# Patient Record
Sex: Male | Born: 1935
Health system: Southern US, Community
[De-identification: ages and names within clinical notes are randomized; demographics above are authoritative.]

## PROBLEM LIST (undated history)

## (undated) DIAGNOSIS — C801 Malignant (primary) neoplasm, unspecified: Secondary | ICD-10-CM

## (undated) DIAGNOSIS — R0600 Dyspnea, unspecified: Secondary | ICD-10-CM

## (undated) DIAGNOSIS — N189 Chronic kidney disease, unspecified: Secondary | ICD-10-CM

## (undated) DIAGNOSIS — M1990 Unspecified osteoarthritis, unspecified site: Secondary | ICD-10-CM

## (undated) DIAGNOSIS — I2510 Atherosclerotic heart disease of native coronary artery without angina pectoris: Secondary | ICD-10-CM

## (undated) DIAGNOSIS — F32A Depression, unspecified: Secondary | ICD-10-CM

## (undated) DIAGNOSIS — I10 Essential (primary) hypertension: Secondary | ICD-10-CM

## (undated) DIAGNOSIS — D649 Anemia, unspecified: Secondary | ICD-10-CM

## (undated) DIAGNOSIS — T148XXA Other injury of unspecified body region, initial encounter: Secondary | ICD-10-CM

## (undated) DIAGNOSIS — F419 Anxiety disorder, unspecified: Secondary | ICD-10-CM

## (undated) HISTORY — PX: CHOLECYSTECTOMY: SHX55

## (undated) HISTORY — PX: CORONARY ARTERY BYPASS GRAFT: SHX141

## (undated) HISTORY — PX: OTHER SURGICAL HISTORY: SHX169

## (undated) HISTORY — PX: PROSTATECTOMY: SHX69

---

## 2006-01-26 ENCOUNTER — Inpatient Hospital Stay (HOSPITAL_COMMUNITY)
Admission: AD | Admit: 2006-01-26 | Discharge: 2006-02-02 | Payer: Self-pay | Admitting: Thoracic Surgery (Cardiothoracic Vascular Surgery)

## 2016-02-28 HISTORY — PX: EYE SURGERY: SHX253

## 2016-09-27 ENCOUNTER — Other Ambulatory Visit (HOSPITAL_COMMUNITY): Payer: Self-pay | Admitting: Urology

## 2016-09-27 ENCOUNTER — Ambulatory Visit (HOSPITAL_COMMUNITY)
Admission: RE | Admit: 2016-09-27 | Discharge: 2016-09-27 | Disposition: A | Discharge status: Home / Self Care | Payer: Medicare Other | Source: Ambulatory Visit | Attending: Urology | Admitting: Urology

## 2016-09-27 DIAGNOSIS — D49511 Neoplasm of unspecified behavior of right kidney: Secondary | ICD-10-CM

## 2016-09-27 DIAGNOSIS — I700 Atherosclerosis of aorta: Secondary | ICD-10-CM | POA: Insufficient documentation

## 2016-09-29 ENCOUNTER — Other Ambulatory Visit: Payer: Self-pay | Admitting: Urology

## 2016-10-13 NOTE — Patient Instructions (Signed)
Wesley Snyder.  10/13/2016   Your procedure is scheduled on: 10/23/16  Report to [facility] Main  Entrance Take [place]  elevators to 3rd floor to  [place] at     1000 AM.    Call this number if you have problems the morning of surgery [phone]    Remember: ONLY 1 PERSON MAY GO WITH YOU TO SHORT STAY TO GET  READY MORNING OF Wesley Snyder.  Do not eat food or drink liquids :After Midnight.     Take these medicines the morning of surgery with A SIP OF WATER: none DO NOT TAKE ANY DIABETIC MEDICATIONS DAY OF YOUR SURGERY                               You may not have any metal on your body including hair pins and              piercings  Do not wear jewelry,  lotions, powders or perfumes, deodorant                         Men may shave face and neck.   Do not bring valuables to the hospital. [place].  Contacts, dentures or bridgework may not be worn into surgery.      Patients discharged the day of surgery will not be allowed to drive home.  Name and phone number of your driver:  Special Instructions: N/A              Please read over the following fact sheets you were given: _____________________________________________________________________            [facility] - Preparing for Surgery Before surgery, you can play an important role.  Because skin is not sterile, your skin needs to be as free of germs as possible.  You can reduce the number of germs on your skin by washing with CHG (chlorahexidine gluconate) soap before surgery.  CHG is an antiseptic cleaner which kills germs and bonds with the skin to continue killing germs even after washing. Please DO NOT use if you have an allergy to CHG or antibacterial soaps.  If your skin becomes reddened/irritated stop using the CHG and inform your nurse when you arrive at Short Stay. Do not shave (including legs and underarms) for at least 48 hours  prior to the first CHG shower.  You may shave your face/neck. Please follow these instructions carefully:  1.  Shower with CHG Soap the night before surgery and the  morning of Surgery.  2.  If you choose to wash your hair, wash your hair first as usual with your  normal  shampoo.  3.  After you shampoo, rinse your hair and body thoroughly to remove the  shampoo.                           4.  Use CHG as you would any other liquid soap.  You can apply chg directly  to the skin and wash                       Gently with a  scrungie or clean washcloth.  5.  Apply the CHG Soap to your body ONLY FROM THE NECK DOWN.   Do not use on face/ open                           Wound or open sores. Avoid contact with eyes, ears mouth and genitals (private parts).                       Wash face,  Genitals (private parts) with your normal soap.             6.  Wash thoroughly, paying special attention to the area where your surgery  will be performed.  7.  Thoroughly rinse your body with warm water from the neck down.  8.  DO NOT shower/wash with your normal soap after using and rinsing off  the CHG Soap.                9.  Pat yourself dry with a clean towel.            10.  Wear clean pajamas.            11.  Place clean sheets on your bed the night of your first shower and do not  sleep with pets. Day of Surgery : Do not apply any lotions/deodorants the morning of surgery.  Please wear clean clothes to the hospital/surgery center.  FAILURE TO FOLLOW THESE INSTRUCTIONS MAY RESULT IN THE CANCELLATION OF YOUR SURGERY PATIENT SIGNATURE_________________________________  NURSE SIGNATURE__________________________________  ________________________________________________________________________

## 2016-10-16 ENCOUNTER — Encounter (HOSPITAL_COMMUNITY)
Admission: RE | Admit: 2016-10-16 | Discharge: 2016-10-16 | Disposition: A | Discharge status: Home / Self Care | Payer: Medicare Other | Source: Ambulatory Visit | Attending: Urology | Admitting: Urology

## 2016-10-16 ENCOUNTER — Encounter (INDEPENDENT_AMBULATORY_CARE_PROVIDER_SITE_OTHER): Payer: Self-pay

## 2016-10-16 ENCOUNTER — Encounter (HOSPITAL_COMMUNITY): Payer: Self-pay

## 2016-10-16 DIAGNOSIS — Z01812 Encounter for preprocedural laboratory examination: Secondary | ICD-10-CM | POA: Insufficient documentation

## 2016-10-16 HISTORY — DX: Malignant (primary) neoplasm, unspecified: C80.1

## 2016-10-16 HISTORY — DX: Anxiety disorder, unspecified: F41.9

## 2016-10-16 HISTORY — DX: Essential (primary) hypertension: I10

## 2016-10-16 LAB — CBC
HCT: 41.8 % (ref 39.0–52.0)
Hemoglobin: 14.5 g/dL (ref 13.0–17.0)
MCH: 30.7 pg (ref 26.0–34.0)
MCHC: 34.7 g/dL (ref 30.0–36.0)
MCV: 88.6 fL (ref 78.0–100.0)
PLATELETS: 135 10*3/uL — AB (ref 150–400)
RBC: 4.72 MIL/uL (ref 4.22–5.81)
RDW: 13 % (ref 11.5–15.5)
WBC: 7 10*3/uL (ref 4.0–10.5)

## 2016-10-16 LAB — BASIC METABOLIC PANEL
Anion gap: 5 (ref 5–15)
BUN: 21 mg/dL — ABNORMAL HIGH (ref 6–20)
CALCIUM: 9.5 mg/dL (ref 8.9–10.3)
CO2: 25 mmol/L (ref 22–32)
CREATININE: 1.09 mg/dL (ref 0.61–1.24)
Chloride: 111 mmol/L (ref 101–111)
GFR calc non Af Amer: >60 mL/min (ref >60)
GLUCOSE: 93 mg/dL (ref 65–99)
Potassium: 4.4 mmol/L (ref 3.5–5.1)
Sodium: 141 mmol/L (ref 135–145)

## 2016-10-16 NOTE — Progress Notes (Signed)
Cbc done 10/16/16  routed to Dr. Alinda Money via epic

## 2016-10-16 NOTE — Progress Notes (Signed)
cxr 09/30/16 epic

## 2016-10-16 NOTE — Progress Notes (Signed)
Spoke with Wesley Snyder At Dr. Barnabas Lister painters office   [phone].  pt. Is to have a Left heart cath on 10/17/16 @ 1200 noon per Wesley Snyder she will fax results to [phone] . Pt. Surgery pending results of cath.

## 2016-10-18 NOTE — Progress Notes (Signed)
EKG-10/12/16- on chart from office of Dr Sharyon Cable and  Clearance note on chart from Dr Sharyon Cable and stated results of cardiac cath done on 10/17/2016 on clearance.

## 2016-10-20 NOTE — Progress Notes (Signed)
Called and spoke to wife to arrive in short stay at 9:30am use [employer] parking

## 2016-10-20 NOTE — H&P (Signed)
Office Visit Report     09/27/2016   --------------------------------------------------------------------------------   Wesley Snyder  MRN: [id number]  PRIMARY CARE:    DOB: 1932/08/27, 83 year old Male  REFERRING:  Aloha Gell. Hurt, MD  SSN: -[id number]  PROVIDER:  Raynelle Bring, M.D.    LOCATION:  [facility] 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 Roxanne Gates. His urologic history is significant for renal cell carcinoma status post left open radical nephrectomy at [facility] in 1994. He has not had any recurrences. He also has a history of prostate cancer status post a radical retropubic prostatectomy at [facility] in 1994 as well. Apparently, he has been on androgen deprivation therapy for biochemical recurrence for over 20 years. He received his last Lupron injection on 09/01/16.   More recently, he developed gross hematuria in June after he fell and landed on his right flank. His hematuria occurred 3 days after his fall. This was painless hematuria. He has denied any right sided flank pain, fevers, weight loss, or other complaints. His hematuria has now resolved.   He underwent an evaluation by Dr. hurt after a CT scan was performed and presumably demonstrated a questionable right renal mass. He therefore was scheduled for an MRI of the abdomen without contrast and this was performed on 09/12/16. These films were available for me to independently reviewed today and do indeed indicate a solitary right kidney with multiple small right renal cyst. There is an area measuring approximately 18 mm that appears solid and likely within the upper pole of the right kidney. It is difficult to further characterize this lesion considering the absence of IV contrast. The patient does not give a history of  chronic kidney disease but I also do not have any laboratory studies to objectively assess his renal function.   His past medical history is significant for history of coronary artery disease status post coronary artery bypass grafting in 2006. He apparently has not been followed by a cardiologist for many years. He denies any chest pain, shortness of breath, or palpitations recently.     ALLERGIES: None   MEDICATIONS: None   GU PSH: None   NON-GU PSH: Coronary Artery Bypass Grafting    GU PMH: None   NON-GU PMH: Heart disease, unspecified Sleep Apnea    FAMILY HISTORY: 1 Daughter - Runs in Family 2 sons - Runs in Family Cirrhosis - Father   SOCIAL HISTORY: Marital Status: Married Preferred Language: English; Ethnicity: Not Hispanic Or Latino; Race: White Current Smoking Status: Patient has never smoked.   Tobacco Use Assessment Completed: Used Tobacco in last 30 days? Has never drank.  Drinks 1 caffeinated drink per day.    REVIEW OF SYSTEMS:    GU Review Male:   Patient reports frequent urination, hard to postpone urination, get up at night to urinate, and leakage of urine. Patient denies burning/ pain with urination, stream starts and stops, trouble starting your streams, and have to strain to urinate .  Gastrointestinal (Lower):   Patient reports diarrhea. Patient denies constipation.  Gastrointestinal (Upper):   Patient denies nausea and vomiting.  Constitutional:   Patient reports weight loss. Patient denies fever, night sweats, and fatigue.  Skin:   Patient denies skin rash/ lesion and itching.  Eyes:   Patient reports blurred vision. Patient denies double vision.  Ears/  Nose/ Throat:   Patient denies sore throat and sinus problems.  Hematologic/Lymphatic:   Patient reports easy bruising. Patient denies swollen glands.  Cardiovascular:   Patient denies leg swelling and chest pains.  Respiratory:   Patient denies cough and shortness of breath.  Endocrine:   Patient  denies excessive thirst.  Musculoskeletal:   Patient reports joint pain. Patient denies back pain.  Neurological:   Patient denies headaches and dizziness.  Psychologic:   Patient denies anxiety and depression.   VITAL SIGNS:      09/27/2016 09:54 AM  Weight 180 lb / 81.65 kg  Height 71 in / 180.34 cm  BP 147/76 mmHg  Pulse 54 /min  BMI 25.1 kg/m   MULTI-SYSTEM PHYSICAL EXAMINATION:    Constitutional: Well-nourished. No physical deformities. Normally developed. Good grooming.  Neck: Neck symmetrical, not swollen. Normal tracheal position.  Respiratory: No labored breathing, no use of accessory muscles. Clear bilaterally.  Cardiovascular: Normal temperature, normal extremity pulses, no swelling, no varicosities. Regular rate and rhythm.  Lymphatic: No enlargement of neck, axillae, groin.  Skin: No paleness, no jaundice, no cyanosis. No lesion, no ulcer, no rash.  Neurologic / Psychiatric: Oriented to time, oriented to place, oriented to person. No depression, no anxiety, no agitation.  Gastrointestinal: No mass, no tenderness, no rigidity, non obese abdomen.  Eyes: Normal conjunctivae. Normal eyelids.  Ears, Nose, Mouth, and Throat: Left ear no scars, no lesions, no masses. Right ear no scars, no lesions, no masses. Nose no scars, no lesions, no masses. Normal hearing. Normal lips.  Musculoskeletal: Normal gait and station of head and neck.     PAST DATA REVIEWED:  Source Of History:  Patient  Records Review:   Previous Patient Records  Urine Test Review:   Urinalysis  X-Ray Review: C.T. Abdomen/Pelvis: Reviewed Report.  MRI Abdomen: Reviewed Films.     PROCEDURES:          Urinalysis w/Scope Dipstick Dipstick Cont'd Micro  Color: Straw Bilirubin: Neg WBC/hpf: NS (Not Seen)  Appearance: Cloudy Ketones: Neg RBC/hpf: 40 - 60/hpf  Specific Gravity: 1.010 Blood: 3+ Bacteria: NS (Not Seen)  pH: 6.0 Protein: Neg Cystals: NS (Not Seen)  Glucose: Neg Urobilinogen: 0.2 Casts: NS  (Not Seen)    Nitrites: Neg Trichomonas: Not Present    Leukocyte Esterase: Neg Mucous: Present      Epithelial Cells: 0 - 5/hpf      Yeast: NS (Not Seen)      Sperm: Not Present    ASSESSMENT:      ICD-10 Details  1 GU:   Right renal neoplasm - D49.511   2   Gross hematuria - R31.0   3   Prostate Cancer - C61   4   History of kidney cancer - Z85.528    PLAN:           Orders Labs CMP, PSA  X-Rays: Chest X-Ray Outside.          Schedule Return Visit/Planned Activity: Other See Visit Notes             Note: Will call to schedule surgery          Document Letter(s):  Created for Patient: Clinical Summary         Notes:   1. Gross hematuria/right renal neoplasm: I have recommended that he proceed with cystoscopy under anesthesia with right retrograde pyelography, right ureteroscopy, possible biopsy, possible laser ablation of right renal pelvic tumor, and ureteral stent placement. We discussed this  procedure in detail including the potential risks, complications, and expected recovery process. He gives informed consent to proceed.   His renal function will be checked today. He also will have a chest x-ray performed to evaluate for possible metastatic disease.   Considering his solitary right kidney, he understands we will do everything we can to proceed with renal sparing therapy.   2. Prostate cancer: It is unclear to me the reason that he is been on androgen deprivation therapy for close to 20 years. However, I do not have any of his records regarding his prostate cancer. Regardless, he did receive androgen deprivation therapy as recently as early July. He will continue his care under Dr. Lerry Liner.   3. Renal cell carcinoma: He has not had any evidence of recurrence.   Cc: Majel Homer, PA-C  Dr. Alain Marion    * Signed by Raynelle Bring, M.D. on 09/27/16 at 6:58 PM (EDT)*

## 2016-10-23 ENCOUNTER — Ambulatory Visit (HOSPITAL_COMMUNITY): Payer: Medicare Other | Admitting: Anesthesiology

## 2016-10-23 ENCOUNTER — Encounter (HOSPITAL_COMMUNITY)
Admission: RE | Disposition: A | Discharge status: Home / Self Care | Payer: Self-pay | Source: Ambulatory Visit | Attending: Urology

## 2016-10-23 ENCOUNTER — Encounter (HOSPITAL_COMMUNITY): Payer: Self-pay | Admitting: *Deleted

## 2016-10-23 ENCOUNTER — Ambulatory Visit (HOSPITAL_COMMUNITY)
Admission: RE | Admit: 2016-10-23 | Discharge: 2016-10-23 | Disposition: A | Discharge status: Home / Self Care | Payer: Medicare Other | Source: Ambulatory Visit | Attending: Urology | Admitting: Urology

## 2016-10-23 ENCOUNTER — Ambulatory Visit (HOSPITAL_COMMUNITY): Payer: Medicare Other

## 2016-10-23 DIAGNOSIS — G4730 Sleep apnea, unspecified: Secondary | ICD-10-CM | POA: Diagnosis not present

## 2016-10-23 DIAGNOSIS — Z87891 Personal history of nicotine dependence: Secondary | ICD-10-CM | POA: Insufficient documentation

## 2016-10-23 DIAGNOSIS — C651 Malignant neoplasm of right renal pelvis: Secondary | ICD-10-CM | POA: Diagnosis not present

## 2016-10-23 DIAGNOSIS — Z79899 Other long term (current) drug therapy: Secondary | ICD-10-CM | POA: Diagnosis not present

## 2016-10-23 DIAGNOSIS — I10 Essential (primary) hypertension: Secondary | ICD-10-CM | POA: Diagnosis not present

## 2016-10-23 DIAGNOSIS — I2510 Atherosclerotic heart disease of native coronary artery without angina pectoris: Secondary | ICD-10-CM | POA: Insufficient documentation

## 2016-10-23 DIAGNOSIS — Z951 Presence of aortocoronary bypass graft: Secondary | ICD-10-CM | POA: Insufficient documentation

## 2016-10-23 DIAGNOSIS — R310 Gross hematuria: Secondary | ICD-10-CM | POA: Diagnosis not present

## 2016-10-23 DIAGNOSIS — Z85528 Personal history of other malignant neoplasm of kidney: Secondary | ICD-10-CM | POA: Insufficient documentation

## 2016-10-23 DIAGNOSIS — Z8546 Personal history of malignant neoplasm of prostate: Secondary | ICD-10-CM | POA: Diagnosis not present

## 2016-10-23 HISTORY — PX: CYSTOSCOPY/RETROGRADE/URETEROSCOPY: SHX5316

## 2016-10-23 SURGERY — CYSTOSCOPY/RETROGRADE/URETEROSCOPY
Anesthesia: General | Laterality: Right

## 2016-10-23 MED ORDER — LACTATED RINGERS IV SOLN
INTRAVENOUS | Status: DC
  Administered 2016-10-23: 10:00:00 via INTRAVENOUS

## 2016-10-23 MED ORDER — LIDOCAINE 2% (20 MG/ML) 5 ML SYRINGE
INTRAMUSCULAR | Status: AC
  Filled 2016-10-23: qty 5

## 2016-10-23 MED ORDER — ONDANSETRON HCL 4 MG/2ML IJ SOLN
INTRAMUSCULAR | Status: AC
  Filled 2016-10-23: qty 2

## 2016-10-23 MED ORDER — PROPOFOL 10 MG/ML IV BOLUS
INTRAVENOUS | Status: DC | PRN
  Administered 2016-10-23: 170 mg via INTRAVENOUS

## 2016-10-23 MED ORDER — LIDOCAINE 2% (20 MG/ML) 5 ML SYRINGE
INTRAMUSCULAR | Status: DC | PRN
  Administered 2016-10-23: 40 mg via INTRAVENOUS

## 2016-10-23 MED ORDER — HYDROCODONE-ACETAMINOPHEN 5-325 MG PO TABS: 1.0000 | ORAL_TABLET | Freq: Four times a day (QID) | ORAL | 0 refills | Status: DC | PRN

## 2016-10-23 MED ORDER — PROPOFOL 10 MG/ML IV BOLUS
INTRAVENOUS | Status: AC
  Filled 2016-10-23: qty 20

## 2016-10-23 MED ORDER — LACTATED RINGERS IV SOLN: INTRAVENOUS | Status: DC

## 2016-10-23 MED ORDER — FENTANYL CITRATE (PF) 100 MCG/2ML IJ SOLN
INTRAMUSCULAR | Status: AC
  Filled 2016-10-23: qty 2

## 2016-10-23 MED ORDER — DEXAMETHASONE SODIUM PHOSPHATE 10 MG/ML IJ SOLN
INTRAMUSCULAR | Status: AC
  Filled 2016-10-23: qty 1

## 2016-10-23 MED ORDER — FENTANYL CITRATE (PF) 100 MCG/2ML IJ SOLN: 25.0000 ug | INTRAMUSCULAR | Status: DC | PRN

## 2016-10-23 MED ORDER — SULFAMETHOXAZOLE-TRIMETHOPRIM 800-160 MG PO TABS: 1.0000 | ORAL_TABLET | Freq: Two times a day (BID) | ORAL | 0 refills | Status: DC

## 2016-10-23 MED ORDER — SODIUM CHLORIDE 0.9 % IR SOLN
Status: DC | PRN
  Administered 2016-10-23: 6000 mL

## 2016-10-23 MED ORDER — EPHEDRINE 5 MG/ML INJ
INTRAVENOUS | Status: AC
  Filled 2016-10-23: qty 10

## 2016-10-23 MED ORDER — DEXAMETHASONE SODIUM PHOSPHATE 10 MG/ML IJ SOLN
INTRAMUSCULAR | Status: DC | PRN
Start: 2016-10-23 — End: 2016-10-23
  Administered 2016-10-23: 10 mg via INTRAVENOUS

## 2016-10-23 MED ORDER — EPHEDRINE SULFATE-NACL 50-0.9 MG/10ML-% IV SOSY
PREFILLED_SYRINGE | INTRAVENOUS | Status: DC | PRN
  Administered 2016-10-23: 20 mg via INTRAVENOUS

## 2016-10-23 MED ORDER — FENTANYL CITRATE (PF) 100 MCG/2ML IJ SOLN
INTRAMUSCULAR | Status: DC | PRN
  Administered 2016-10-23 (×2): 25 ug via INTRAVENOUS
  Administered 2016-10-23: 50 ug via INTRAVENOUS

## 2016-10-23 MED ORDER — PHENAZOPYRIDINE HCL 100 MG PO TABS: 100.0000 mg | ORAL_TABLET | Freq: Three times a day (TID) | ORAL | 0 refills | Status: DC | PRN

## 2016-10-23 MED ORDER — PROMETHAZINE HCL 25 MG/ML IJ SOLN: 6.2500 mg | INTRAMUSCULAR | Status: DC | PRN

## 2016-10-23 MED ORDER — CEFAZOLIN SODIUM-DEXTROSE 2-4 GM/100ML-% IV SOLN
2.0000 g | INTRAVENOUS | Status: AC
  Administered 2016-10-23: 2 g via INTRAVENOUS
  Filled 2016-10-23: qty 100

## 2016-10-23 MED ORDER — ONDANSETRON HCL 4 MG/2ML IJ SOLN
INTRAMUSCULAR | Status: DC | PRN
Start: 2016-10-23 — End: 2016-10-23
  Administered 2016-10-23: 4 mg via INTRAVENOUS

## 2016-10-23 SURGICAL SUPPLY — 20 items
BAG URO CATCHER STRL LF (MISCELLANEOUS) ×3 IMPLANT
BASKET STNLS GEMINI 4WIRE 3FR (BASKET) ×3 IMPLANT
BSKT STON RTRVL GEM 120X11 3FR (BASKET) ×1
CATH INTERMIT  6FR 70CM (CATHETERS) ×3 IMPLANT
CLOTH BEACON ORANGE TIMEOUT ST (SAFETY) ×3 IMPLANT
COVER FOOT SWITCH UNIV DISP (DRAPES) IMPLANT
COVER SURGICAL LIGHT HANDLE (MISCELLANEOUS) ×3 IMPLANT
FIBER LASER TRAC TIP (UROLOGICAL SUPPLIES) ×2 IMPLANT
GLOVE BIOGEL M STRL SZ7.5 (GLOVE) ×3 IMPLANT
GOWN STRL REUS W/TWL LRG LVL3 (GOWN DISPOSABLE) ×6 IMPLANT
GUIDEWIRE ANG ZIPWIRE 038X150 (WIRE) ×2 IMPLANT
GUIDEWIRE STR DUAL SENSOR (WIRE) ×3 IMPLANT
IV NS 1000ML (IV SOLUTION) ×3
IV NS 1000ML BAXH (IV SOLUTION) ×1 IMPLANT
MANIFOLD NEPTUNE II (INSTRUMENTS) ×3 IMPLANT
PACK CYSTO (CUSTOM PROCEDURE TRAY) ×3 IMPLANT
SHEATH ACCESS URETERAL 38CM (SHEATH) ×2 IMPLANT
STENT CONTOUR 6FRX26X.038 (STENTS) ×3 IMPLANT
TUBING CONNECTING 10 (TUBING) ×2 IMPLANT
TUBING CONNECTING 10' (TUBING) ×1

## 2016-10-23 NOTE — Interval H&P Note (Signed)
History and Physical Interval Note:  10/23/2016 10:26 AM  Wesley Snyder.  has presented today for surgery, with the diagnosis of RIGHT RENAL NEOPLASM, HEMATURIA  The various methods of treatment have been discussed with the patient and family. After consideration of risks, benefits and other options for treatment, the patient has consented to  Procedure(s): CYSTOSCOPY/RETROGRADE/URETEROSCOPY/STENT/ BIOPSY/ HOLMIUM LASER/ ABLATION OF TUMOR (Right) as a surgical intervention .  The patient's history has been reviewed, patient examined, no change in status, stable for surgery.  I have reviewed the patient's chart and labs.  Questions were answered to the patient's satisfaction.     Mackena Plummer,LES

## 2016-10-23 NOTE — Transfer of Care (Signed)
Immediate Anesthesia Transfer of Care Note  Patient: Wesley Snyder.  Procedure(s) Performed: Procedure(s): CYSTOSCOPY/RETROGRADE/URETEROSCOPY/STENT/ BIOPSY/ HOLMIUM LASER/ ABLATION OF TUMOR (Right)  Patient Location: PACU  Anesthesia Type:General  Level of Consciousness:  sedated, patient cooperative and responds to stimulation  Airway & Oxygen Therapy:Patient Spontanous Breathing and Patient connected to face mask oxgen  Post-op Assessment:  Report given to PACU RN and Post -op Vital signs reviewed and stable  Post vital signs:  Reviewed and stable  Last Vitals:  Vitals:   10/23/16 0913  BP: (!) 162/73  Pulse: (!) 58  Resp: 16  Temp: 36.6 C  SpO2: 10%    Complications: No apparent anesthesia complications

## 2016-10-23 NOTE — Anesthesia Procedure Notes (Signed)
Procedure Name: LMA Insertion Date/Time: 10/23/2016 10:54 AM Performed by: Talbot Grumbling Pre-anesthesia Checklist: Emergency Drugs available, Suction available, Patient being monitored and Patient identified Patient Re-evaluated:Patient Re-evaluated prior to induction Oxygen Delivery Method: Circle system utilized Preoxygenation: Pre-oxygenation with 100% oxygen Induction Type: IV induction Ventilation: Mask ventilation without difficulty LMA: LMA inserted LMA Size: 4.0 Number of attempts: 1 Placement Confirmation: positive ETCO2 and breath sounds checked- equal and bilateral Tube secured with: Tape Dental Injury: Teeth and Oropharynx as per pre-operative assessment

## 2016-10-23 NOTE — Anesthesia Preprocedure Evaluation (Addendum)
Anesthesia Evaluation  Patient identified by MRN, date of birth, ID band Patient awake    Reviewed: Allergy & Precautions, NPO status , Patient's Chart, lab work & pertinent test results  Airway Mallampati: II  TM Distance: >3 FB Neck ROM: Full    Dental no notable dental hx.    Pulmonary sleep apnea , former smoker,    Pulmonary exam normal breath sounds clear to auscultation       Cardiovascular hypertension, + CAD and + CABG  Normal cardiovascular exam Rhythm:Regular Rate:Normal     Neuro/Psych negative neurological ROS  negative psych ROS   GI/Hepatic negative GI ROS, Neg liver ROS,   Endo/Other  negative endocrine ROS  Renal/GU negative Renal ROS  negative genitourinary   Musculoskeletal negative musculoskeletal ROS (+)   Abdominal   Peds negative pediatric ROS (+)  Hematology negative hematology ROS (+)   Anesthesia Other Findings   Reproductive/Obstetrics negative OB ROS                             Anesthesia Physical Anesthesia Plan  ASA: III  Anesthesia Plan: General   Post-op Pain Management:    Induction: Intravenous  PONV Risk Score and Plan: 2 and Ondansetron and Dexamethasone  Airway Management Planned: LMA  Additional Equipment:   Intra-op Plan:   Post-operative Plan: Extubation in OR  Informed Consent: I have reviewed the patients History and Physical, chart, labs and discussed the procedure including the risks, benefits and alternatives for the proposed anesthesia with the patient or authorized representative who has indicated his/her understanding and acceptance.   Dental advisory given  Plan Discussed with: CRNA and Surgeon  Anesthesia Plan Comments:        Anesthesia Quick Evaluation

## 2016-10-23 NOTE — Discharge Instructions (Signed)

## 2016-10-23 NOTE — Anesthesia Postprocedure Evaluation (Signed)
Anesthesia Post Note  Patient: Wesley Snyder.  Procedure(s) Performed: Procedure(s) (LRB): CYSTOSCOPY/RETROGRADE/URETEROSCOPY/STENT/ BIOPSY/ HOLMIUM LASER/ ABLATION OF TUMOR (Right)     Patient location during evaluation: PACU Anesthesia Type: General Level of consciousness: awake and alert Pain management: pain level controlled Vital Signs Assessment: post-procedure vital signs reviewed and stable Respiratory status: spontaneous breathing, nonlabored ventilation, respiratory function stable and patient connected to nasal cannula oxygen Cardiovascular status: blood pressure returned to baseline and stable Postop Assessment: no signs of nausea or vomiting Anesthetic complications: no    Last Vitals:  Vitals:   10/23/16 1300 10/23/16 1316  BP: (!) 145/72 (!) 145/93  Pulse: 69 67  Resp: 18 16  Temp:  36.4 C  SpO2: 96% 98%    Last Pain:  Vitals:   10/23/16 0913  TempSrc: Oral                 Anntionette Madkins S

## 2016-10-23 NOTE — Op Note (Signed)
Preoperative diagnosis: Right renal pelvic tumor and hematuria  Postoperative diagnosis: Right renal pelvic tumor and hematuria  Procedures: 1.  Cystoscopy 2.  Right retrograde pyelography with interpretation 3.  Right ureteroscopy 4.  Biopsy of right renal pelvic tumor 5.  Holmium laser ablation of right renal pelvic tumor 6.  Right ureteral stent placement (6 x 26 - no string)  Surgeon: Pryor Curia. M.D.  Anesthesia: General  Complications: None  EBL: Minimal  Specimens: 1.  Right renal pelvic cytology 2.  Biopsy of right renal pelvic tumor 3.  Right renal pelvic tumor (2nd specimen)  Disposition of specimens: To pathology  Intraoperative findings: Right retrograde pyelography was performed with a 6 French ureteral catheter and Omnipaque contrast.  This revealed a normal caliber ureter without ureteral filling defects.  The right renal collecting system was significant for an amputated upper pole calyx concerning for possible tumor involvement.  Indication: Wesley Snyder is an 83 year old gentleman with a right solitary kidney status post left nephrectomy in the distant past for renal cell carcinoma.  He recently developed hematuria and had an MRI performed demonstrated a questionable mass in the upper pole of the right renal collecting system.  He presents today for further evaluation and the above procedures.  The potential risks, complications, and expected recovery process associated with these procedures has been discussed in detail.  He gives informed consent to proceed.  Description of procedure: The patient was taken to the operating room and a general anesthetic was administered.  He was given preoperative antibiotics, placed in the dorsal lithotomy position, and prepped and draped in the usual sterile fashion.  Next, a preoperative timeout was performed.  Cystourethroscopy was performed.  The prostatic urethra was surgically absent with his history of radical  prostatectomy.  Inspection of the bladder revealed no obvious tumors, stones, or other mucosal pathology.  Attention then turned to the right ureteral orifice and this was intubated with a 6 French ureteral catheter.  Omnipaque contrast was injected that revealed normal caliber ureter without filling defects or other abnormalities.  However, the right upper pole calyx was amputated concerning for possible tumor.  A 0.038 Sensor guidewire was then advanced up the right ureter into the right renal collecting system under fluoroscopic guidance.  A 12/14 ureteral access sheath was then advanced over the wire up into the proximal ureter.  Digital flexible ureteroscopy was then performed and the entire proximal ureter and renal collecting system were carefully examined.  There was noted to be an obvious papillary tumor in the upper pole of the right kidney.  A Gemini basket was placed through the ureteroscope and 2 biopsy specimens were obtained and sent for permanent analysis.  A saline washing was also obtained for cytology.  Using a 200  holmium laser fiber, laser ablation of the tumor was then performed.  I had discussed this possibility prior to the procedure with the patient.  Although we did not have definite pathology confirmation whether this was a high-grade or low-grade tumor, considering his solitary kidney and advanced age, he adamantly did not wish to proceed with nephroureterectomy which would result in end-stage renal disease and necessitate dialysis.  He therefore wished to proceed with local endoscopic therapy if possible.  The majority of the tumor was therefore laser ablated on a setting of 1 J and 6 Hz.  once the tumor had been ablated to the point where visualization became difficult, the procedure was ended.  The ureteral access sheath was removed  and the wire was left in place.  The wire was then back loaded over the cystoscope and a 6 x 26 double-J ureteral stent was advanced under fluoroscopic  guidance and positioned properly under fluoroscopy and cystoscopy.  The patient's bladder was ended and the procedure was completed.  He tolerated the procedure well without complications.  He was able to be awakened and transferred to the recovery unit in satisfactory condition.

## 2016-11-13 ENCOUNTER — Other Ambulatory Visit: Payer: Self-pay | Admitting: Urology

## 2016-11-14 ENCOUNTER — Encounter (HOSPITAL_COMMUNITY): Payer: Self-pay | Admitting: *Deleted

## 2016-11-15 NOTE — H&P (Signed)
Office Visit Report     11/07/2016   --------------------------------------------------------------------------------   Wesley Snyder  MRN: [id number]  PRIMARY CARE:    DOB: November 08, 1932, 83 year old Male  REFERRING:  Luvenia Redden  SSN: -[id number]  PROVIDER:  Raynelle Bring, M.D.    LOCATION:  [facility] [phone]   --------------------------------------------------------------------------------   CC/HPI: High-grade urothelial carcinoma of the right renal pelvis   [name] follows up today after his recent ureteroscopy. He was confirmed to have a papillary tumor in the upper pole of the right kidney urothelium. His biopsy results indicated a high-grade urothelial carcinoma. He did undergo ureteroscopic laser ablation of this tumor during that procedure with the goal of renal preservation considering a solitary right kidney and history of left nephrectomy. He states that he has recovered well. He denies any gross hematuria. He has been tolerating his right ureteral stent well.     ALLERGIES: None   MEDICATIONS: None   GU PSH: Cysto Uretero W/excise Tumor, Right - 10/23/2016 Cystoscopy Insert Stent, Right - 10/23/2016    NON-GU PSH: Coronary Artery Bypass Grafting    GU PMH: Gross hematuria - 09/27/2016 History of kidney cancer - 09/27/2016 Prostate Cancer - 09/27/2016 Right renal neoplasm - 09/27/2016      PMH Notes:   1) Renal cell carcinoma: He is s/p an open left radical nephrectomy at [facility] in 1994.   2) Prostate cancer: He is s/p an open RRP at [facility] in 1994. He apparently had a biochemical recurrence soon after primary therapy and has been on systemic therapy with ADT since then under his urologist in [place].   3) Urothelial carcinoma of the right kidney: He was found to have a right renal collecting system tumor on imaging in 2018 in his solitary right kidney. This was confirmed on ureteroscopic evaluation to be high grade  urothelial carcinoma.      NON-GU PMH: Coronary Artery Disease Sleep Apnea    FAMILY HISTORY: 1 Daughter - Runs in Family 2 sons - Runs in Family Cirrhosis - Father   SOCIAL HISTORY: Marital Status: Married Preferred Language: English; Ethnicity: Not Hispanic Or Latino; Race: White Current Smoking Status: Patient has never smoked.   Tobacco Use Assessment Completed: Used Tobacco in last 30 days? Has never drank.  Drinks 1 caffeinated drink per day.    REVIEW OF SYSTEMS:    GU Review Male:   Patient reports frequent urination, get up at night to urinate, and leakage of urine. Patient denies hard to postpone urination, burning/ pain with urination, stream starts and stops, trouble starting your streams, and have to strain to urinate .  Gastrointestinal (Lower):   Patient reports diarrhea. Patient denies constipation.  Gastrointestinal (Upper):   Patient denies nausea and vomiting.  Constitutional:   Patient denies fever, night sweats, weight loss, and fatigue.  Skin:   Patient denies skin rash/ lesion and itching.  Eyes:   Patient denies double vision and blurred vision.  Ears/ Nose/ Throat:   Patient denies sore throat and sinus problems.  Hematologic/Lymphatic:   Patient reports easy bruising. Patient denies swollen glands.  Cardiovascular:   Patient denies leg swelling and chest pains.  Respiratory:   Patient denies cough and shortness of breath.  Endocrine:   Patient denies excessive thirst.  Musculoskeletal:   Patient denies back pain and joint pain.  Neurological:   Patient denies headaches and dizziness.  Psychologic:   Patient denies depression and anxiety.  VITAL SIGNS:      11/07/2016 10:41 AM  Weight 175 lb / 79.38 kg  Height 71 in / 180.34 cm  BP 156/76 mmHg  Pulse 55 /min  BMI 24.4 kg/m   MULTI-SYSTEM PHYSICAL EXAMINATION:    Constitutional: Well-nourished. No physical deformities. Normally developed. Good grooming.  Respiratory: No labored breathing, no  use of accessory muscles. Clear bilaterally.  Cardiovascular: Normal temperature, normal extremity pulses, no swelling, no varicosities. Regular rate and rhythm.  Neurologic / Psychiatric: Oriented to time, oriented to place, oriented to person. No depression, no anxiety, no agitation.  Gastrointestinal: No mass, no tenderness, no rigidity, non obese abdomen. No CVA tenderness.     PAST DATA REVIEWED:  Source Of History:  Patient  Records Review:   Pathology Reports  Urine Test Review:   Urinalysis   09/27/16  PSA  Total PSA <0.015 ng/dL   Notes:                     Urine will be cultured.   PROCEDURES:          Urinalysis w/Scope Dipstick Dipstick Cont'd Micro  Color: Amber Bilirubin: Neg WBC/hpf: 0 - 5/hpf  Appearance: Cloudy Ketones: Neg RBC/hpf: >60/hpf  Specific Gravity: 1.020 Blood: 3+ Bacteria: Few (10-25/hpf)  pH: 6.0 Protein: 1+ Cystals: NS (Not Seen)  Glucose: Neg Urobilinogen: 0.2 Casts: NS (Not Seen)    Nitrites: Neg Trichomonas: Not Present    Leukocyte Esterase: 1+ Mucous: Not Present      Epithelial Cells: 0 - 5/hpf      Yeast: NS (Not Seen)      Sperm: Not Present    ASSESSMENT:      ICD-10 Details  1 GU:   Gross hematuria - R31.0   2   Renal pelvis cancer, right - C65.1    PLAN:           Orders Labs Urine Culture          Schedule Return Visit/Planned Activity: Other See Visit Notes             Note: Will call to schedule surgery          Document Letter(s):  Created for Patient: Clinical Summary         Notes:   1. High-grade urothelial carcinoma of the right renal collecting system: We reviewed his pathology report today that confirmed high-grade urothelial carcinoma of his solitary right kidney. We reviewed options which would include right nephroureterectomy as optimal cancer treatment albeit something that would also require him to be on dialysis considering his solitary right kidney. He is not very interested in considering a  nephroureterectomy based on this concern at age 75. We therefore reviewed alternatives including continued endoscopic management. I recommended that we consider repeating his ureteroscopy with further laser ablation if indicated. He understands that it is impossible for me to know if we have completely eradicated his tumor following his last procedure is visualization did become somewhat obscured toward the end of the procedure. Considering that his hematuria has resolved, it would be appropriate to now proceed with additional endoscopic evaluation and treatment. We therefore discussed proceeding with cystoscopy, right ureteroscopy, and laser ablation of remaining tumor. We reviewed this procedure in detail including the potential risks and complications and the expected recovery process. He gives informed consent to proceed.   He understands a continued endoscopic management does increase the risk that he could develop metastatic disease as it is difficult to  fully stage this tumor to determine whether there may be an invasive component. We did discuss the option of proceeding with BCG therapy with an indwelling ureteral stent to allow the BCG to reflux into the renal collecting system. We will discuss this further in the future.   2. Prostate cancer: He did ask me about the need for continued androgen deprivation. His PSA has remained undetectable and he has been on androgen deprivation therapy for over 20 years. Although I do not have his initial pathology information, I did discuss with him that it would seem to be reasonable to discontinue androgen deprivation with continued close monitoring of his PSA. We'll give this further thought and is likely to discuss this with Dr. Lerry Liner in more detail.   Cc: Dr. Alain Marion  Majel Homer, PA-C    * Signed by Raynelle Bring, M.D. on 11/07/16 at 9:10 PM (EDT)*

## 2016-11-16 ENCOUNTER — Ambulatory Visit (HOSPITAL_COMMUNITY): Payer: Medicare Other | Admitting: Anesthesiology

## 2016-11-16 ENCOUNTER — Ambulatory Visit (HOSPITAL_COMMUNITY)
Admission: RE | Admit: 2016-11-16 | Discharge: 2016-11-16 | Disposition: A | Discharge status: Home / Self Care | Payer: Medicare Other | Source: Ambulatory Visit | Attending: Urology | Admitting: Urology

## 2016-11-16 ENCOUNTER — Ambulatory Visit (HOSPITAL_COMMUNITY): Payer: Medicare Other

## 2016-11-16 ENCOUNTER — Encounter (HOSPITAL_COMMUNITY): Payer: Self-pay | Admitting: Emergency Medicine

## 2016-11-16 ENCOUNTER — Encounter (HOSPITAL_COMMUNITY)
Admission: RE | Disposition: A | Discharge status: Home / Self Care | Payer: Self-pay | Source: Ambulatory Visit | Attending: Urology

## 2016-11-16 DIAGNOSIS — Z87891 Personal history of nicotine dependence: Secondary | ICD-10-CM | POA: Diagnosis not present

## 2016-11-16 DIAGNOSIS — C651 Malignant neoplasm of right renal pelvis: Secondary | ICD-10-CM | POA: Insufficient documentation

## 2016-11-16 DIAGNOSIS — I10 Essential (primary) hypertension: Secondary | ICD-10-CM | POA: Insufficient documentation

## 2016-11-16 DIAGNOSIS — G4730 Sleep apnea, unspecified: Secondary | ICD-10-CM | POA: Diagnosis not present

## 2016-11-16 DIAGNOSIS — I2510 Atherosclerotic heart disease of native coronary artery without angina pectoris: Secondary | ICD-10-CM | POA: Diagnosis not present

## 2016-11-16 DIAGNOSIS — Z79899 Other long term (current) drug therapy: Secondary | ICD-10-CM | POA: Insufficient documentation

## 2016-11-16 DIAGNOSIS — Z8546 Personal history of malignant neoplasm of prostate: Secondary | ICD-10-CM | POA: Diagnosis not present

## 2016-11-16 DIAGNOSIS — Z951 Presence of aortocoronary bypass graft: Secondary | ICD-10-CM | POA: Insufficient documentation

## 2016-11-16 HISTORY — PX: CYSTOSCOPY WITH FULGERATION: SHX6638

## 2016-11-16 HISTORY — PX: CYSTOSCOPY WITH URETEROSCOPY AND STENT PLACEMENT: SHX6377

## 2016-11-16 LAB — CBC
HEMATOCRIT: 37.8 % — AB (ref 39.0–52.0)
Hemoglobin: 13 g/dL (ref 13.0–17.0)
MCH: 30.2 pg (ref 26.0–34.0)
MCHC: 34.4 g/dL (ref 30.0–36.0)
MCV: 87.7 fL (ref 78.0–100.0)
Platelets: 111 10*3/uL — ABNORMAL LOW (ref 150–400)
RBC: 4.31 MIL/uL (ref 4.22–5.81)
RDW: 12.8 % (ref 11.5–15.5)
WBC: 6.9 10*3/uL (ref 4.0–10.5)

## 2016-11-16 LAB — BASIC METABOLIC PANEL
Anion gap: 9 (ref 5–15)
BUN: 22 mg/dL — AB (ref 6–20)
CO2: 23 mmol/L (ref 22–32)
CREATININE: 1.3 mg/dL — AB (ref 0.61–1.24)
Calcium: 9.3 mg/dL (ref 8.9–10.3)
Chloride: 109 mmol/L (ref 101–111)
GFR, EST AFRICAN AMERICAN: 58 mL/min — AB (ref >60)
GFR, EST NON AFRICAN AMERICAN: 50 mL/min — AB (ref >60)
Glucose, Bld: 90 mg/dL (ref 65–99)
POTASSIUM: 4.3 mmol/L (ref 3.5–5.1)
SODIUM: 141 mmol/L (ref 135–145)

## 2016-11-16 SURGERY — CYSTOSCOPY, WITH BLADDER FULGURATION
Anesthesia: General

## 2016-11-16 MED ORDER — FENTANYL CITRATE (PF) 100 MCG/2ML IJ SOLN
INTRAMUSCULAR | Status: AC
  Filled 2016-11-16: qty 2

## 2016-11-16 MED ORDER — PROPOFOL 10 MG/ML IV BOLUS
INTRAVENOUS | Status: DC | PRN
  Administered 2016-11-16: 160 mg via INTRAVENOUS

## 2016-11-16 MED ORDER — PROPOFOL 10 MG/ML IV BOLUS
INTRAVENOUS | Status: AC
  Filled 2016-11-16: qty 20

## 2016-11-16 MED ORDER — EPHEDRINE SULFATE 50 MG/ML IJ SOLN
INTRAMUSCULAR | Status: DC | PRN
  Administered 2016-11-16: 5 mg via INTRAVENOUS

## 2016-11-16 MED ORDER — DEXAMETHASONE SODIUM PHOSPHATE 10 MG/ML IJ SOLN
INTRAMUSCULAR | Status: DC | PRN
  Administered 2016-11-16: 10 mg via INTRAVENOUS

## 2016-11-16 MED ORDER — ONDANSETRON HCL 4 MG/2ML IJ SOLN
INTRAMUSCULAR | Status: DC | PRN
  Administered 2016-11-16: 4 mg via INTRAVENOUS

## 2016-11-16 MED ORDER — FENTANYL CITRATE (PF) 100 MCG/2ML IJ SOLN: 25.0000 ug | INTRAMUSCULAR | Status: DC | PRN

## 2016-11-16 MED ORDER — LIDOCAINE 2% (20 MG/ML) 5 ML SYRINGE
INTRAMUSCULAR | Status: DC | PRN
  Administered 2016-11-16: 60 mg via INTRAVENOUS

## 2016-11-16 MED ORDER — PHENAZOPYRIDINE HCL 100 MG PO TABS: 100.0000 mg | ORAL_TABLET | Freq: Three times a day (TID) | ORAL | 0 refills | Status: DC | PRN

## 2016-11-16 MED ORDER — ONDANSETRON HCL 4 MG/2ML IJ SOLN: 4.0000 mg | Freq: Once | INTRAMUSCULAR | Status: DC | PRN

## 2016-11-16 MED ORDER — ONDANSETRON HCL 4 MG/2ML IJ SOLN
INTRAMUSCULAR | Status: AC
  Filled 2016-11-16: qty 2

## 2016-11-16 MED ORDER — LACTATED RINGERS IV SOLN
INTRAVENOUS | Status: DC
  Administered 2016-11-16: 11:00:00 via INTRAVENOUS

## 2016-11-16 MED ORDER — CIPROFLOXACIN IN D5W 400 MG/200ML IV SOLN
400.0000 mg | Freq: Once | INTRAVENOUS | Status: AC
  Administered 2016-11-16: 400 mg via INTRAVENOUS
  Filled 2016-11-16: qty 200

## 2016-11-16 MED ORDER — SODIUM CHLORIDE 0.9 % IR SOLN
Status: DC | PRN
  Administered 2016-11-16: 6000 mL

## 2016-11-16 MED ORDER — LIDOCAINE 2% (20 MG/ML) 5 ML SYRINGE
INTRAMUSCULAR | Status: AC
  Filled 2016-11-16: qty 5

## 2016-11-16 MED ORDER — FENTANYL CITRATE (PF) 100 MCG/2ML IJ SOLN
INTRAMUSCULAR | Status: DC | PRN
  Administered 2016-11-16: 25 ug via INTRAVENOUS

## 2016-11-16 MED ORDER — DEXAMETHASONE SODIUM PHOSPHATE 10 MG/ML IJ SOLN
INTRAMUSCULAR | Status: AC
  Filled 2016-11-16: qty 1

## 2016-11-16 MED ORDER — EPHEDRINE 5 MG/ML INJ
INTRAVENOUS | Status: AC
  Filled 2016-11-16: qty 10

## 2016-11-16 SURGICAL SUPPLY — 26 items
BAG URINE DRAINAGE (UROLOGICAL SUPPLIES) IMPLANT
BAG URO CATCHER STRL LF (MISCELLANEOUS) ×3 IMPLANT
BASKET ZERO TIP NITINOL 2.4FR (BASKET) IMPLANT
BSKT STON RTRVL ZERO TP 2.4FR (BASKET)
CATH INTERMIT  6FR 70CM (CATHETERS) ×2 IMPLANT
CLOTH BEACON ORANGE TIMEOUT ST (SAFETY) ×3 IMPLANT
COVER FOOTSWITCH UNIV (MISCELLANEOUS) ×2 IMPLANT
COVER SURGICAL LIGHT HANDLE (MISCELLANEOUS) ×1 IMPLANT
ELECT REM PT RETURN 15FT ADLT (MISCELLANEOUS) ×3 IMPLANT
FIBER LASER FLEXIVA 365 (UROLOGICAL SUPPLIES) IMPLANT
FIBER LASER TRAC TIP (UROLOGICAL SUPPLIES) ×2 IMPLANT
GLOVE BIOGEL M STRL SZ7.5 (GLOVE) ×3 IMPLANT
GOWN STRL REUS W/TWL LRG LVL3 (GOWN DISPOSABLE) ×6 IMPLANT
GUIDEWIRE ANG ZIPWIRE 038X150 (WIRE) IMPLANT
GUIDEWIRE STR DUAL SENSOR (WIRE) ×3 IMPLANT
IV NS 1000ML (IV SOLUTION)
IV NS 1000ML BAXH (IV SOLUTION) ×1 IMPLANT
LOOP CUT BIPOLAR 24F LRG (ELECTROSURGICAL) IMPLANT
MANIFOLD NEPTUNE II (INSTRUMENTS) ×3 IMPLANT
PACK CYSTO (CUSTOM PROCEDURE TRAY) ×3 IMPLANT
SET ASPIRATION TUBING (TUBING) IMPLANT
SHEATH ACCESS URETERAL 38CM (SHEATH) ×2 IMPLANT
STENT CONTOUR 6FRX26X.038 (STENTS) ×2 IMPLANT
SYRINGE IRR TOOMEY STRL 70CC (SYRINGE) IMPLANT
TUBING CONNECTING 10 (TUBING) ×2 IMPLANT
TUBING CONNECTING 10' (TUBING) ×1

## 2016-11-16 NOTE — Anesthesia Preprocedure Evaluation (Addendum)
Anesthesia Evaluation  Patient identified by MRN, date of birth, ID band Patient awake    Reviewed: Allergy & Precautions, NPO status , Patient's Chart, lab work & pertinent test results  Airway Mallampati: II  TM Distance: >3 FB Neck ROM: Full    Dental  (+) Dental Advisory Given, Chipped   Pulmonary sleep apnea , former smoker,    Pulmonary exam normal breath sounds clear to auscultation       Cardiovascular hypertension, + CAD and + CABG  Normal cardiovascular exam Rhythm:Regular Rate:Normal     Neuro/Psych Anxiety negative neurological ROS     GI/Hepatic negative GI ROS, Neg liver ROS,   Endo/Other  negative endocrine ROS  Renal/GU S/p left nephrectomy for renal cancer, right kidney urothelial cancer   Prostate cancer negative genitourinary   Musculoskeletal negative musculoskeletal ROS (+)   Abdominal   Peds negative pediatric ROS (+)  Hematology negative hematology ROS (+)   Anesthesia Other Findings   Reproductive/Obstetrics negative OB ROS                            Anesthesia Physical  Anesthesia Plan  ASA: III  Anesthesia Plan: General   Post-op Pain Management:    Induction: Intravenous  PONV Risk Score and Plan: 3 and Ondansetron, Dexamethasone, Propofol infusion and Treatment may vary due to age or medical condition  Airway Management Planned: LMA  Additional Equipment: None  Intra-op Plan:   Post-operative Plan: Extubation in OR  Informed Consent: I have reviewed the patients History and Physical, chart, labs and discussed the procedure including the risks, benefits and alternatives for the proposed anesthesia with the patient or authorized representative who has indicated his/her understanding and acceptance.   Dental advisory given  Plan Discussed with: CRNA  Anesthesia Plan Comments:        Anesthesia Quick Evaluation

## 2016-11-16 NOTE — Anesthesia Postprocedure Evaluation (Signed)
Anesthesia Post Note  Patient: Braedan Meuth.  Procedure(s) Performed: Procedure(s) (LRB): CYSTOSCOPY WITH FULGERATION WITH HOLMIUM LASER (N/A) CYSTOSCOPY WITH URETEROSCOPY AND STENT PLACEMENT (N/A)     Patient location during evaluation: PACU Anesthesia Type: General Level of consciousness: awake and alert Pain management: pain level controlled Vital Signs Assessment: post-procedure vital signs reviewed and stable Respiratory status: spontaneous breathing, nonlabored ventilation and respiratory function stable Cardiovascular status: blood pressure returned to baseline and stable Postop Assessment: no apparent nausea or vomiting Anesthetic complications: no    Last Vitals:  Vitals:   11/16/16 1515 11/16/16 1530  BP: (!) 157/72 (!) 131/49  Pulse: (!) 53 (!) 54  Resp: (!) 21 16  Temp: 36.5 C 36.5 C  SpO2: 94% 98%    Last Pain:  Vitals:   11/16/16 1515  TempSrc:   PainSc: 0-No pain                 Audry Pili

## 2016-11-16 NOTE — Anesthesia Procedure Notes (Signed)
Procedure Name: LMA Insertion Date/Time: 11/16/2016 1:15 PM Performed by: Dione Booze Pre-anesthesia Checklist: Emergency Drugs available, Suction available, Patient being monitored and Patient identified Patient Re-evaluated:Patient Re-evaluated prior to induction Oxygen Delivery Method: Circle system utilized Preoxygenation: Pre-oxygenation with 100% oxygen Induction Type: IV induction LMA: LMA with gastric port inserted LMA Size: 4.0 Number of attempts: 1 Placement Confirmation: positive ETCO2 and breath sounds checked- equal and bilateral Tube secured with: Tape Dental Injury: Teeth and Oropharynx as per pre-operative assessment

## 2016-11-16 NOTE — Discharge Instructions (Signed)

## 2016-11-16 NOTE — Interval H&P Note (Signed)
History and Physical Interval Note:  11/16/2016 12:34 PM  Wesley Snyder.  has presented today for surgery, with the diagnosis of RIGHT RENAL UROTHELIAL CARCINOMA  The various methods of treatment have been discussed with the patient and family. After consideration of risks, benefits and other options for treatment, the patient has consented to  Procedure(s): [place] (N/A) CYSTOSCOPY WITH URETEROSCOPY AND STENT PLACEMENT (N/A) as a surgical intervention .  The patient's history has been reviewed, patient examined, no change in status, stable for surgery.  I have reviewed the patient's chart and labs.  Questions were answered to the patient's satisfaction.     Solomiya Pascale,LES

## 2016-11-16 NOTE — Transfer of Care (Signed)
Immediate Anesthesia Transfer of Care Note  Patient: Wesley Snyder.  Procedure(s) Performed: Procedure(s): CYSTOSCOPY WITH FULGERATION WITH HOLMIUM LASER (N/A) CYSTOSCOPY WITH URETEROSCOPY AND STENT PLACEMENT (N/A)  Patient Location: PACU  Anesthesia Type:General  Level of Consciousness: awake and patient cooperative  Airway & Oxygen Therapy: Patient Spontanous Breathing and Patient connected to face mask oxygen  Post-op Assessment: Report given to RN and Post -op Vital signs reviewed and stable  Post vital signs: Reviewed and stable  Last Vitals:  Vitals:   11/16/16 1025  BP: (!) 143/78  Pulse: 65  Resp: 16  Temp: 36.6 C  SpO2: 98%    Last Pain:  Vitals:   11/16/16 1025  TempSrc: Oral      Patients Stated Pain Goal: 3 (16/10/96 0454)  Complications: No apparent anesthesia complications

## 2016-11-16 NOTE — Op Note (Signed)
Preoperative diagnosis: High-grade urothelial carcinoma of the right renal collecting system  Postoperative diagnosis: Same as above  Procedures: 1.  Cystoscopy 2.  Right ureteral stent removal 3.  Right ureteroscopy with laser ablation of urothelial tumor 4.  Right ureteral stent placement (6 x 26)  Surgeon: Pryor Curia. M.D.  Anesthesia: General  Complications: None  EBL: Minimal  Intraoperative findings: There did appear to be a substantial residual tumor within the upper pole calyx of the right renal collecting system.  Much of this did appear to be necrotic consistent with his prior ablation.  However additional viable tumor was definitely present as well.  Tumor measured approximately 2 cm.  Indication: Wesley Snyder is an 83 year old gentleman with a known high-grade urothelial carcinoma in the renal collecting system of a solitary right kidney.  After reviewing options including nephroureterectomy, he wished to avoid dialysis at age 77 and instead wished to proceed with endoscopic management with the goal of providing palliation and local tumor control.  He was therefore consented for the above procedures and agreed to proceed.  Description of procedure:  The patient was taken to the operating room and a general anesthetic was administered.  He was given preoperative antibiotics, placed in the dorsal lithotomy position, and prepped and draped in the usual sterile fashion.  Next, a preoperative timeout was performed.  Next, cystourethroscopy was performed.  The prostatic urethra was surgically absent.  Inspection of the bladder revealed the indwelling right ureteral stent which was brought out to the urethral meatus with flexible graspers.  A 0.038 sensor guidewire was advanced through the stent up into the right renal pelvis under fluoroscopic guidance and the stent was removed.  A 38 cm 12/14 ureteral access sheath was then advanced over the wire into the proximal ureter under  fluoroscopic guidance.  The digital flexible ureteroscope was then advanced through the access sheath into the right renal collecting system in the renal collecting system was examined.  Again, a tumor was noted in the upper pole calyx.  Much of this tumor was necrotic consistent with his prior ablation procedure a few weeks ago although there was remaining viable tumor.  The tumor overall measured 2 cm.  A 200  fiber was then placed through the ureteroscope and on a setting of 1.2 J and 5 Hz, laser ablation commenced.  All visible tumor was ablated during this procedure.  Visualization was relatively good throughout the procedure.  Once the tumor was felt to be completely ablated, I removed the ureteral access sheath and back loaded the guidewire over the cystoscope.  A 6 x 26 double-J ureteral stent was then advanced over the wire using Seldinger technique and positioned appropriately under fluoroscopic and cystoscopic guidance.  The patient tolerated the procedure well without complications.  His bladder was emptied.  He was able to be awakened and transferred the recovery unit in satisfactory condition.

## 2016-12-13 ENCOUNTER — Other Ambulatory Visit: Payer: Self-pay | Admitting: Urology

## 2017-01-08 ENCOUNTER — Encounter (INDEPENDENT_AMBULATORY_CARE_PROVIDER_SITE_OTHER): Payer: Self-pay

## 2017-01-08 ENCOUNTER — Other Ambulatory Visit: Payer: Self-pay

## 2017-01-08 ENCOUNTER — Encounter (HOSPITAL_COMMUNITY): Payer: Self-pay

## 2017-01-08 ENCOUNTER — Encounter (HOSPITAL_COMMUNITY)
Admission: RE | Admit: 2017-01-08 | Discharge: 2017-01-08 | Disposition: A | Discharge status: Home / Self Care | Payer: Medicare Other | Source: Ambulatory Visit | Attending: Urology | Admitting: Urology

## 2017-01-08 DIAGNOSIS — C641 Malignant neoplasm of right kidney, except renal pelvis: Secondary | ICD-10-CM | POA: Diagnosis not present

## 2017-01-08 DIAGNOSIS — Z01818 Encounter for other preprocedural examination: Secondary | ICD-10-CM | POA: Diagnosis present

## 2017-01-08 DIAGNOSIS — Q600 Renal agenesis, unilateral: Secondary | ICD-10-CM | POA: Diagnosis not present

## 2017-01-08 HISTORY — DX: Unspecified osteoarthritis, unspecified site: M19.90

## 2017-01-08 LAB — BASIC METABOLIC PANEL
Anion gap: 5 (ref 5–15)
BUN: 26 mg/dL — AB (ref 6–20)
CHLORIDE: 112 mmol/L — AB (ref 101–111)
CO2: 25 mmol/L (ref 22–32)
CREATININE: 1.43 mg/dL — AB (ref 0.61–1.24)
Calcium: 9.7 mg/dL (ref 8.9–10.3)
GFR calc Af Amer: 51 mL/min — ABNORMAL LOW (ref >60)
GFR calc non Af Amer: 44 mL/min — ABNORMAL LOW (ref >60)
Glucose, Bld: 91 mg/dL (ref 65–99)
Potassium: 5 mmol/L (ref 3.5–5.1)
Sodium: 142 mmol/L (ref 135–145)

## 2017-01-08 LAB — CBC
HCT: 39.4 % (ref 39.0–52.0)
Hemoglobin: 13.5 g/dL (ref 13.0–17.0)
MCH: 30.5 pg (ref 26.0–34.0)
MCHC: 34.3 g/dL (ref 30.0–36.0)
MCV: 89.1 fL (ref 78.0–100.0)
PLATELETS: 135 10*3/uL — AB (ref 150–400)
RBC: 4.42 MIL/uL (ref 4.22–5.81)
RDW: 12.9 % (ref 11.5–15.5)
WBC: 8.8 10*3/uL (ref 4.0–10.5)

## 2017-01-08 NOTE — Progress Notes (Signed)
BMP done 01/08/17 faxed via epic to Dr Alinda Money.

## 2017-01-08 NOTE — Progress Notes (Addendum)
10/12/16-LOV- Dr Sharyon Cable- cardiology on chart  ECHO-10/12/16-on chart Stress Test- 10/12/16- on chart  Clearance- 10/18/16- on chart from Dr Sharyon Cable.  Heart cath-10/17/16- on chart  EKG-10/12/16- on chart

## 2017-01-08 NOTE — Patient Instructions (Signed)
Warren Danes.  01/08/2017   Your procedure is scheduled on: 12/11/16   [facility]  Entrance Take [place]  elevators to 3rd floor to  [place] at     12 noon   Follow sign  Call this number if you have problems the morning of surgery [phone]    Remember: ONLY 1 PERSON MAY GO WITH YOU TO SHORT STAY TO GET  READY MORNING OF [place].  Do not eat food after midnite.  May have clear lqiuids from 12 midnite until 0800am then nothing by mouth.      Take these medicines the morning of surgery with A SIP OF WATER: none                                 You may not have any metal on your body including hair pins and              piercings  Do not wear jewelry, , lotions, powders or perfumes, deodorant                           Men may shave face and neck.   Do not bring valuables to the hospital. [place].  Contacts, dentures or bridgework may not be worn into surgery.      Patients discharged the day of surgery will not be allowed to drive home.  Name and phone number of your driver:                Please read over the following fact sheets you were given: _____________________________________________________________________             [facility] - Preparing for Surgery Before surgery, you can play an important role.  Because skin is not sterile, your skin needs to be as free of germs as possible.  You can reduce the number of germs on your skin by washing with CHG (chlorahexidine gluconate) soap before surgery.  CHG is an antiseptic cleaner which kills germs and bonds with the skin to continue killing germs even after washing. Please DO NOT use if you have an allergy to CHG or antibacterial soaps.  If your skin becomes reddened/irritated stop using the CHG and inform your nurse when you arrive at Short Stay. Do not shave (including legs and underarms) for at least 48 hours  prior to the first CHG shower.  You may shave your face/neck. Please follow these instructions carefully:  1.  Shower with CHG Soap the night before surgery and the  morning of Surgery.  2.  If you choose to wash your hair, wash your hair first as usual with your  normal  shampoo.  3.  After you shampoo, rinse your hair and body thoroughly to remove the  shampoo.                           4.  Use CHG as you would any other liquid soap.  You can apply chg directly  to the skin and wash  Gently with a scrungie or clean washcloth.  5.  Apply the CHG Soap to your body ONLY FROM THE NECK DOWN.   Do not use on face/ open                           Wound or open sores. Avoid contact with eyes, ears mouth and genitals (private parts).                       Wash face,  Genitals (private parts) with your normal soap.             6.  Wash thoroughly, paying special attention to the area where your surgery  will be performed.  7.  Thoroughly rinse your body with warm water from the neck down.  8.  DO NOT shower/wash with your normal soap after using and rinsing off  the CHG Soap.                9.  Pat yourself dry with a clean towel.            10.  Wear clean pajamas.            11.  Place clean sheets on your bed the night of your first shower and do not  sleep with pets. Day of Surgery : Do not apply any lotions/deodorants the morning of surgery.  Please wear clean clothes to the hospital/surgery center.  FAILURE TO FOLLOW THESE INSTRUCTIONS MAY RESULT IN THE CANCELLATION OF YOUR SURGERY PATIENT SIGNATURE_________________________________  NURSE SIGNATURE__________________________________  ________________________________________________________________________    CLEAR LIQUID DIET   Foods Allowed                                                                     Foods Excluded  Coffee and tea, regular and decaf                             liquids that you cannot  Plain  Jell-O in any flavor                                             see through such as: Fruit ices (not with fruit pulp)                                     milk, soups, orange juice  Iced Popsicles                                    All solid food Carbonated beverages, regular and diet                                    Cranberry, grape and apple juices Sports drinks like Gatorade Lightly seasoned clear broth or consume(fat free) Sugar, honey syrup  Sample Menu Breakfast                                Lunch                                     Supper Cranberry juice                    Beef broth                            Chicken broth Jell-O                                     Grape juice                           Apple juice Coffee or tea                        Jell-O                                      Popsicle                                                Coffee or tea                        Coffee or tea  _____________________________________________________________________

## 2017-01-09 ENCOUNTER — Encounter (HOSPITAL_COMMUNITY): Admission: RE | Admit: 2017-01-09 | Payer: Medicare Other | Source: Ambulatory Visit

## 2017-01-10 NOTE — H&P (Signed)
CC/HPI: High-grade urothelial carcinoma of the right renal pelvis in solitary right kidney   Wesley Snyder returns today after his most recent ureteroscopic laser ablation for his right renal pelvic tumor. Pathology confirmed high-grade urothelial carcinoma with a suspicious focus of lamina propria invasion. It appeared that his tumor was visibly ablated completely although this cannot be absolutely confirmed based on visualization that day. That was his second procedure. He continues to tolerate his stent relatively well although does have some incontinence that appears to be urge incontinence. He has not had much blood in the urine. He does have expected urinary frequency and nocturia with his indwelling ureteral stent. He has denied any fever.     ALLERGIES: None   MEDICATIONS: None   GU PSH: Cysto Uretero Biopsy Fulgura, Right - 11/16/2016 Cysto Uretero W/excise Tumor, Right - 10/23/2016 Cystoscopy Insert Stent, Right - 11/16/2016, Right - 10/23/2016    NON-GU PSH: Coronary Artery Bypass Grafting    GU PMH: Renal pelvis cancer, right - 11/07/2016 Gross hematuria - 09/27/2016 History of kidney cancer - 09/27/2016 Prostate Cancer - 09/27/2016 Right renal neoplasm - 09/27/2016      PMH Notes:   1) Renal cell carcinoma: He is s/p an open left radical nephrectomy at [facility] in 1994.   2) Prostate cancer: He is s/p an open RRP at [facility] in 1994. He apparently had a biochemical recurrence soon after primary therapy and has been on systemic therapy with ADT since then under his urologist in [place].   3) Urothelial carcinoma of the right kidney: He was found to have a right renal collecting system tumor on imaging in 2018 in his solitary right kidney. This was confirmed on ureteroscopic evaluation to be high grade urothelial carcinoma.   Aug 2018: Right ureteroscopy with laser ablation of right renal pelvic tumor - pathology indicated high-grade urothelial carcinoma  Sep 2018: Repeat  right ureteroscopy with laser ablation of remaining right renal pelvic tumor - pathology confirmed high-grade urothelial carcinoma with a suspicious focus of lamina propria invasion      NON-GU PMH: Coronary Artery Disease Sleep Apnea    FAMILY HISTORY: 1 Daughter - Runs in Family 2 sons - Runs in Family Cirrhosis - Father   SOCIAL HISTORY: Marital Status: Married Preferred Language: English; Ethnicity: Not Hispanic Or Latino; Race: White Current Smoking Status: Patient has never smoked.   Tobacco Use Assessment Completed: Used Tobacco in last 30 days? Has never drank.  Drinks 1 caffeinated drink per day.    REVIEW OF SYSTEMS:    GU Review Male:   Patient reports frequent urination, hard to postpone urination, burning/ pain with urination, get up at night to urinate, and leakage of urine. Patient denies stream starts and stops, trouble starting your streams, and have to strain to urinate .  Gastrointestinal (Lower):   Patient reports diarrhea. Patient denies constipation.  Gastrointestinal (Upper):   Patient denies nausea and vomiting.  Constitutional:   Patient denies fever, night sweats, weight loss, and fatigue.  Skin:   Patient denies skin rash/ lesion and itching.  Eyes:   Patient denies blurred vision and double vision.  Ears/ Nose/ Throat:   Patient denies sore throat and sinus problems.  Hematologic/Lymphatic:   Patient reports easy bruising. Patient denies swollen glands.  Cardiovascular:   Patient denies leg swelling and chest pains.  Respiratory:   Patient denies shortness of breath and cough.  Endocrine:   Patient denies excessive thirst.  Musculoskeletal:   Patient denies back  pain and joint pain.  Neurological:   Patient denies headaches and dizziness.  Psychologic:   Patient denies depression and anxiety.     MULTI-SYSTEM PHYSICAL EXAMINATION:    Constitutional: Well-nourished. No physical deformities. Normally developed. Good grooming.  Neck: Neck symmetrical,  not swollen. Normal tracheal position.  Respiratory: No labored breathing, no use of accessory muscles. Clear bilaterally.  Cardiovascular: Normal temperature, normal extremity pulses, no swelling, no varicosities. Regular rate and rhythm.      ASSESSMENT:      ICD-10 Details  2 GU:   Renal pelvis cancer, right - C65.1     PLAN:             Notes:   1. High-grade urothelial carcinoma of the right renal pelvis:  I have recommended that he proceed with repeat ureteroscopic evaluation. This will allow for close surveillance and further evaluation to ensure all visible tumor has been ablated. We will be prepared for repeat ablation if necessary. If he is disease-free at this time, we will likely will leave an indwelling stent in place and we'll discuss the option of intravesical BCG therapy with an indwelling stent as an option for management. If he ultimately opts to avoid BCG therapy, we will then discuss stent removal at that time if he is known to be disease-free.   He will undergo cystoscopy, right ureteroscopy with possible laser ablation of tumor, and ureteral stent replacement.

## 2017-01-11 ENCOUNTER — Ambulatory Visit (HOSPITAL_COMMUNITY): Payer: Medicare Other | Admitting: Certified Registered Nurse Anesthetist

## 2017-01-11 ENCOUNTER — Encounter (HOSPITAL_COMMUNITY): Payer: Self-pay | Admitting: *Deleted

## 2017-01-11 ENCOUNTER — Encounter (HOSPITAL_COMMUNITY)
Admission: RE | Disposition: A | Discharge status: Home / Self Care | Payer: Self-pay | Source: Ambulatory Visit | Attending: Urology

## 2017-01-11 ENCOUNTER — Other Ambulatory Visit: Payer: Self-pay

## 2017-01-11 ENCOUNTER — Observation Stay (HOSPITAL_COMMUNITY)
Admission: RE | Admit: 2017-01-11 | Discharge: 2017-01-12 | Disposition: A | Discharge status: Home / Self Care | Payer: Medicare Other | Source: Ambulatory Visit | Attending: Urology | Admitting: Urology

## 2017-01-11 ENCOUNTER — Ambulatory Visit (HOSPITAL_COMMUNITY): Payer: Medicare Other

## 2017-01-11 DIAGNOSIS — Z905 Acquired absence of kidney: Secondary | ICD-10-CM | POA: Insufficient documentation

## 2017-01-11 DIAGNOSIS — Z85528 Personal history of other malignant neoplasm of kidney: Secondary | ICD-10-CM | POA: Insufficient documentation

## 2017-01-11 DIAGNOSIS — C651 Malignant neoplasm of right renal pelvis: Principal | ICD-10-CM | POA: Diagnosis present

## 2017-01-11 DIAGNOSIS — I2510 Atherosclerotic heart disease of native coronary artery without angina pectoris: Secondary | ICD-10-CM | POA: Diagnosis not present

## 2017-01-11 DIAGNOSIS — Z8546 Personal history of malignant neoplasm of prostate: Secondary | ICD-10-CM | POA: Diagnosis not present

## 2017-01-11 DIAGNOSIS — G4730 Sleep apnea, unspecified: Secondary | ICD-10-CM | POA: Diagnosis not present

## 2017-01-11 DIAGNOSIS — Z79899 Other long term (current) drug therapy: Secondary | ICD-10-CM | POA: Insufficient documentation

## 2017-01-11 DIAGNOSIS — Z951 Presence of aortocoronary bypass graft: Secondary | ICD-10-CM | POA: Insufficient documentation

## 2017-01-11 DIAGNOSIS — M1990 Unspecified osteoarthritis, unspecified site: Secondary | ICD-10-CM | POA: Diagnosis not present

## 2017-01-11 DIAGNOSIS — I10 Essential (primary) hypertension: Secondary | ICD-10-CM | POA: Diagnosis not present

## 2017-01-11 HISTORY — PX: CYSTOSCOPY/URETEROSCOPY/HOLMIUM LASER: SHX6545

## 2017-01-11 LAB — BASIC METABOLIC PANEL
Anion gap: 8 (ref 5–15)
BUN: 18 mg/dL (ref 6–20)
CALCIUM: 8.9 mg/dL (ref 8.9–10.3)
CO2: 25 mmol/L (ref 22–32)
CREATININE: 1.33 mg/dL — AB (ref 0.61–1.24)
Chloride: 109 mmol/L (ref 101–111)
GFR calc Af Amer: 56 mL/min — ABNORMAL LOW (ref >60)
GFR calc non Af Amer: 48 mL/min — ABNORMAL LOW (ref >60)
GLUCOSE: 101 mg/dL — AB (ref 65–99)
Potassium: 3.8 mmol/L (ref 3.5–5.1)
Sodium: 142 mmol/L (ref 135–145)

## 2017-01-11 LAB — HEMOGLOBIN AND HEMATOCRIT, BLOOD
HCT: 34.9 % — ABNORMAL LOW (ref 39.0–52.0)
Hemoglobin: 11.9 g/dL — ABNORMAL LOW (ref 13.0–17.0)

## 2017-01-11 SURGERY — CYSTOURETEROSCOPY, USING HOLMIUM LASER
Anesthesia: General | Laterality: Right

## 2017-01-11 MED ORDER — STERILE WATER FOR IRRIGATION IR SOLN
Status: DC | PRN
  Administered 2017-01-11: 1000 mL

## 2017-01-11 MED ORDER — BELLADONNA-OPIUM 16.2-30 MG RE SUPP: 30.0000 mg | Freq: Four times a day (QID) | RECTAL | Status: DC | PRN

## 2017-01-11 MED ORDER — SODIUM CHLORIDE 0.9 % IV SOLN
INTRAVENOUS | Status: DC
  Administered 2017-01-12: via INTRAVENOUS

## 2017-01-11 MED ORDER — PROPOFOL 10 MG/ML IV BOLUS
INTRAVENOUS | Status: DC | PRN
  Administered 2017-01-11: 140 mg via INTRAVENOUS
  Administered 2017-01-11: 40 mg via INTRAVENOUS

## 2017-01-11 MED ORDER — ONDANSETRON HCL 4 MG/2ML IJ SOLN
INTRAMUSCULAR | Status: AC
  Filled 2017-01-11: qty 2

## 2017-01-11 MED ORDER — ONDANSETRON HCL 4 MG/2ML IJ SOLN
INTRAMUSCULAR | Status: DC | PRN
Start: 2017-01-11 — End: 2017-01-11
  Administered 2017-01-11: 4 mg via INTRAVENOUS

## 2017-01-11 MED ORDER — HYDROCODONE-ACETAMINOPHEN 5-325 MG PO TABS
1.0000 | ORAL_TABLET | ORAL | Status: DC | PRN
  Administered 2017-01-11: 2 via ORAL
  Filled 2017-01-11: qty 2

## 2017-01-11 MED ORDER — LABETALOL HCL 5 MG/ML IV SOLN
INTRAVENOUS | Status: DC | PRN
  Administered 2017-01-11: 5 mg via INTRAVENOUS

## 2017-01-11 MED ORDER — PROPOFOL 10 MG/ML IV BOLUS
INTRAVENOUS | Status: AC
  Filled 2017-01-11: qty 20

## 2017-01-11 MED ORDER — FENTANYL CITRATE (PF) 100 MCG/2ML IJ SOLN
INTRAMUSCULAR | Status: AC
  Filled 2017-01-11: qty 2

## 2017-01-11 MED ORDER — FENTANYL CITRATE (PF) 100 MCG/2ML IJ SOLN
25.0000 ug | INTRAMUSCULAR | Status: DC | PRN
  Administered 2017-01-11: 25 ug via INTRAVENOUS
  Administered 2017-01-11: 50 ug via INTRAVENOUS

## 2017-01-11 MED ORDER — DEXTROSE 5 % IV SOLN
2.0000 g | Freq: Once | INTRAVENOUS | Status: AC
  Administered 2017-01-11: 2 g via INTRAVENOUS
  Filled 2017-01-11: qty 2

## 2017-01-11 MED ORDER — DIPHENHYDRAMINE HCL 50 MG/ML IJ SOLN: 12.5000 mg | Freq: Four times a day (QID) | INTRAMUSCULAR | Status: DC | PRN

## 2017-01-11 MED ORDER — BELLADONNA ALKALOIDS-OPIUM 16.2-60 MG RE SUPP: 1.0000 | Freq: Four times a day (QID) | RECTAL | Status: DC | PRN

## 2017-01-11 MED ORDER — LIDOCAINE 2% (20 MG/ML) 5 ML SYRINGE
INTRAMUSCULAR | Status: AC
  Filled 2017-01-11: qty 5

## 2017-01-11 MED ORDER — DEXAMETHASONE SODIUM PHOSPHATE 10 MG/ML IJ SOLN
INTRAMUSCULAR | Status: DC | PRN
  Administered 2017-01-11: 10 mg via INTRAVENOUS

## 2017-01-11 MED ORDER — CEFTRIAXONE SODIUM 1 G IJ SOLR
1.0000 g | INTRAMUSCULAR | Status: DC
  Filled 2017-01-11: qty 10

## 2017-01-11 MED ORDER — EPHEDRINE 5 MG/ML INJ
INTRAVENOUS | Status: AC
  Filled 2017-01-11: qty 10

## 2017-01-11 MED ORDER — PHENAZOPYRIDINE HCL 100 MG PO TABS: 100.0000 mg | ORAL_TABLET | Freq: Three times a day (TID) | ORAL | 0 refills | Status: DC | PRN

## 2017-01-11 MED ORDER — ONDANSETRON HCL 4 MG/2ML IJ SOLN: 4.0000 mg | Freq: Once | INTRAMUSCULAR | Status: DC | PRN

## 2017-01-11 MED ORDER — ONDANSETRON HCL 4 MG/2ML IJ SOLN: 4.0000 mg | INTRAMUSCULAR | Status: DC | PRN

## 2017-01-11 MED ORDER — PHENYLEPHRINE 40 MCG/ML (10ML) SYRINGE FOR IV PUSH (FOR BLOOD PRESSURE SUPPORT)
PREFILLED_SYRINGE | INTRAVENOUS | Status: AC
  Filled 2017-01-11: qty 10

## 2017-01-11 MED ORDER — LACTATED RINGERS IV SOLN
INTRAVENOUS | Status: DC
  Administered 2017-01-11: 12:00:00 via INTRAVENOUS

## 2017-01-11 MED ORDER — EPHEDRINE SULFATE-NACL 50-0.9 MG/10ML-% IV SOSY
PREFILLED_SYRINGE | INTRAVENOUS | Status: DC | PRN
  Administered 2017-01-11: 5 mg via INTRAVENOUS

## 2017-01-11 MED ORDER — LACTATED RINGERS IV SOLN: INTRAVENOUS | Status: DC

## 2017-01-11 MED ORDER — HYDROCODONE-ACETAMINOPHEN 5-325 MG PO TABS: 1.0000 | ORAL_TABLET | Freq: Four times a day (QID) | ORAL | 0 refills | Status: DC | PRN

## 2017-01-11 MED ORDER — FENTANYL CITRATE (PF) 100 MCG/2ML IJ SOLN
INTRAMUSCULAR | Status: AC
  Administered 2017-01-11: 25 ug via INTRAVENOUS
  Filled 2017-01-11: qty 2

## 2017-01-11 MED ORDER — FENTANYL CITRATE (PF) 100 MCG/2ML IJ SOLN
INTRAMUSCULAR | Status: DC | PRN
  Administered 2017-01-11: 50 ug via INTRAVENOUS
  Administered 2017-01-11 (×2): 25 ug via INTRAVENOUS

## 2017-01-11 MED ORDER — PHENYLEPHRINE 40 MCG/ML (10ML) SYRINGE FOR IV PUSH (FOR BLOOD PRESSURE SUPPORT)
PREFILLED_SYRINGE | INTRAVENOUS | Status: DC | PRN
  Administered 2017-01-11: 40 ug via INTRAVENOUS

## 2017-01-11 MED ORDER — ZOLPIDEM TARTRATE 5 MG PO TABS: 5.0000 mg | ORAL_TABLET | Freq: Every evening | ORAL | Status: DC | PRN

## 2017-01-11 MED ORDER — ACETAMINOPHEN 325 MG PO TABS: 650.0000 mg | ORAL_TABLET | ORAL | Status: DC | PRN

## 2017-01-11 MED ORDER — LIDOCAINE 2% (20 MG/ML) 5 ML SYRINGE
INTRAMUSCULAR | Status: DC | PRN
  Administered 2017-01-11: 80 mg via INTRAVENOUS

## 2017-01-11 MED ORDER — DIPHENHYDRAMINE HCL 12.5 MG/5ML PO ELIX: 12.5000 mg | ORAL_SOLUTION | Freq: Four times a day (QID) | ORAL | Status: DC | PRN

## 2017-01-11 MED ORDER — DEXAMETHASONE SODIUM PHOSPHATE 10 MG/ML IJ SOLN
INTRAMUSCULAR | Status: AC
  Filled 2017-01-11: qty 1

## 2017-01-11 MED ORDER — SODIUM CHLORIDE 0.9 % IR SOLN
Status: DC | PRN
  Administered 2017-01-11: 4000 mL via INTRAVESICAL

## 2017-01-11 SURGICAL SUPPLY — 29 items
BAG URINE DRAINAGE (UROLOGICAL SUPPLIES) ×2 IMPLANT
BAG URO CATCHER STRL LF (MISCELLANEOUS) ×3 IMPLANT
BASKET ZERO TIP NITINOL 2.4FR (BASKET) IMPLANT
BSKT STON RTRVL ZERO TP 2.4FR (BASKET)
CATH HEMA 3WAY 30CC 22FR COUDE (CATHETERS) ×2 IMPLANT
CATH INTERMIT  6FR 70CM (CATHETERS) IMPLANT
CLOTH BEACON ORANGE TIMEOUT ST (SAFETY) IMPLANT
COVER FOOTSWITCH UNIV (MISCELLANEOUS) IMPLANT
COVER SURGICAL LIGHT HANDLE (MISCELLANEOUS) ×1 IMPLANT
ELECT LLETZ BALL 3MM DISP (ELECTRODE) ×2 IMPLANT
FIBER LASER FLEXIVA 365 (UROLOGICAL SUPPLIES) IMPLANT
FIBER LASER TRAC TIP (UROLOGICAL SUPPLIES) ×2 IMPLANT
GLOVE BIOGEL M STRL SZ7.5 (GLOVE) ×3 IMPLANT
GOWN STRL REUS W/TWL LRG LVL3 (GOWN DISPOSABLE) ×4 IMPLANT
GUIDEWIRE ANG ZIPWIRE 038X150 (WIRE) IMPLANT
GUIDEWIRE STR DUAL SENSOR (WIRE) ×3 IMPLANT
HOLDER FOLEY CATH W/STRAP (MISCELLANEOUS) ×2 IMPLANT
IV NS 1000ML (IV SOLUTION) ×3
IV NS 1000ML BAXH (IV SOLUTION) ×1 IMPLANT
MANIFOLD NEPTUNE II (INSTRUMENTS) ×3 IMPLANT
NS IRRIG 1000ML POUR BTL (IV SOLUTION) ×2 IMPLANT
PACK CYSTO (CUSTOM PROCEDURE TRAY) ×3 IMPLANT
SHEATH URETERAL 12FRX35CM (MISCELLANEOUS) ×2 IMPLANT
STENT URET 6FRX24 CONTOUR (STENTS) ×2 IMPLANT
SYR 30ML LL (SYRINGE) ×3 IMPLANT
SYRINGE IRR TOOMEY STRL 70CC (SYRINGE) ×2 IMPLANT
TUBING CONNECTING 10 (TUBING) ×2 IMPLANT
TUBING CONNECTING 10' (TUBING) ×1
WATER STERILE IRR 500ML POUR (IV SOLUTION) ×3 IMPLANT

## 2017-01-11 NOTE — Op Note (Signed)
Preoperative diagnosis: Urothelial carcinoma of the right renal pelvis  Postoperative diagnosis: Urothelial carcinoma of the right renal pelvis  Procedures: 1.  Cystoscopy 2.  Right ureteroscopy with laser ablation of right renal pelvic tumor 3.  Bugbee fulguration of right renal pelvis 4.  Right ureteral stent placement (6 x 24 -no string)  Surgeon: Pryor Curia MD  Anesthesia: General  EBL: 50 cc  Intraoperative findings: There was a small, less than 1 cm area of tumor remaining in the upper pole calyx.  Indication: Wesley Snyder is an 83 year old gentleman with a history of high-grade urothelial carcinoma of the right renal pelvis.  Based on his history of having a solitary kidney, advanced age, and medical comorbidities, he did not wish to proceed with nephro ureterectomy.  He therefore has been treated with endoscopic treatment and is undergone multiple ureteroscopic laser ablation procedures.  He presents today for further diagnostic and possible therapeutic intervention.  The potential risks, complications, and alternative options associated with the above procedures were discussed in detail.  Informed consent was obtained.  Description of procedure: The patient was taken to the operating room and a general anesthetic was administered.  He was given preoperative antibiotics, placed in the dorsolithotomy position, and prepped and draped in the usual sterile fashion.  Next, a preoperative timeout was performed.  Cystourethroscopy was then performed.  The indwelling right ureteral stent was identified and brought out to the urethral meatus with a flexible grasper.  A 0.38 sensor guidewire was then advanced up the right ureteral stent into the renal pelvis under fluoroscopic guidance.  The stent was removed.  A 35 cm 12/14 ureteral access sheath was then advanced over the guidewire under fluoroscopic guidance into the proximal ureter.  At the digital flexible ureteroscope was then  advanced through the access sheath and the entire renal collecting system was visualized.  In the upper pole calyx, there was a small, less than 1 cm area of residual tumor noted.  The 200 m holmium laser fiber was then advanced through the ureteroscope and laser ablation was performed on the setting of 1 J and 8 Hz.  This resulted in complete ablation.  At the very end of the ablation, there was noted to be significant bleeding from just below the urothelium at this site.  This appeared consistent with a small arterial bleeding site.  Attempts at further laser ablation did not result in adequate hemostasis.  I therefore switched to a ureteroscopic Bugbee electrode and water for irrigation.  I fulgurated this area although visualization was poor and it was clear the bleeding continued.  After multiple attempts, visualization was not adequate to allow further fulguration.  I therefore removed the Bugbee electrode and the ureteroscope.  The ureteral access sheath was removed with a safety wire left in place.  I then placed a 6 x 24 double-J ureteral stent over the wire using Seldinger technique and positioned this appropriately under fluoroscopic and cystoscopic guidance.  The wire was then removed.  The stent appeared to be in adequate position.  Due to the concern about bleeding and blood clot formation in the bladder, I placed a 22 [place], 3-way hematuria catheter.  All clot was irrigated from the bladder and the patient was placed on light continuous bladder irrigation.  He was transferred to the recovery unit in satisfactory condition.  He will be admitted for observation to monitor his hemoglobin and to manage his hematuria.

## 2017-01-11 NOTE — Transfer of Care (Signed)
Immediate Anesthesia Transfer of Care Note  Patient: Wesley Snyder.  Procedure(s) Performed: CYSTOSCOPY/RIGHT URETEROSCOPY/HOLMIUM LASER ABLATION OF RIGHT RENAL PELVIC TUMOR/ RIGHT URETERAL STENT PLACEMENT/ FULGARATION OF RIGHT RENAL PELVIS (Right )  Patient Location: PACU  Anesthesia Type:General  Level of Consciousness: drowsy and patient cooperative  Airway & Oxygen Therapy: Patient Spontanous Breathing and Patient connected to face mask oxygen  Post-op Assessment: Report given to RN and Post -op Vital signs reviewed and stable  Post vital signs: Reviewed and stable  Last Vitals:  Vitals:   01/11/17 1129  BP: (!) 163/79  Pulse: (!) 59  Resp: 16  Temp: 36.7 C  SpO2: 95%    Last Pain:  Vitals:   01/11/17 1129  TempSrc: Oral      Patients Stated Pain Goal: 4 (28/[birth year] 3887)  Complications: No apparent anesthesia complications

## 2017-01-11 NOTE — Anesthesia Preprocedure Evaluation (Addendum)
Anesthesia Evaluation  Patient identified by MRN, date of birth, ID band Patient awake    Reviewed: Allergy & Precautions, NPO status , Patient's Chart, lab work & pertinent test results  Airway Mallampati: II  TM Distance: >3 FB Neck ROM: Full    Dental  (+) Dental Advisory Given, Chipped   Pulmonary sleep apnea , former smoker,    Pulmonary exam normal breath sounds clear to auscultation       Cardiovascular hypertension, Pt. on medications + CAD and + CABG  Normal cardiovascular exam Rhythm:Regular Rate:Normal  EKG - sinus arrhythmia with RBBB and LAFB   Neuro/Psych Anxiety negative neurological ROS     GI/Hepatic Neg liver ROS,   Endo/Other  negative endocrine ROS  Renal/GU Renal InsufficiencyRenal diseaseS/p left nephrectomy for renal cancer, right kidney urothelial cancer   Prostate cancer negative genitourinary   Musculoskeletal  (+) Arthritis ,   Abdominal   Peds negative pediatric ROS (+)  Hematology Thrombocytopenia   Anesthesia Other Findings   Reproductive/Obstetrics negative OB ROS                          Anesthesia Physical  Anesthesia Plan  ASA: III  Anesthesia Plan: General   Post-op Pain Management:    Induction: Intravenous  PONV Risk Score and Plan: 3 and Ondansetron, Dexamethasone, Propofol infusion and Treatment may vary due to age or medical condition  Airway Management Planned: LMA  Additional Equipment: None  Intra-op Plan:   Post-operative Plan: Extubation in OR  Informed Consent: I have reviewed the patients History and Physical, chart, labs and discussed the procedure including the risks, benefits and alternatives for the proposed anesthesia with the patient or authorized representative who has indicated his/her understanding and acceptance.   Dental advisory given  Plan Discussed with: CRNA  Anesthesia Plan Comments:         Anesthesia  Quick Evaluation

## 2017-01-11 NOTE — Anesthesia Procedure Notes (Signed)
Procedure Name: LMA Insertion Date/Time: 01/11/2017 2:31 PM Performed by: Montel Clock, CRNA Pre-anesthesia Checklist: Patient identified, Emergency Drugs available, Suction available, Patient being monitored and Timeout performed Patient Re-evaluated:Patient Re-evaluated prior to induction Oxygen Delivery Method: Circle system utilized Preoxygenation: Pre-oxygenation with 100% oxygen Induction Type: IV induction Ventilation: Mask ventilation without difficulty LMA: LMA with gastric port inserted LMA Size: 4.0 Number of attempts: 1 Dental Injury: Teeth and Oropharynx as per pre-operative assessment  Comments: Required several adjustments of LMA to obtain seal

## 2017-01-11 NOTE — Discharge Instructions (Signed)

## 2017-01-11 NOTE — Anesthesia Postprocedure Evaluation (Signed)
Anesthesia Post Note  Patient: Wesley Snyder.  Procedure(s) Performed: CYSTOSCOPY/RIGHT URETEROSCOPY/HOLMIUM LASER ABLATION OF RIGHT RENAL PELVIC TUMOR/ RIGHT URETERAL STENT PLACEMENT/ FULGARATION OF RIGHT RENAL PELVIS (Right )     Patient location during evaluation: PACU Anesthesia Type: General Level of consciousness: awake and alert Pain management: pain level controlled Vital Signs Assessment: post-procedure vital signs reviewed and stable Respiratory status: spontaneous breathing, nonlabored ventilation and respiratory function stable Cardiovascular status: blood pressure returned to baseline and stable Postop Assessment: no apparent nausea or vomiting Anesthetic complications: no    Last Vitals:  Vitals:   01/11/17 1630 01/11/17 1645  BP: (!) 179/82 (!) 164/77  Pulse: 63 60  Resp: 15 12  Temp:  36.4 C  SpO2: 100% 98%    Last Pain:  Vitals:   01/11/17 1645  TempSrc:   PainSc: [place]

## 2017-01-11 NOTE — Progress Notes (Signed)
Patient ID: Wesley Danes., male   DOB: 03-25-1932, 83 y.o.   MRN: [id number]  Post-op note  Subjective: The patient is doing well.  No complaints.  Objective: Vital signs in last 24 hours: Temp:  [97.6 F (36.4 C)-98 F (36.7 C)] 97.7 F (36.5 C) (11/15 1715) Pulse Rate:  [59-72] 71 (11/15 1715) Resp:  [12-20] 15 (11/15 1715) BP: (163-190)/(73-92) 179/78 (11/15 1715) SpO2:  [95 %-100 %] 98 % (11/15 1715) Weight:  [79.8 kg (176 lb)] 79.8 kg (176 lb) (11/15 1145)  Intake/Output from previous day: No intake/output data recorded. Intake/Output this shift: Total I/O In: 1850 [I.V.:1050; Other:800] Out: 1100 [Urine:1050; Blood:50]  Physical Exam:  General: Alert and oriented. Abdomen: Soft, Nondistended. Incisions: Clean and dry. GU: Urine pink on minimal CBI.  Lab Results: Recent Labs    01/11/17 1614  HGB 11.9*  HCT 34.9*    Assessment/Plan: POD#0   1) Continue to monitor Hgb overnight and titrate down CBI.   Wesley Snyder, Wesley Snyder. MD   LOS: 0 days   Wesley Snyder,LES 01/11/2017, 5:42 PM

## 2017-01-12 ENCOUNTER — Encounter (HOSPITAL_COMMUNITY): Payer: Self-pay | Admitting: Urology

## 2017-01-12 DIAGNOSIS — C651 Malignant neoplasm of right renal pelvis: Secondary | ICD-10-CM | POA: Diagnosis not present

## 2017-01-12 LAB — BASIC METABOLIC PANEL
ANION GAP: 7 (ref 5–15)
BUN: 22 mg/dL — ABNORMAL HIGH (ref 6–20)
CALCIUM: 9 mg/dL (ref 8.9–10.3)
CO2: 24 mmol/L (ref 22–32)
Chloride: 109 mmol/L (ref 101–111)
Creatinine, Ser: 1.25 mg/dL — ABNORMAL HIGH (ref 0.61–1.24)
GFR calc non Af Amer: 52 mL/min — ABNORMAL LOW (ref >60)
Glucose, Bld: 131 mg/dL — ABNORMAL HIGH (ref 65–99)
POTASSIUM: 4.3 mmol/L (ref 3.5–5.1)
Sodium: 140 mmol/L (ref 135–145)

## 2017-01-12 LAB — HEMOGLOBIN AND HEMATOCRIT, BLOOD
HCT: 36 % — ABNORMAL LOW (ref 39.0–52.0)
HEMOGLOBIN: 12.1 g/dL — AB (ref 13.0–17.0)

## 2017-01-12 NOTE — Discharge Summary (Signed)
Date of admission: 01/11/2017  Date of discharge: 01/12/2017  Admission diagnosis: Right renal pelvic tumor  Discharge diagnosis: Right renal pelvic tumor  History and Physical: For full details, please see admission history and physical. Briefly, Wesley Snyder. is a 83 y.o. year old patient with high-grade urothelial carcinoma of the right renal pelvis.   Hospital Course: He was taken to the operating room on 01/11/17.  He underwent right ureteroscopy with laser lithotripsy of his remaining small tumor burden.  He unfortunately developed significant bleeding during this and required ureteral stent placement and catheter placement.  He was maintained on CBI overnight and this was able to be discontinued the following morning.  He was given a voiding trial and was able to be discharged home on 01/12/17.  Laboratory values:  Recent Labs    01/11/17 1614 01/12/17 0438  HGB 11.9* 12.1*  HCT 34.9* 36.0*   Recent Labs    01/11/17 1614 01/12/17 0438  CREATININE 1.33* 1.25*    Disposition: Home  Discharge instruction: The patient was instructed to be ambulatory but told to refrain from heavy lifting, strenuous activity, or driving.  Discharge medications:  Allergies as of 01/12/2017   No Known Allergies     Medication List    STOP taking these medications   aspirin 325 MG tablet   VITAMIN B-12 PO     TAKE these medications   HYDROcodone-acetaminophen 5-325 MG tablet Commonly known as:  NORCO/VICODIN Take 1-2 tablets every 6 (six) hours as needed by mouth.   phenazopyridine 100 MG tablet Commonly known as:  PYRIDIUM Take 1 tablet (100 mg total) 3 (three) times daily as needed by mouth for pain (for burning).       Followup:  Follow-up Information    Raynelle Bring, MD.   Specialty:  Urology Why:  01/23/17 at 10:30 AM Contact information: [place] [address] [phone]

## 2017-01-12 NOTE — Progress Notes (Signed)
Patient ID: Wesley Danes., male   DOB: 1932-08-30, 83 y.o.   MRN: [id number]  1 Day Post-Op Subjective: Pt without problems overnight.  Did not require irrigation by hand.  HD stable.  Objective: Vital signs in last 24 hours: Temp:  [97.6 F (36.4 C)-98 F (36.7 C)] 97.7 F (36.5 C) (11/16 0614) Pulse Rate:  [59-72] 60 (11/16 0614) Resp:  [12-20] 18 (11/16 0614) BP: (119-190)/(73-92) 119/83 (11/16 0614) SpO2:  [95 %-100 %] 100 % (11/16 0614) Weight:  [79.8 kg (176 lb)] 79.8 kg (176 lb) (11/15 1145)  Intake/Output from previous day: 11/15 0701 - 11/16 0700 In: 3050 [P.O.:1200; I.V.:1050] Out: 6500 [Urine:6450; Blood:50] Intake/Output this shift: No intake/output data recorded.  Physical Exam:  General: Alert and oriented GU: Urine light red off CBI.  Lab Results: Recent Labs    01/11/17 1614 01/12/17 0438  HGB 11.9* 12.1*  HCT 34.9* 36.0*   BMET Recent Labs    01/11/17 1614 01/12/17 0438  NA 142 140  K 3.8 4.3  CL 109 109  CO2 25 24  GLUCOSE 101* 131*  BUN 18 22*  CREATININE 1.33* 1.25*  CALCIUM 8.9 9.0     Studies/Results: Dg C-arm 1-60 Min-no Report  Result Date: 01/11/2017 Fluoroscopy was utilized by the requesting physician.  No radiographic interpretation.    Assessment/Plan: D/C catheter for voiding trial.  Will d/c home after voids.  Encouraged po hydration.  Discussed options of leaving stent and proceeding with BCG after urine clears in a few weeks vs stent removal and observation/surveillance.  He and his wife will discuss and we will make a final plan at f/u visit in a couple of weeks.   LOS: 0 days   Salim Forero,LES 01/12/2017, 7:31 AM

## 2017-05-15 ENCOUNTER — Other Ambulatory Visit: Payer: Self-pay | Admitting: Urology

## 2017-06-29 NOTE — Patient Instructions (Addendum)
Wesley Snyder.  06/29/2017   Your procedure is scheduled on: 07-05-17   Report to [facility] Main  Entrance   Report to admitting at 9:30 AM    Call this number if you have problems the morning of surgery [phone]   Remember: Do not eat food or drink liquids :After Midnight.     Take these medicines the morning of surgery with A SIP OF WATER: None                                 You may not have any metal on your body including hair pins and              piercings  Do not wear jewelry, lotions, powders or deodorant             Men may shave face and neck.   Do not bring valuables to the hospital. Wesley Snyder.  Contacts, dentures or bridgework may not be worn into surgery.       Patients discharged the day of surgery will not be allowed to drive home.  Name and phone number of your driver:  Special Instructions: Wesley Snyder [phone]              Please read over the following fact sheets you were given: _____________________________________________________________________             [facility] - Preparing for Surgery Before surgery, you can play an important role.  Because skin is not sterile, your skin needs to be as free of germs as possible.  You can reduce the number of germs on your skin by washing with CHG (chlorahexidine gluconate) soap before surgery.  CHG is an antiseptic cleaner which kills germs and bonds with the skin to continue killing germs even after washing. Please DO NOT use if you have an allergy to CHG or antibacterial soaps.  If your skin becomes reddened/irritated stop using the CHG and inform your nurse when you arrive at Short Stay. Do not shave (including legs and underarms) for at least 48 hours prior to the first CHG shower.  You may shave your face/neck. Please follow these instructions carefully:  1.  Shower with CHG Soap the night before surgery and the  morning of  Surgery.  2.  If you choose to wash your hair, wash your hair first as usual with your  normal  shampoo.  3.  After you shampoo, rinse your hair and body thoroughly to remove the  shampoo.                           4.  Use CHG as you would any other liquid soap.  You can apply chg directly  to the skin and wash                       Gently with a scrungie or clean washcloth.  5.  Apply the CHG Soap to your body ONLY FROM THE NECK DOWN.   Do not use on face/ open  Wound or open sores. Avoid contact with eyes, ears mouth and genitals (private parts).                       Wash face,  Genitals (private parts) with your normal soap.             6.  Wash thoroughly, paying special attention to the area where your surgery  will be performed.  7.  Thoroughly rinse your body with warm water from the neck down.  8.  DO NOT shower/wash with your normal soap after using and rinsing off  the CHG Soap.                9.  Pat yourself dry with a clean towel.            10.  Wear clean pajamas.            11.  Place clean sheets on your bed the night of your first shower and do not  sleep with pets. Day of Surgery : Do not apply any lotions/deodorants the morning of surgery.  Please wear clean clothes to the hospital/surgery center.  FAILURE TO FOLLOW THESE INSTRUCTIONS MAY RESULT IN THE CANCELLATION OF YOUR SURGERY PATIENT SIGNATURE_________________________________  NURSE SIGNATURE__________________________________  ________________________________________________________________________

## 2017-06-29 NOTE — Progress Notes (Addendum)
  04-30-17 EKG on chart  01-11-17 (Epic) EKG  10-12-16 ECHO on chart from Dr. Sharyon Cable  09-27-16 (Epic) CXR  04-30-17 EKG and 10-12-16 ECHO reviewed with Dr. Ambrose Pancoast. Pt okay to proceed with surgery.

## 2017-07-02 ENCOUNTER — Encounter (HOSPITAL_COMMUNITY)
Admission: RE | Admit: 2017-07-02 | Discharge: 2017-07-02 | Disposition: A | Discharge status: Home / Self Care | Payer: Medicare Other | Source: Ambulatory Visit | Attending: Urology | Admitting: Urology

## 2017-07-02 ENCOUNTER — Other Ambulatory Visit: Payer: Self-pay

## 2017-07-02 ENCOUNTER — Encounter (HOSPITAL_COMMUNITY): Payer: Self-pay

## 2017-07-02 DIAGNOSIS — I10 Essential (primary) hypertension: Secondary | ICD-10-CM | POA: Diagnosis not present

## 2017-07-02 DIAGNOSIS — Z905 Acquired absence of kidney: Secondary | ICD-10-CM | POA: Diagnosis not present

## 2017-07-02 DIAGNOSIS — C651 Malignant neoplasm of right renal pelvis: Secondary | ICD-10-CM | POA: Diagnosis not present

## 2017-07-02 DIAGNOSIS — Z87891 Personal history of nicotine dependence: Secondary | ICD-10-CM | POA: Diagnosis not present

## 2017-07-02 DIAGNOSIS — G4730 Sleep apnea, unspecified: Secondary | ICD-10-CM | POA: Diagnosis not present

## 2017-07-02 DIAGNOSIS — Z8546 Personal history of malignant neoplasm of prostate: Secondary | ICD-10-CM | POA: Diagnosis not present

## 2017-07-02 DIAGNOSIS — I2510 Atherosclerotic heart disease of native coronary artery without angina pectoris: Secondary | ICD-10-CM | POA: Diagnosis not present

## 2017-07-02 DIAGNOSIS — Z951 Presence of aortocoronary bypass graft: Secondary | ICD-10-CM | POA: Diagnosis not present

## 2017-07-02 LAB — BASIC METABOLIC PANEL
Anion gap: 6 (ref 5–15)
BUN: 26 mg/dL — ABNORMAL HIGH (ref 6–20)
CO2: 23 mmol/L (ref 22–32)
Calcium: 10 mg/dL (ref 8.9–10.3)
Chloride: 112 mmol/L — ABNORMAL HIGH (ref 101–111)
Creatinine, Ser: 1.34 mg/dL — ABNORMAL HIGH (ref 0.61–1.24)
GFR calc Af Amer: 55 mL/min — ABNORMAL LOW (ref >60)
GFR calc non Af Amer: 48 mL/min — ABNORMAL LOW (ref >60)
Glucose, Bld: 96 mg/dL (ref 65–99)
Potassium: 5.2 mmol/L — ABNORMAL HIGH (ref 3.5–5.1)
Sodium: 141 mmol/L (ref 135–145)

## 2017-07-02 LAB — CBC
HCT: 43 % (ref 39.0–52.0)
Hemoglobin: 14.5 g/dL (ref 13.0–17.0)
MCH: 30.7 pg (ref 26.0–34.0)
MCHC: 33.7 g/dL (ref 30.0–36.0)
MCV: 91.1 fL (ref 78.0–100.0)
Platelets: 122 10*3/uL — ABNORMAL LOW (ref 150–400)
RBC: 4.72 MIL/uL (ref 4.22–5.81)
RDW: 13 % (ref 11.5–15.5)
WBC: 6.8 10*3/uL (ref 4.0–10.5)

## 2017-07-04 NOTE — H&P (Signed)
CC/HPI:  1. Urothelial carcinoma of the right renal collecting system  2. Solitary right kidney   [name] is an 84 year old gentleman who follows up today for further evaluation of his high-grade urothelial carcinoma of the right renal collecting system. He had undergone multiple endoscopic laser ablation procedures finally rendering him disease free by November of 2018. He returns today after undergoing a CT scan to assess for any locally advanced disease or metastatic disease. He has denied any recent hematuria. He feels that his voiding symptoms including his incontinence are back to baseline prior to his endoscopic procedures.     ALLERGIES: None   MEDICATIONS: Aspirin  Lisinopril     GU PSH: Cysto Remove Stent FB Sim - 01/23/2017 Cysto Uretero Biopsy Fulgura, Right - 11/16/2016 Cysto Uretero W/excise Tumor, Right - 01/11/2017, Right - 10/23/2016 Cystoscopy Insert Stent, Right - 01/11/2017, Right - 11/16/2016, Right - 10/23/2016 Locm 300-399Mg /Ml Iodine,1Ml - 04/27/2017    NON-GU PSH: Coronary Artery Bypass Grafting    GU PMH: Renal pelvis cancer, right - 11/07/2016 Gross hematuria - 09/27/2016 History of kidney cancer - 09/27/2016 Prostate Cancer - 09/27/2016 Right renal neoplasm - 09/27/2016      PMH Notes:   1) Renal cell carcinoma: He is s/p an open left radical nephrectomy at [facility] in 1994.   2) Prostate cancer: He is s/p an open RRP at [facility] in 1994. He apparently had a biochemical recurrence soon after primary therapy and has been on systemic therapy with ADT since then under his urologist in [place].   3) Urothelial carcinoma of the right kidney: He was found to have a right renal collecting system tumor on imaging in 2018 in his solitary right kidney. This was confirmed on ureteroscopic evaluation to be high grade urothelial carcinoma.   Aug 2018: Right ureteroscopy with laser ablation of right renal pelvic tumor - pathology indicated high-grade  urothelial carcinoma  Sep 2018: Repeat right ureteroscopy with laser ablation of remaining right renal pelvic tumor - pathology confirmed high-grade urothelial carcinoma with a suspicious focus of lamina propria invasion  Nov 2018: Repeat right ureteroscopy - Minimal tumor remaining and completely laser ablated       NON-GU PMH: Bacteriuria - 11/28/2016 Coronary Artery Disease Sleep Apnea    FAMILY HISTORY: 1 Daughter - Runs in Family 2 sons - Runs in Family Cirrhosis - Father   SOCIAL HISTORY: Marital Status: Married Preferred Language: English; Ethnicity: Not Hispanic Or Latino; Race: White Current Smoking Status: Patient has never smoked.   Tobacco Use Assessment Completed: Used Tobacco in last 30 days? Has never drank.  Drinks 1 caffeinated drink per day.    REVIEW OF SYSTEMS:    GU Review Male:   Patient reports frequent urination. Patient denies hard to postpone urination, burning/ pain with urination, leakage of urine, stream starts and stops, trouble starting your streams, and have to strain to urinate .  Gastrointestinal (Upper):   Patient denies nausea and vomiting.  Gastrointestinal (Lower):   Patient denies diarrhea and constipation.  Constitutional:   Patient denies fever, night sweats, weight loss, and fatigue.  Skin:   Patient denies skin rash/ lesion and itching.  Eyes:   Patient denies blurred vision and double vision.  Ears/ Nose/ Throat:   Patient denies sore throat and sinus problems.  Hematologic/Lymphatic:   Patient reports easy bruising. Patient denies swollen glands.  Cardiovascular:   Patient denies leg swelling and chest pains.  Respiratory:   Patient denies cough and  shortness of breath.  Endocrine:   Patient denies excessive thirst.  Musculoskeletal:   Patient denies back pain and joint pain.  Neurological:   Patient denies headaches and dizziness.  Psychologic:   Patient denies depression and anxiety.   VITAL SIGNS:     Weight 180 lb / 81.65 kg   Height 70 in / 177.8 cm  BMI 25.8 kg/m   MULTI-SYSTEM PHYSICAL EXAMINATION:    Constitutional: Well-nourished. No physical deformities. Normally developed. Good grooming.  Neck: Neck symmetrical, not swollen. Normal tracheal position.  Respiratory: No labored breathing, no use of accessory muscles. Normal breath sounds. Clear bilaterally.  Cardiovascular: Regular rate and rhythm. No murmur, no gallop. Normal temperature, normal extremity pulses, no swelling, no varicosities.  Lymphatic: No enlargement of neck, axillae, groin.  Skin: No paleness, no jaundice, no cyanosis. No lesion, no ulcer, no rash.  Neurologic / Psychiatric: Oriented to time, oriented to place, oriented to person. No depression, no anxiety, no agitation.  Gastrointestinal: No mass, no tenderness, no rigidity, non obese abdomen.  Eyes: Normal conjunctivae. Normal eyelids.  Ears, Nose, Mouth, and Throat: Left ear no scars, no lesions, no masses. Right ear no scars, no lesions, no masses. Nose no scars, no lesions, no masses. Normal hearing. Normal lips.  Musculoskeletal: Normal gait and station of head and neck.        ASSESSMENT:      ICD-10 Details  1 GU:   Renal pelvis cancer, right - C65.1    PLAN:       1. High-grade urothelial carcinoma of the right collecting system: He will proceed with repeat cystoscopy and right ureteroscopy of a solitary right kidney and ureter over the next couple of months. We reviewed this procedure in detail including the potential risks, complications, and expected recovery process. He understands that we would be prepared to proceed with laser ablation if recurrent tumor is identified.

## 2017-07-05 ENCOUNTER — Ambulatory Visit (HOSPITAL_COMMUNITY): Payer: Medicare Other | Admitting: Certified Registered Nurse Anesthetist

## 2017-07-05 ENCOUNTER — Encounter (HOSPITAL_COMMUNITY)
Admission: RE | Disposition: A | Discharge status: Home / Self Care | Payer: Self-pay | Source: Ambulatory Visit | Attending: Urology

## 2017-07-05 ENCOUNTER — Ambulatory Visit (HOSPITAL_COMMUNITY)
Admission: RE | Admit: 2017-07-05 | Discharge: 2017-07-05 | Disposition: A | Discharge status: Home / Self Care | Payer: Medicare Other | Source: Ambulatory Visit | Attending: Urology | Admitting: Urology

## 2017-07-05 ENCOUNTER — Encounter (HOSPITAL_COMMUNITY): Payer: Self-pay | Admitting: *Deleted

## 2017-07-05 ENCOUNTER — Ambulatory Visit (HOSPITAL_COMMUNITY): Payer: Medicare Other

## 2017-07-05 ENCOUNTER — Other Ambulatory Visit: Payer: Self-pay

## 2017-07-05 DIAGNOSIS — C651 Malignant neoplasm of right renal pelvis: Secondary | ICD-10-CM | POA: Diagnosis not present

## 2017-07-05 DIAGNOSIS — G4730 Sleep apnea, unspecified: Secondary | ICD-10-CM | POA: Insufficient documentation

## 2017-07-05 DIAGNOSIS — Z951 Presence of aortocoronary bypass graft: Secondary | ICD-10-CM | POA: Insufficient documentation

## 2017-07-05 DIAGNOSIS — I2510 Atherosclerotic heart disease of native coronary artery without angina pectoris: Secondary | ICD-10-CM | POA: Insufficient documentation

## 2017-07-05 DIAGNOSIS — Z87891 Personal history of nicotine dependence: Secondary | ICD-10-CM | POA: Insufficient documentation

## 2017-07-05 DIAGNOSIS — Z905 Acquired absence of kidney: Secondary | ICD-10-CM | POA: Insufficient documentation

## 2017-07-05 DIAGNOSIS — I10 Essential (primary) hypertension: Secondary | ICD-10-CM | POA: Diagnosis not present

## 2017-07-05 DIAGNOSIS — Z8546 Personal history of malignant neoplasm of prostate: Secondary | ICD-10-CM | POA: Insufficient documentation

## 2017-07-05 HISTORY — PX: CYSTOSCOPY/RETROGRADE/URETEROSCOPY: SHX5316

## 2017-07-05 SURGERY — CYSTOSCOPY/RETROGRADE/URETEROSCOPY
Anesthesia: General | Site: Urethra | Laterality: Right

## 2017-07-05 MED ORDER — DEXAMETHASONE SODIUM PHOSPHATE 10 MG/ML IJ SOLN
INTRAMUSCULAR | Status: DC | PRN
  Administered 2017-07-05: 10 mg via INTRAVENOUS

## 2017-07-05 MED ORDER — EPHEDRINE 5 MG/ML INJ
INTRAVENOUS | Status: AC
  Filled 2017-07-05: qty 10

## 2017-07-05 MED ORDER — CEFAZOLIN SODIUM-DEXTROSE 2-4 GM/100ML-% IV SOLN
2.0000 g | Freq: Once | INTRAVENOUS | Status: AC
  Administered 2017-07-05: 2 g via INTRAVENOUS
  Filled 2017-07-05: qty 100

## 2017-07-05 MED ORDER — IOPAMIDOL (ISOVUE-300) INJECTION 61%
INTRAVENOUS | Status: DC | PRN
  Administered 2017-07-05: 7 mL via URETHRAL

## 2017-07-05 MED ORDER — SODIUM CHLORIDE 0.9 % IR SOLN
Status: DC | PRN
  Administered 2017-07-05: 3000 mL via INTRAVESICAL
  Administered 2017-07-05: 1000 mL via INTRAVESICAL

## 2017-07-05 MED ORDER — SODIUM CHLORIDE 0.9 % IV SOLN
INTRAVENOUS | Status: DC
  Administered 2017-07-05: 10:00:00 via INTRAVENOUS

## 2017-07-05 MED ORDER — ONDANSETRON HCL 4 MG/2ML IJ SOLN
INTRAMUSCULAR | Status: DC | PRN
  Administered 2017-07-05: 4 mg via INTRAVENOUS

## 2017-07-05 MED ORDER — PHENAZOPYRIDINE HCL 200 MG PO TABS: 200.0000 mg | ORAL_TABLET | Freq: Three times a day (TID) | ORAL | 0 refills | Status: DC | PRN

## 2017-07-05 MED ORDER — EPHEDRINE SULFATE-NACL 50-0.9 MG/10ML-% IV SOSY
PREFILLED_SYRINGE | INTRAVENOUS | Status: DC | PRN
  Administered 2017-07-05: 5 mg via INTRAVENOUS

## 2017-07-05 MED ORDER — FENTANYL CITRATE (PF) 100 MCG/2ML IJ SOLN
INTRAMUSCULAR | Status: DC | PRN
  Administered 2017-07-05: 25 ug via INTRAVENOUS

## 2017-07-05 MED ORDER — FENTANYL CITRATE (PF) 100 MCG/2ML IJ SOLN: 25.0000 ug | INTRAMUSCULAR | Status: DC | PRN

## 2017-07-05 MED ORDER — 0.9 % SODIUM CHLORIDE (POUR BTL) OPTIME
TOPICAL | Status: DC | PRN
  Administered 2017-07-05: 1000 mL

## 2017-07-05 MED ORDER — PROPOFOL 10 MG/ML IV BOLUS
INTRAVENOUS | Status: AC
Start: 2017-07-05 — End: ?
  Filled 2017-07-05: qty 20

## 2017-07-05 MED ORDER — FENTANYL CITRATE (PF) 100 MCG/2ML IJ SOLN
INTRAMUSCULAR | Status: AC
  Filled 2017-07-05: qty 2

## 2017-07-05 MED ORDER — PROPOFOL 10 MG/ML IV BOLUS
INTRAVENOUS | Status: AC
  Filled 2017-07-05: qty 20

## 2017-07-05 MED ORDER — PROPOFOL 10 MG/ML IV BOLUS
INTRAVENOUS | Status: DC | PRN
  Administered 2017-07-05: 140 mg via INTRAVENOUS

## 2017-07-05 MED ORDER — LIDOCAINE 2% (20 MG/ML) 5 ML SYRINGE
INTRAMUSCULAR | Status: DC | PRN
  Administered 2017-07-05: 100 mg via INTRAVENOUS

## 2017-07-05 SURGICAL SUPPLY — 21 items
BAG URO CATCHER STRL LF (MISCELLANEOUS) ×2 IMPLANT
BASKET ZERO TIP NITINOL 2.4FR (BASKET) IMPLANT
BSKT STON RTRVL ZERO TP 2.4FR (BASKET)
CATH INTERMIT  6FR 70CM (CATHETERS) ×1 IMPLANT
CLOTH BEACON ORANGE TIMEOUT ST (SAFETY) ×2 IMPLANT
COVER FOOTSWITCH UNIV (MISCELLANEOUS) IMPLANT
COVER SURGICAL LIGHT HANDLE (MISCELLANEOUS) ×2 IMPLANT
FIBER LASER FLEXIVA 365 (UROLOGICAL SUPPLIES) IMPLANT
FIBER LASER TRAC TIP (UROLOGICAL SUPPLIES) IMPLANT
GLOVE BIOGEL M STRL SZ7.5 (GLOVE) ×2 IMPLANT
GOWN STRL REUS W/TWL LRG LVL3 (GOWN DISPOSABLE) ×4 IMPLANT
GUIDEWIRE ANG ZIPWIRE 038X150 (WIRE) IMPLANT
GUIDEWIRE STR DUAL SENSOR (WIRE) ×2 IMPLANT
IV NS 1000ML (IV SOLUTION) ×2
IV NS 1000ML BAXH (IV SOLUTION) ×1 IMPLANT
MANIFOLD NEPTUNE II (INSTRUMENTS) ×2 IMPLANT
PACK CYSTO (CUSTOM PROCEDURE TRAY) ×2 IMPLANT
SHEATH URETERAL 12FRX35CM (MISCELLANEOUS) IMPLANT
STENT URET 6FRX26 CONTOUR (STENTS) ×2 IMPLANT
TUBING CONNECTING 10 (TUBING) ×2 IMPLANT
TUBING UROLOGY SET (TUBING) ×2 IMPLANT

## 2017-07-05 NOTE — Anesthesia Postprocedure Evaluation (Signed)
Anesthesia Post Note  Patient: Wesley Snyder.  Procedure(s) Performed: CYSTOSCOPY/RETROGRADE/URETEROSCOPY/ STENT PLACEMENT (Right Urethra)     Patient location during evaluation: PACU Anesthesia Type: General Level of consciousness: awake Pain management: pain level controlled Respiratory status: spontaneous breathing Cardiovascular status: stable Anesthetic complications: no    Last Vitals:  Vitals:   07/05/17 1255 07/05/17 1258  BP: (!) 158/78 (!) 167/90  Pulse: (!) 48 (!) 54  Resp: 16 17  Temp:  (!) 36.4 C  SpO2: 99% 98%    Last Pain:  Vitals:   07/05/17 1255  TempSrc:   PainSc: 0-No pain                 Nussen Pullin

## 2017-07-05 NOTE — Transfer of Care (Signed)
Immediate Anesthesia Transfer of Care Note  Patient: Wesley Snyder.  Procedure(s) Performed: CYSTOSCOPY/RETROGRADE/URETEROSCOPY/ STENT PLACEMENT (Right Urethra)  Patient Location: PACU  Anesthesia Type:General  Level of Consciousness: awake, alert , oriented and patient cooperative  Airway & Oxygen Therapy: Patient Spontanous Breathing and Patient connected to face mask oxygen  Post-op Assessment: Report given to RN, Post -op Vital signs reviewed and stable and Patient moving all extremities  Post vital signs: Reviewed and stable  Last Vitals:  Vitals Value Taken Time  BP 154/76 07/05/2017 12:27 PM  Temp    Pulse 52 07/05/2017 12:28 PM  Resp 12 07/05/2017 12:28 PM  SpO2 100 % 07/05/2017 12:28 PM  Vitals shown include unvalidated device data.  Last Pain:  Vitals:   07/05/17 0935  TempSrc: Oral      Patients Stated Pain Goal: 3 (95/36/92 2300)  Complications: No apparent anesthesia complications

## 2017-07-05 NOTE — Discharge Instructions (Addendum)
1. You may see some blood in the urine and may have some burning with urination for 48-72 hours. You also may notice that you have to urinate more frequently or urgently after your procedure which is normal.  2. You should call should you develop an inability urinate, fever > 101, persistent nausea and vomiting that prevents you from eating or drinking to stay hydrated.  3. If you have a stent, you will likely urinate more frequently and urgently until the stent is removed and you may experience some discomfort/pain in the lower abdomen and flank especially when urinating. You may take pain medication prescribed to you if needed for pain. You may also intermittently have blood in the urine until the stent is removed.       4.   You may remove your stent on MONDAY morning.  Simply pull the string that is taped to your body and the stent will easily come out.  This may be best done in the shower as some urine may come out with the stent.  Usually you will feel relief once the stent is removed, but occasionally patients can develop pain due to residual swelling of the ureter that may temporarily obstruct the kidney.  This can be managed by taking pain medication and it will typically resolve with time.  Please do not hesitate to call if you have pain that is not controlled with your pain medication or does not improved within 24-48 hours.

## 2017-07-05 NOTE — Anesthesia Procedure Notes (Signed)
Procedure Name: LMA Insertion Date/Time: 07/05/2017 11:47 AM Performed by: Glory Buff, CRNA Pre-anesthesia Checklist: Patient identified Patient Re-evaluated:Patient Re-evaluated prior to induction Oxygen Delivery Method: Circle system utilized Preoxygenation: Pre-oxygenation with 100% oxygen Induction Type: IV induction Ventilation: Mask ventilation without difficulty LMA: LMA with gastric port inserted LMA Size: 4.0 Number of attempts: 1

## 2017-07-05 NOTE — Anesthesia Preprocedure Evaluation (Addendum)
Anesthesia Evaluation  Patient identified by MRN, date of birth, ID band Patient awake    Reviewed: Allergy & Precautions, NPO status , Patient's Chart, lab work & pertinent test results  Airway Mallampati: II  TM Distance: >3 FB     Dental   Pulmonary pneumonia, former smoker,    breath sounds clear to auscultation       Cardiovascular hypertension,  Rhythm:Regular Rate:Normal     Neuro/Psych    GI/Hepatic Neg liver ROS, GERD  ,  Endo/Other    Renal/GU Renal disease     Musculoskeletal  (+) Arthritis ,   Abdominal   Peds  Hematology   Anesthesia Other Findings   Reproductive/Obstetrics                             Anesthesia Physical Anesthesia Plan  ASA: III  Anesthesia Plan: General   Post-op Pain Management:    Induction: Intravenous  PONV Risk Score and Plan: Treatment may vary due to age or medical condition  Airway Management Planned: LMA  Additional Equipment:   Intra-op Plan:   Post-operative Plan: Extubation in OR  Informed Consent: I have reviewed the patients History and Physical, chart, labs and discussed the procedure including the risks, benefits and alternatives for the proposed anesthesia with the patient or authorized representative who has indicated his/her understanding and acceptance.   Dental advisory given  Plan Discussed with: CRNA and Anesthesiologist  Anesthesia Plan Comments:         Anesthesia Quick Evaluation

## 2017-07-05 NOTE — Op Note (Signed)
Preoperative diagnosis: Urothelial carcinoma of the right renal pelvis with solitary right kidney  Postoperative diagnosis: Urothelial carcinoma of the right renal pelvis with solitary right kidney  Procedures: 1.  Cystoscopy 2.  Right retrograde pyelography with interpretation 3.  Right ureteroscopy 4.  Right ureteral stent placement (6 x 26-with string)  Surgeon: Wesley Curia MD  Anesthesia: General  Complications: None  EBL: Minimal  Intraoperative findings: Right retrograde pyelography was performed with a 6 French ureteral catheter and Omnipaque contrast.  This revealed a normal caliber ureter without filling defects.  The right renal collecting system also appeared without filling defects or other abnormalities.  Specimens: None  Indication: Wesley Snyder is an 84 year old gentleman with a history of a solitary right kidney and known high-grade urothelial carcinoma of the right renal collecting system.  He has been treated with multiple ureteroscopic laser ablation procedures and returns today for surveillance.  We discussed proceeding with the above procedures.  We reviewed the potential risks and complications as well as the expected recovery process.  He gave informed consent.  Description of procedure: The patient was taken to the operating room and a general anesthetic was administered peer he was given preoperative antibiotics, placed in the dorsal lithotomy position, and prepped and draped in the usual sterile fashion.  A preoperative timeout was performed.  Cystourethroscopy was then performed with a 22 French cystoscope.  Both a 30 degree and 70 degree lens were used to inspect the bladder systematically.  No bladder tumors, stones, or other mucosal pathology was identified.  The right ureteral orifice was then identified and intubated with a 6 French ureteral catheter.  Omnipaque contrast was injected with findings as dictated above.  In summary, no abnormalities were  identified.  Considering his history of high-grade disease of the right renal collecting system, I did elect to proceed with direct visualization.  A 0.38 sensor guidewire was advanced up the right ureter into the right renal collecting system under fluoroscopic guidance.  I inserted a flexible digital ureteroscope next to this wire was able to navigated approximately halfway up the ureter.  I then placed a Glidewire through the ureteroscope to help navigate the ureteroscope up into the right renal collecting system.  A systematic examination of the right renal collecting system was performed.  No tumors, stones, or other mucosal pathology was identified.  Specifically, and the previously ablated tumor site in the upper pole calyx, no abnormalities were found.  The ureteroscope was then withdrawn and the sensor wire was left in place.  This wire was then backloaded on the cystoscope and a 6 x 26 double-J ureteral stent was advanced over the wire using Seldinger technique and positioned appropriately under fluoroscopic and cystoscopic guidance.  The wire was removed with a good curl noted in the renal pelvis as well as within the bladder.  The patient's bladder was emptied.  A string tether was left in place and the patient will plan to remove his stent next Monday.  He was able to be awakened and transferred to the recovery unit in satisfactory condition.

## 2017-11-30 ENCOUNTER — Other Ambulatory Visit: Payer: Self-pay | Admitting: Urology

## 2017-12-17 NOTE — Patient Instructions (Signed)
Wesley Snyder.  12/17/2017   Your procedure is scheduled on: 12-24-17     Report to [facility] Main  Entrance    Report to Admitting at 6:15 AM     Remember: Do not eat food or drink liquids :After Midnight.    BRUSH YOUR TEETH MORNING OF SURGERY AND RINSE YOUR MOUTH OUT, NO CHEWING GUM CANDY OR MINTS.     Take these medicines the morning of surgery with A SIP OF WATER: None                                 You may not have any metal on your body including hair pins and              piercings  Do not wear jewelry, lotions, powders, cologne or deodorant             Men may shave face and neck.   Do not bring valuables to the hospital. [place].  Contacts, dentures or bridgework may not be worn into surgery.  Leave suitcase in the car. After surgery it may be brought to your room.     Patients discharged the day of surgery will not be allowed to drive home.  Name and phone number of your driver:  Special Instructions: N/A              Please read over the following fact sheets you were given: _____________________________________________________________________             [facility] - Preparing for Surgery Before surgery, you can play an important role.  Because skin is not sterile, your skin needs to be as free of germs as possible.  You can reduce the number of germs on your skin by washing with CHG (chlorahexidine gluconate) soap before surgery.  CHG is an antiseptic cleaner which kills germs and bonds with the skin to continue killing germs even after washing. Please DO NOT use if you have an allergy to CHG or antibacterial soaps.  If your skin becomes reddened/irritated stop using the CHG and inform your nurse when you arrive at Short Stay. Do not shave (including legs and underarms) for at least 48 hours prior to the first CHG shower.  You may shave your face/neck. Please follow these  instructions carefully:  1.  Shower with CHG Soap the night before surgery and the  morning of Surgery.  2.  If you choose to wash your hair, wash your hair first as usual with your  normal  shampoo.  3.  After you shampoo, rinse your hair and body thoroughly to remove the  shampoo.                           4.  Use CHG as you would any other liquid soap.  You can apply chg directly  to the skin and wash                       Gently with a scrungie or clean washcloth.  5.  Apply the CHG Soap to your body ONLY FROM THE NECK DOWN.   Do not use on face/  open                           Wound or open sores. Avoid contact with eyes, ears mouth and genitals (private parts).                       Wash face,  Genitals (private parts) with your normal soap.             6.  Wash thoroughly, paying special attention to the area where your surgery  will be performed.  7.  Thoroughly rinse your body with warm water from the neck down.  8.  DO NOT shower/wash with your normal soap after using and rinsing off  the CHG Soap.                9.  Pat yourself dry with a clean towel.            10.  Wear clean pajamas.            11.  Place clean sheets on your bed the night of your first shower and do not  sleep with pets. Day of Surgery : Do not apply any lotions/deodorants the morning of surgery.  Please wear clean clothes to the hospital/surgery center.  FAILURE TO FOLLOW THESE INSTRUCTIONS MAY RESULT IN THE CANCELLATION OF YOUR SURGERY PATIENT SIGNATURE_________________________________  NURSE SIGNATURE__________________________________  ________________________________________________________________________

## 2017-12-18 ENCOUNTER — Other Ambulatory Visit: Payer: Self-pay

## 2017-12-18 ENCOUNTER — Encounter (HOSPITAL_COMMUNITY): Payer: Self-pay

## 2017-12-18 ENCOUNTER — Encounter (HOSPITAL_COMMUNITY)
Admission: RE | Admit: 2017-12-18 | Discharge: 2017-12-18 | Disposition: A | Discharge status: Home / Self Care | Payer: Medicare Other | Source: Ambulatory Visit | Attending: Urology | Admitting: Urology

## 2017-12-18 DIAGNOSIS — Z01812 Encounter for preprocedural laboratory examination: Secondary | ICD-10-CM | POA: Diagnosis present

## 2017-12-18 DIAGNOSIS — D494 Neoplasm of unspecified behavior of bladder: Secondary | ICD-10-CM | POA: Diagnosis not present

## 2017-12-18 LAB — CBC
HEMATOCRIT: 42.1 % (ref 39.0–52.0)
HEMOGLOBIN: 13.7 g/dL (ref 13.0–17.0)
MCH: 30 pg (ref 26.0–34.0)
MCHC: 32.5 g/dL (ref 30.0–36.0)
MCV: 92.3 fL (ref 80.0–100.0)
Platelets: 119 10*3/uL — ABNORMAL LOW (ref 150–400)
RBC: 4.56 MIL/uL (ref 4.22–5.81)
RDW: 13 % (ref 11.5–15.5)
WBC: 7.3 10*3/uL (ref 4.0–10.5)
nRBC: 0 % (ref 0.0–0.2)

## 2017-12-18 LAB — BASIC METABOLIC PANEL
ANION GAP: 7 (ref 5–15)
BUN: 24 mg/dL — ABNORMAL HIGH (ref 8–23)
CO2: 26 mmol/L (ref 22–32)
Calcium: 9.6 mg/dL (ref 8.9–10.3)
Chloride: 108 mmol/L (ref 98–111)
Creatinine, Ser: 1.24 mg/dL (ref 0.61–1.24)
GFR calc Af Amer: >60 mL/min (ref >60)
GFR, EST NON AFRICAN AMERICAN: 52 mL/min — AB (ref >60)
GLUCOSE: 92 mg/dL (ref 70–99)
POTASSIUM: 4.8 mmol/L (ref 3.5–5.1)
Sodium: 141 mmol/L (ref 135–145)

## 2017-12-18 NOTE — Progress Notes (Addendum)
Pt has a history of Bifasicular Block. 04-30-17 EKG, and 10-13-16 ECHO in media reviewed with Dr. Christella Hartigan Anesthesiologist.  Per Dr. Christella Hartigan, okay to proceed with surgery.   12-18-17 CBC and BMP results routed to Dr. Alinda Money for review.

## 2017-12-21 NOTE — H&P (Signed)
CC/HPI: Urothelial carcinoma of the right renal collecting system with a solitary right kidney   [name] returns today for ongoing surveillance of his urothelial carcinoma of the right renal pelvis. He has noted some intermittent gross hematuria since his last visit. He follows up today after undergoing upper tract imaging with a hematuria protocol CT scan and is scheduled undergo surveillance cystoscopy as well. He continues to remain quite healthy with no new major medical problems. He has not had any new chest pain, shortness of breath, or palpitations that have developed.     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   2) Prostate cancer: He is s/p an open RRP at [facility] in 1994. He apparently had a biochemical recurrence soon after primary therapy and has been on systemic therapy with ADT since then under his urologist in [place].   3) Urothelial carcinoma of the right kidney: He was found to have a right renal collecting system tumor on imaging in 2018 in his solitary right kidney. This was confirmed on ureteroscopic evaluation to be high grade urothelial carcinoma.   Aug 2018: Right ureteroscopy with laser  ablation of right renal pelvic tumor - pathology indicated high-grade urothelial carcinoma  Sep 2018: Repeat right ureteroscopy with laser ablation of remaining right renal pelvic tumor - pathology confirmed high-grade urothelial carcinoma with a suspicious focus of lamina propria invasion  Nov 2018: Repeat right ureteroscopy - Minimal tumor remaining and completely laser ablated  May 2019: Cystoscopy with surveillance right ureteroscopy - Negative       NON-GU PMH: Bacteriuria - 11/28/2016 Coronary Artery Disease Sleep Apnea    FAMILY HISTORY: 1 Daughter - Runs in Family 2 sons - Runs in Family Cirrhosis - Father Death - Mother, Father   SOCIAL HISTORY: Marital Status: Married Preferred Language: English; Ethnicity: Not Hispanic Or Latino; Race: White Current Smoking Status: Patient has never smoked.   Tobacco Use Assessment Completed: Used Tobacco in last 30 days? Has never drank.  Drinks 1 caffeinated drink per day.    REVIEW OF SYSTEMS:    GU Review Male:   Patient denies frequent urination, hard to postpone urination, burning/ pain with urination, get up at night to urinate, leakage of urine, stream starts and stops, trouble starting your streams, and have to strain to urinate .  Gastrointestinal (Lower):   Patient denies diarrhea and constipation.  Gastrointestinal (Upper):   Patient denies nausea and vomiting.  Constitutional:   Patient denies fatigue, weight loss, night sweats, and fever.  Skin:   Patient denies skin rash/ lesion and itching.  Eyes:   Patient denies blurred vision and double vision.  Ears/ Nose/ Throat:   Patient denies sore throat and sinus problems.  Hematologic/Lymphatic:  Patient denies swollen glands and easy bruising.  Cardiovascular:   Patient denies leg swelling and chest pains.  Respiratory:   Patient denies cough and shortness of breath.  Endocrine:   Patient denies excessive thirst.  Musculoskeletal:   Patient denies back pain and joint pain.   Neurological:   Patient denies headaches and dizziness.  Psychologic:   Patient denies depression and anxiety.   VITAL SIGNS:     Height 71 in / 180.34 cm  BMI 24.1 kg/m   MULTI-SYSTEM PHYSICAL EXAMINATION:    Constitutional: Well-nourished. No physical deformities. Normally developed. Good grooming.  Neck: Neck symmetrical, not swollen. Normal tracheal position.  Respiratory: No labored breathing, no use of accessory muscles. Clear bilaterally.  Cardiovascular: Normal temperature, normal extremity pulses, no swelling, no varicosities. Regular rate and rhythm.  Lymphatic: No enlargement of neck, axillae, groin.  Skin: No paleness, no jaundice, no cyanosis. No lesion, no ulcer, no rash.  Neurologic / Psychiatric: Oriented to time, oriented to place, oriented to person. No depression, no anxiety, no agitation.  Gastrointestinal: No mass, no tenderness, no rigidity, non obese abdomen. No CVA tenderness. Incisions well healed.  Eyes: Normal conjunctivae. Normal eyelids.  Ears, Nose, Mouth, and Throat: Left ear no scars, no lesions, no masses. Right ear no scars, no lesions, no masses. Nose no scars, no lesions, no masses. Normal hearing. Normal lips.  Musculoskeletal: Normal gait and station of head and neck.        ASSESSMENT:      ICD-10 Details  1 GU:   Prostate Cancer - C61   2   Renal pelvis cancer, right - C65.1   3   Bladder tumor/neoplasm - D41.4    PLAN:     1. Urothelial carcinoma of the right renal pelvis and likely bladder: We discussed his cystoscopic findings today. I recommended that he proceed to the operating room in the near future for exam under anesthesia, cystoscopy, and transurethral resection of his bladder tumor. In addition, since we are going to proceed to the operating room under general anesthesia, I have recommended that we also proceed with right ureteroscopy for direct visualization of his prior area of tumor growth. However, he has been reassured that  his imaging findings do not suggest obvious recurrence in the right renal pelvis. He will require right ureteral stent placement afterwards considering he does have a solitary right kidney. Since he is going to have a ureteral stent, I will not utilize postoperative chemotherapy. We have reviewed the potential risks, complications, and the expected recovery process associated with this procedure. He gives informed consent to proceed.

## 2017-12-23 NOTE — Anesthesia Preprocedure Evaluation (Addendum)
Anesthesia Evaluation  Patient identified by MRN, date of birth, ID band Patient awake    Reviewed: Allergy & Precautions, NPO status , Patient's Chart, lab work & pertinent test results  Airway Mallampati: II  TM Distance: >3 FB Neck ROM: Full    Dental  (+) Dental Advisory Given, Teeth Intact   Pulmonary pneumonia, former smoker,    breath sounds clear to auscultation       Cardiovascular hypertension, + CABG   Rhythm:Regular Rate:Normal     Neuro/Psych    GI/Hepatic Neg liver ROS, GERD  ,  Endo/Other    Renal/GU Renal disease     Musculoskeletal  (+) Arthritis ,   Abdominal   Peds  Hematology   Anesthesia Other Findings   Reproductive/Obstetrics                            Anesthesia Physical  Anesthesia Plan  ASA: III  Anesthesia Plan: General   Post-op Pain Management:    Induction: Intravenous  PONV Risk Score and Plan: 4 or greater and Treatment may vary due to age or medical condition, Ondansetron and Dexamethasone  Airway Management Planned: LMA  Additional Equipment: None  Intra-op Plan:   Post-operative Plan: Extubation in OR  Informed Consent: I have reviewed the patients History and Physical, chart, labs and discussed the procedure including the risks, benefits and alternatives for the proposed anesthesia with the patient or authorized representative who has indicated his/her understanding and acceptance.   Dental advisory given  Plan Discussed with: CRNA and Anesthesiologist  Anesthesia Plan Comments:         Anesthesia Quick Evaluation

## 2017-12-24 ENCOUNTER — Ambulatory Visit (HOSPITAL_COMMUNITY): Payer: Medicare Other

## 2017-12-24 ENCOUNTER — Encounter (HOSPITAL_COMMUNITY)
Admission: RE | Disposition: A | Discharge status: Home / Self Care | Payer: Self-pay | Source: Ambulatory Visit | Attending: Urology

## 2017-12-24 ENCOUNTER — Ambulatory Visit (HOSPITAL_COMMUNITY): Payer: Medicare Other | Admitting: Anesthesiology

## 2017-12-24 ENCOUNTER — Encounter (HOSPITAL_COMMUNITY): Payer: Self-pay | Admitting: Anesthesiology

## 2017-12-24 ENCOUNTER — Ambulatory Visit (HOSPITAL_COMMUNITY)
Admission: RE | Admit: 2017-12-24 | Discharge: 2017-12-24 | Disposition: A | Discharge status: Home / Self Care | Payer: Medicare Other | Source: Ambulatory Visit | Attending: Urology | Admitting: Urology

## 2017-12-24 DIAGNOSIS — I2510 Atherosclerotic heart disease of native coronary artery without angina pectoris: Secondary | ICD-10-CM | POA: Insufficient documentation

## 2017-12-24 DIAGNOSIS — C61 Malignant neoplasm of prostate: Secondary | ICD-10-CM | POA: Diagnosis not present

## 2017-12-24 DIAGNOSIS — C679 Malignant neoplasm of bladder, unspecified: Secondary | ICD-10-CM | POA: Diagnosis present

## 2017-12-24 DIAGNOSIS — Z951 Presence of aortocoronary bypass graft: Secondary | ICD-10-CM | POA: Diagnosis not present

## 2017-12-24 DIAGNOSIS — C676 Malignant neoplasm of ureteric orifice: Secondary | ICD-10-CM | POA: Insufficient documentation

## 2017-12-24 DIAGNOSIS — M1990 Unspecified osteoarthritis, unspecified site: Secondary | ICD-10-CM | POA: Insufficient documentation

## 2017-12-24 DIAGNOSIS — Z87891 Personal history of nicotine dependence: Secondary | ICD-10-CM | POA: Insufficient documentation

## 2017-12-24 DIAGNOSIS — I10 Essential (primary) hypertension: Secondary | ICD-10-CM | POA: Diagnosis not present

## 2017-12-24 DIAGNOSIS — G4730 Sleep apnea, unspecified: Secondary | ICD-10-CM | POA: Diagnosis not present

## 2017-12-24 DIAGNOSIS — C651 Malignant neoplasm of right renal pelvis: Secondary | ICD-10-CM | POA: Diagnosis not present

## 2017-12-24 DIAGNOSIS — K219 Gastro-esophageal reflux disease without esophagitis: Secondary | ICD-10-CM | POA: Insufficient documentation

## 2017-12-24 HISTORY — PX: TRANSURETHRAL RESECTION OF BLADDER TUMOR: SHX2575

## 2017-12-24 SURGERY — TURBT (TRANSURETHRAL RESECTION OF BLADDER TUMOR)
Anesthesia: General | Laterality: Right

## 2017-12-24 MED ORDER — 0.9 % SODIUM CHLORIDE (POUR BTL) OPTIME
TOPICAL | Status: DC | PRN
  Administered 2017-12-24: 1000 mL

## 2017-12-24 MED ORDER — FENTANYL CITRATE (PF) 100 MCG/2ML IJ SOLN
INTRAMUSCULAR | Status: DC | PRN
  Administered 2017-12-24: 25 ug via INTRAVENOUS
  Administered 2017-12-24: 50 ug via INTRAVENOUS
  Administered 2017-12-24: 25 ug via INTRAVENOUS

## 2017-12-24 MED ORDER — DEXAMETHASONE SODIUM PHOSPHATE 10 MG/ML IJ SOLN
INTRAMUSCULAR | Status: AC
  Filled 2017-12-24: qty 4

## 2017-12-24 MED ORDER — PROPOFOL 10 MG/ML IV BOLUS
INTRAVENOUS | Status: DC | PRN
  Administered 2017-12-24: 130 mg via INTRAVENOUS

## 2017-12-24 MED ORDER — ROCURONIUM BROMIDE 10 MG/ML (PF) SYRINGE
PREFILLED_SYRINGE | INTRAVENOUS | Status: DC | PRN
  Administered 2017-12-24: 40 mg via INTRAVENOUS
  Administered 2017-12-24: 10 mg via INTRAVENOUS

## 2017-12-24 MED ORDER — FENTANYL CITRATE (PF) 100 MCG/2ML IJ SOLN
25.0000 ug | INTRAMUSCULAR | Status: DC | PRN
  Administered 2017-12-24: 25 ug via INTRAVENOUS

## 2017-12-24 MED ORDER — SUGAMMADEX SODIUM 200 MG/2ML IV SOLN
INTRAVENOUS | Status: AC
  Filled 2017-12-24: qty 2

## 2017-12-24 MED ORDER — EPHEDRINE 5 MG/ML INJ
INTRAVENOUS | Status: AC
  Filled 2017-12-24: qty 10

## 2017-12-24 MED ORDER — PHENAZOPYRIDINE HCL 200 MG PO TABS: 200.0000 mg | ORAL_TABLET | Freq: Three times a day (TID) | ORAL | 0 refills | Status: DC | PRN

## 2017-12-24 MED ORDER — PROMETHAZINE HCL 25 MG/ML IJ SOLN: 6.2500 mg | INTRAMUSCULAR | Status: DC | PRN

## 2017-12-24 MED ORDER — DEXAMETHASONE SODIUM PHOSPHATE 10 MG/ML IJ SOLN
INTRAMUSCULAR | Status: DC | PRN
  Administered 2017-12-24: 10 mg via INTRAVENOUS

## 2017-12-24 MED ORDER — LIDOCAINE 2% (20 MG/ML) 5 ML SYRINGE
INTRAMUSCULAR | Status: DC | PRN
  Administered 2017-12-24: 100 mg via INTRAVENOUS

## 2017-12-24 MED ORDER — MEPERIDINE HCL 50 MG/ML IJ SOLN: 6.2500 mg | INTRAMUSCULAR | Status: DC | PRN

## 2017-12-24 MED ORDER — LIDOCAINE HCL URETHRAL/MUCOSAL 2 % EX GEL
CUTANEOUS | Status: AC
  Filled 2017-12-24: qty 5

## 2017-12-24 MED ORDER — FENTANYL CITRATE (PF) 100 MCG/2ML IJ SOLN
INTRAMUSCULAR | Status: AC
  Filled 2017-12-24: qty 2

## 2017-12-24 MED ORDER — LIDOCAINE 2% (20 MG/ML) 5 ML SYRINGE
INTRAMUSCULAR | Status: AC
  Filled 2017-12-24: qty 20

## 2017-12-24 MED ORDER — ROCURONIUM BROMIDE 10 MG/ML (PF) SYRINGE
PREFILLED_SYRINGE | INTRAVENOUS | Status: AC
  Filled 2017-12-24: qty 10

## 2017-12-24 MED ORDER — LACTATED RINGERS IV SOLN
INTRAVENOUS | Status: DC
  Administered 2017-12-24: 07:00:00 via INTRAVENOUS

## 2017-12-24 MED ORDER — SODIUM CHLORIDE 0.9 % IR SOLN
Status: DC | PRN
  Administered 2017-12-24: 6000 mL via INTRAVESICAL

## 2017-12-24 MED ORDER — SUGAMMADEX SODIUM 200 MG/2ML IV SOLN
INTRAVENOUS | Status: DC | PRN
  Administered 2017-12-24: 200 mg via INTRAVENOUS

## 2017-12-24 MED ORDER — PROPOFOL 10 MG/ML IV BOLUS
INTRAVENOUS | Status: AC
  Filled 2017-12-24: qty 40

## 2017-12-24 MED ORDER — CEFAZOLIN SODIUM-DEXTROSE 2-4 GM/100ML-% IV SOLN
2.0000 g | Freq: Once | INTRAVENOUS | Status: AC
  Administered 2017-12-24: 2 g via INTRAVENOUS
  Filled 2017-12-24: qty 100

## 2017-12-24 MED ORDER — LIDOCAINE HCL URETHRAL/MUCOSAL 2 % EX GEL
CUTANEOUS | Status: DC | PRN
  Administered 2017-12-24: 1 via URETHRAL

## 2017-12-24 MED ORDER — ONDANSETRON HCL 4 MG/2ML IJ SOLN
INTRAMUSCULAR | Status: DC | PRN
  Administered 2017-12-24: 4 mg via INTRAVENOUS

## 2017-12-24 MED ORDER — EPHEDRINE SULFATE-NACL 50-0.9 MG/10ML-% IV SOSY
PREFILLED_SYRINGE | INTRAVENOUS | Status: DC | PRN
  Administered 2017-12-24: 10 mg via INTRAVENOUS

## 2017-12-24 MED ORDER — ONDANSETRON HCL 4 MG/2ML IJ SOLN
INTRAMUSCULAR | Status: AC
  Filled 2017-12-24: qty 8

## 2017-12-24 MED ORDER — SUGAMMADEX SODIUM 500 MG/5ML IV SOLN
INTRAVENOUS | Status: AC
  Filled 2017-12-24: qty 5

## 2017-12-24 SURGICAL SUPPLY — 17 items
BAG URINE DRAINAGE (UROLOGICAL SUPPLIES) IMPLANT
BAG URO CATCHER STRL LF (MISCELLANEOUS) ×3 IMPLANT
COVER WAND RF STERILE (DRAPES) IMPLANT
ELECT REM PT RETURN 15FT ADLT (MISCELLANEOUS) IMPLANT
FIBER LASER TRAC TIP (UROLOGICAL SUPPLIES) ×2 IMPLANT
GLOVE BIOGEL M STRL SZ7.5 (GLOVE) ×3 IMPLANT
GOWN STRL REUS W/TWL LRG LVL3 (GOWN DISPOSABLE) ×3 IMPLANT
GUIDEWIRE STR DUAL SENSOR (WIRE) ×2 IMPLANT
LOOP CUT BIPOLAR 24F LRG (ELECTROSURGICAL) ×3 IMPLANT
MANIFOLD NEPTUNE II (INSTRUMENTS) ×3 IMPLANT
PACK CYSTO (CUSTOM PROCEDURE TRAY) ×3 IMPLANT
SET ASPIRATION TUBING (TUBING) IMPLANT
STENT CONTOUR 6FRX26X.038 (STENTS) ×2 IMPLANT
SYRINGE IRR TOOMEY STRL 70CC (SYRINGE) ×2 IMPLANT
TUBING CONNECTING 10 (TUBING) ×2 IMPLANT
TUBING CONNECTING 10' (TUBING) ×1
TUBING UROLOGY SET (TUBING) ×3 IMPLANT

## 2017-12-24 NOTE — Transfer of Care (Signed)
Immediate Anesthesia Transfer of Care Note  Patient: Dannel Rafter.  Procedure(s) Performed: Procedure(s) with comments: TRANSURETHRAL RESECTION OF BLADDER TUMOR (TURBT) WITH CYSTOSCOPY, URETEROSCOPY, LASER ABLATION, URETERAL STENT PLACEMENT (Right) - GENERAL ANESTHESIA WITH PARALYSIS  Patient Location: PACU  Anesthesia Type:General  Level of Consciousness:  sedated, patient cooperative and responds to stimulation  Airway & Oxygen Therapy:Patient Spontanous Breathing and Patient connected to face mask oxgen  Post-op Assessment:  Report given to PACU RN and Post -op Vital signs reviewed and stable  Post vital signs:  Reviewed and stable  Last Vitals:  Vitals:   12/24/17 0633 12/24/17 0902  BP: (!) 161/78 (!) (P) 149/83  Pulse: (!) 56 75  Resp: 16 15  Temp: (!) 36.4 C (!) 36.4 C  SpO2: 98% (P) 921%    Complications: No apparent anesthesia complications

## 2017-12-24 NOTE — Op Note (Signed)
Preoperative diagnosis:  1.  Urothelial carcinoma of the right renal pelvis 2.  Bladder tumor  Postoperative diagnosis: 1.  Urothelial carcinoma the right renal pelvis 2.  Bladder tumor  Procedures: 1.  Cystoscopy 2.  Examination under anesthesia 3.  Right ureteroscopy with laser ablation of right renal pelvic tumor 4.  Right ureteral stent placement (6 x 26 -no string) 5.  Transurethral resection of bladder tumor (3 cm)  Surgeon: Pryor Curia MD  Anesthesia: General  Estimated blood loss: Minimal  Specimens: Bladder tumor  Disposition of specimen: Pathology  Intraoperative findings: The patient was noted to have a small less than 1 cm papillary tumor in the lower pole calyx on the right.  In addition, there was a conglomeration of papillary appearing bladder tumor extending from the left bladder wall toward the midline just beyond the left ureteral orifice.  This area measured a proximally 3 cm.  Indication: Wesley Snyder is an 84 year old gentleman with a right solitary kidney and urothelial carcinoma of the right renal collecting system.  He has previously undergone multiple laser tumor ablations.  He recently presented for surveillance and underwent upper tract imaging that was unremarkable.  However, on cystoscopy, he was noted to have a 3 cm papillary bladder tumor on the left side of the bladder wall.  He has been having intermittent gross hematuria.  He was recommended to undergo the above procedures.  We reviewed the potential risks, complications, and the expected recovery process.  Informed consent was obtained.  Description of procedure: The patient was taken to the operating room and a general anesthetic was administered.  He was given preoperative antibiotics, placed in the dorsolithotomy position, and prepped and draped in the usual sterile fashion.  Paralysis was administered.  Cystourethroscopy was then performed.  This revealed a normal urethra.  Inspection of the  bladder with both a 30 and 70 degree lens revealed the ureteral orifice ease to be in their expected anatomic locations bilaterally.  On the left side of the bladder extending from the left lateral sidewall and extending toward the midline just beyond the left ureteral orifice was an area of papillary bladder tumor consistent with urothelial carcinoma that measured approximately 3 cm in diameter.  There were also isolated smaller bladder tumors noted toward the midline of the bladder and slightly toward the right of midline of the bladder.  Attention then turned to the right ureteral orifice.  This was cannulated with a 0.38 sensor guidewire.  This was advanced up in the right renal collecting system under fluoroscopic guidance.  The digital flexible ureteroscope was then advanced over the wire and extended up over the wire into the right renal collecting system without resistance.  The entire right renal collecting system was then carefully examined.  The upper pole collecting system was clear of any tumors.  However, looking at the lower pole calyx, there was noted to be an area of papillary bladder tumor measuring approximately 0.5 cm.  The remainder of the collecting system was without any abnormalities.  Using a 200 m fiber, holmium laser ablation was then performed of the previously noted bladder tumors.  Once all tumor was grossly ablated.  The laser fiber was removed.  The digital ureteroscope was carefully withdrawn from the collecting system with care to carefully examine the entire ureter.  No abnormalities were noted.  The safety wire was left in place and this was backloaded on the cystoscope.  A 6 x 26 double-J ureteral stent was then  advanced over the wire using Seldinger technique.  This was appropriately positioned under fluoroscopic and cystoscopic guidance.  The wire was removed with a good curl noted in the renal pelvis as well as within the bladder.  No string was left on the  stent.  Attention then returned to the bladder.  The cystoscope was removed and replaced with the 28 French resectoscope.  The previously noted bladder tumor was then removed with the cutting loop resectoscope.  Hemostasis was achieved with cautery.  Care was taken to remove all visible tumor.  The previously noted other small areas of tumor were carefully cauterized.  All tumor specimen was removed from the bladder.  The bladder was then emptied and reinspected and hemostasis was ensured.  The patient tolerated the procedure well without complications.  He was able to be extubated and transferred to recovery unit in satisfactory condition.

## 2017-12-24 NOTE — Discharge Instructions (Signed)

## 2017-12-24 NOTE — Anesthesia Postprocedure Evaluation (Signed)
Anesthesia Post Note  Patient: Wesley Snyder.  Procedure(s) Performed: TRANSURETHRAL RESECTION OF BLADDER TUMOR (TURBT) WITH CYSTOSCOPY, URETEROSCOPY, LASER ABLATION, URETERAL STENT PLACEMENT (Right )     Patient location during evaluation: PACU Anesthesia Type: General Level of consciousness: sedated and patient cooperative Pain management: pain level controlled Vital Signs Assessment: post-procedure vital signs reviewed and stable Respiratory status: spontaneous breathing Cardiovascular status: stable Anesthetic complications: no    Last Vitals:  Vitals:   12/24/17 1005 12/24/17 1050  BP: (!) 161/90 (!) 176/96  Pulse: 63 66  Resp: 18 18  Temp: 36.4 C 36.4 C  SpO2: 97% 98%    Last Pain:  Vitals:   12/24/17 1005  TempSrc:   PainSc: 0-No pain                 Nolon Nations

## 2017-12-24 NOTE — Anesthesia Procedure Notes (Signed)
Procedure Name: Intubation Date/Time: 12/24/2017 8:01 AM Performed by: Lavina Hamman, CRNA Pre-anesthesia Checklist: Patient identified, Emergency Drugs available, Suction available, Patient being monitored and Timeout performed Patient Re-evaluated:Patient Re-evaluated prior to induction Oxygen Delivery Method: Circle system utilized Preoxygenation: Pre-oxygenation with 100% oxygen Induction Type: IV induction Ventilation: Mask ventilation without difficulty Laryngoscope Size: Mac and 4 Grade View: Grade I Tube type: Oral Tube size: 7.5 mm Number of attempts: 1 Airway Equipment and Method: Stylet Placement Confirmation: ETT inserted through vocal cords under direct vision,  positive ETCO2,  CO2 detector and breath sounds checked- equal and bilateral Secured at: 22 cm Tube secured with: Tape Dental Injury: Teeth and Oropharynx as per pre-operative assessment

## 2017-12-25 ENCOUNTER — Encounter (HOSPITAL_COMMUNITY): Payer: Self-pay | Admitting: Urology

## 2018-02-01 ENCOUNTER — Other Ambulatory Visit: Payer: Self-pay | Admitting: Urology

## 2018-02-06 ENCOUNTER — Encounter (HOSPITAL_COMMUNITY): Payer: Self-pay

## 2018-02-06 ENCOUNTER — Other Ambulatory Visit: Payer: Self-pay

## 2018-02-06 ENCOUNTER — Encounter (HOSPITAL_COMMUNITY)
Admission: RE | Admit: 2018-02-06 | Discharge: 2018-02-06 | Disposition: A | Discharge status: Home / Self Care | Payer: Medicare Other | Source: Ambulatory Visit | Attending: Urology | Admitting: Urology

## 2018-02-06 DIAGNOSIS — Z01818 Encounter for other preprocedural examination: Secondary | ICD-10-CM | POA: Diagnosis present

## 2018-02-06 LAB — CBC
HCT: 43.9 % (ref 39.0–52.0)
Hemoglobin: 14.3 g/dL (ref 13.0–17.0)
MCH: 30 pg (ref 26.0–34.0)
MCHC: 32.6 g/dL (ref 30.0–36.0)
MCV: 92.2 fL (ref 80.0–100.0)
Platelets: 138 K/uL — ABNORMAL LOW (ref 150–400)
RBC: 4.76 MIL/uL (ref 4.22–5.81)
RDW: 13.1 % (ref 11.5–15.5)
WBC: 8.5 K/uL (ref 4.0–10.5)
nRBC: 0 % (ref 0.0–0.2)

## 2018-02-06 LAB — BASIC METABOLIC PANEL
Anion gap: 8 (ref 5–15)
BUN: 24 mg/dL — ABNORMAL HIGH (ref 8–23)
CALCIUM: 9.6 mg/dL (ref 8.9–10.3)
CO2: 25 mmol/L (ref 22–32)
Chloride: 109 mmol/L (ref 98–111)
Creatinine, Ser: 1.28 mg/dL — ABNORMAL HIGH (ref 0.61–1.24)
GFR calc Af Amer: 60 mL/min — ABNORMAL LOW (ref >60)
GFR calc non Af Amer: 51 mL/min — ABNORMAL LOW (ref >60)
GLUCOSE: 91 mg/dL (ref 70–99)
Potassium: 4.5 mmol/L (ref 3.5–5.1)
SODIUM: 142 mmol/L (ref 135–145)

## 2018-02-06 NOTE — Patient Instructions (Signed)
Warren Danes.  02/06/2018   Your procedure is scheduled on: 02-11-18  Report to [facility] Main  Entrance  Report to admitting at      200 PM    Call this number if you have problems the morning of surgery [phone]    Remember: Do not eat food  :After Midnight. YOU MAY HAVE CLEAR LIQUIDS UNTIL 1000 AM THEN NOTHING BY MOUTH   BRUSH YOUR TEETH MORNING OF SURGERY AND RINSE YOUR MOUTH OUT, NO CHEWING GUM CANDY OR MINTS.     Take these medicines the morning of surgery with A SIP OF WATER: NONE                                You may not have any metal on your body including hair pins and              piercings  Do not wear jewelry,  lotions, powders or perfumes, deodorant                    Men may shave face and neck.   Do not bring valuables to the hospital. [place].  Contacts, dentures or bridgework may not be worn into surgery.     Patients discharged the day of surgery will not be allowed to drive home.  Name and phone number of your driver:  Special Instructions: N/A              Please read over the following fact sheets you were given: _____________________________________________________________________          CLEAR LIQUID DIET   Foods Allowed                                                                     Foods Excluded  Coffee and tea, regular and decaf                             liquids that you cannot  Plain Jell-O in any flavor                                             see through such as: Fruit ices (not with fruit pulp)                                     milk, soups, orange juice  Iced Popsicles                                    All solid food Carbonated beverages, regular and diet  Cranberry, grape and apple juices Sports drinks like Gatorade Lightly seasoned clear broth or consume(fat free) Sugar, honey syrup  Sample  Menu Breakfast                                Lunch                                     Supper Cranberry juice                    Beef broth                            Chicken broth Jell-O                                     Grape juice                           Apple juice Coffee or tea                        Jell-O                                      Popsicle                                                Coffee or tea                        Coffee or tea  _____________________________________________________________________  [facility] - Preparing for Surgery Before surgery, you can play an important role.  Because skin is not sterile, your skin needs to be as free of germs as possible.  You can reduce the number of germs on your skin by washing with CHG (chlorahexidine gluconate) soap before surgery.  CHG is an antiseptic cleaner which kills germs and bonds with the skin to continue killing germs even after washing. Please DO NOT use if you have an allergy to CHG or antibacterial soaps.  If your skin becomes reddened/irritated stop using the CHG and inform your nurse when you arrive at Short Stay. Do not shave (including legs and underarms) for at least 48 hours prior to the first CHG shower.  You may shave your face/neck. Please follow these instructions carefully:  1.  Shower with CHG Soap the night before surgery and the  morning of Surgery.  2.  If you choose to wash your hair, wash your hair first as usual with your  normal  shampoo.  3.  After you shampoo, rinse your hair and body thoroughly to remove the  shampoo.                           4.  Use CHG as you would any other liquid soap.  You can apply chg directly  to the skin and wash  Gently with a scrungie or clean washcloth.  5.  Apply the CHG Soap to your body ONLY FROM THE NECK DOWN.   Do not use on face/ open                           Wound or open sores. Avoid contact with eyes, ears mouth and genitals  (private parts).                       Wash face,  Genitals (private parts) with your normal soap.             6.  Wash thoroughly, paying special attention to the area where your surgery  will be performed.  7.  Thoroughly rinse your body with warm water from the neck down.  8.  DO NOT shower/wash with your normal soap after using and rinsing off  the CHG Soap.                9.  Pat yourself dry with a clean towel.            10.  Wear clean pajamas.            11.  Place clean sheets on your bed the night of your first shower and do not  sleep with pets. Day of Surgery : Do not apply any lotions/deodorants the morning of surgery.  Please wear clean clothes to the hospital/surgery center.  FAILURE TO FOLLOW THESE INSTRUCTIONS MAY RESULT IN THE CANCELLATION OF YOUR SURGERY PATIENT SIGNATURE_________________________________  NURSE SIGNATURE__________________________________  ________________________________________________________________________

## 2018-02-06 NOTE — Progress Notes (Signed)
Cbc and cmp done 02-06-18 routed to Dr. Alinda Money via epic

## 2018-02-08 NOTE — H&P (Signed)
Office Visit Report     01/31/2018   --------------------------------------------------------------------------------   Wesley Snyder  MRN: [id number]  PRIMARY CARE:  Wesley Snyder  DOB: 12/25/1932, 84 year old Male  REFERRING:  Wesley Gell. Hurt, MD  SSN: -[id number]  PROVIDER:  Raynelle Snyder, M.D.    LOCATION:  [facility] [phone]   --------------------------------------------------------------------------------   CC/HPI: 1. Urothelial carcinoma of the right renal collecting system  2. Urothelial carcinoma of the bladder  3. Prostate cancer   [name] returns today after he had presented earlier this week with plans to begin intravesical BCG for high-grade, T1 urothelial carcinoma the bladder. We delayed starting his BCG until after his ureteral stent was removed from his recent ureteroscopic procedure. Unfortunately, he has had persistent gross hematuria since his stent was removed including earlier this week when he was scheduled to begin BCG. He denies any dysuria, fever, or other symptoms. He presents today for cystoscopy and further evaluation. His recent urinalysis demonstrated no evidence of infection.     ALLERGIES: None    MEDICATIONS: Aspirin  Lisinopril     GU PSH: Catheterization For Collection Of Specimen, Single Patient, All Places Of Service - 01/28/2018 Cysto Remove Stent FB Sim - 01/08/2018, 01/23/2017 Cysto Uretero Biopsy Fulgura, Right - 12/24/2017, Right - 11/16/2016 Cysto Uretero W/excise Tumor, Right - 01/11/2017, Right - 10/23/2016 Cystoscopy - 11/23/2017 Cystoscopy Insert Stent, Right - 12/24/2017, Right - 07/05/2017, Right - 01/11/2017, Right - 11/16/2016, Right - 10/23/2016 Cystoscopy TURBT 2-5 cm, Right - 12/24/2017 Cystoscopy Ureteroscopy, Right - 07/05/2017 Locm 300-399Mg /Ml Iodine,1Ml - 11/19/2017, 04/27/2017 Radical Prostatectomy    NON-GU PSH: Cataract surgery, Bilateral - 11/08/2017 Coronary Artery Bypass Grafting    GU PMH:  Bladder Cancer overlapping sites - 01/08/2018 Gross hematuria - 12/10/2017, - 11/08/2017, - 09/27/2016 Renal pelvis cancer, right - 11/07/2016 History of kidney cancer - 09/27/2016 Prostate Cancer - 09/27/2016 Right renal neoplasm - 09/27/2016      PMH Notes:   1) Renal cell carcinoma: He is s/p an open left radical nephrectomy at [facility] in 1994.   2) Prostate cancer: He is s/p an open RRP at [facility] in 1994. He apparently had a biochemical recurrence soon after primary therapy and has been on systemic therapy with ADT since then under his urologist in [place].   3) Urothelial carcinoma of the right kidney: He was found to have a right renal collecting system tumor on imaging in 2018 in his solitary right kidney. This was confirmed on ureteroscopic evaluation to be high grade urothelial carcinoma.   Aug 2018: Right ureteroscopy with laser ablation of right renal pelvic tumor - pathology indicated high-grade urothelial carcinoma  Sep 2018: Repeat right ureteroscopy with laser ablation of remaining right renal pelvic tumor - pathology confirmed high-grade urothelial carcinoma with a suspicious focus of lamina propria invasion  Nov 2018: Repeat right ureteroscopy - Minimal tumor remaining and completely laser ablated  May 2019: Cystoscopy with surveillance right ureteroscopy - Negative  Oct 2019: He develop recurrent hematuria, right ureteroscopy with laser ablation of small tumor in lower pole and TURBT of bladder tumor - High grade T1a urothelial carcinoma  Nov 2019:      NON-GU PMH: Bacteriuria - 11/28/2016 Coronary Artery Disease Sleep Apnea    FAMILY HISTORY: 1 Daughter - Runs in Family 2 sons - Runs in Family Cirrhosis - Father Death - Mother, Father   SOCIAL HISTORY: Marital Status: Married Preferred Language: English; Ethnicity: Not Hispanic  Or Latino; Race: White Current Smoking Status: Patient has never smoked.   Tobacco Use Assessment Completed: Used Tobacco in  last 30 days? Has never drank.  Drinks 1 caffeinated drink per day.    REVIEW OF SYSTEMS:    GU Review Male:   Patient reports frequent urination. Patient denies hard to postpone urination, burning/ pain with urination, get up at night to urinate, leakage of urine, stream starts and stops, trouble starting your streams, and have to strain to urinate .  Gastrointestinal (Lower):   Patient denies diarrhea and constipation.  Gastrointestinal (Upper):   Patient denies nausea and vomiting.  Constitutional:   Patient denies weight loss, fever, night sweats, and fatigue.  Skin:   Patient denies skin rash/ lesion and itching.  Eyes:   Patient denies blurred vision and double vision.  Ears/ Nose/ Throat:   Patient denies sore throat and sinus problems.  Hematologic/Lymphatic:   Patient denies swollen glands and easy bruising.  Cardiovascular:   Patient denies leg swelling and chest pains.  Respiratory:   Patient denies cough and shortness of breath.  Endocrine:   Patient denies excessive thirst.  Musculoskeletal:   Patient denies back pain and joint pain.  Neurological:   Patient denies headaches and dizziness.  Psychologic:   Patient denies depression and anxiety.   VITAL SIGNS:      01/31/2018 02:00 PM  Weight 175 lb / 79.38 kg  Height 71 in / 180.34 cm  BP 173/84 mmHg  Pulse 72 /min  BMI 24.4 kg/m   GU PHYSICAL EXAMINATION:    Urethral Meatus: Normal size. No lesion, no wart, no discharge, no polyp. Normal location.   MULTI-SYSTEM PHYSICAL EXAMINATION:    Constitutional: Well-nourished. No physical deformities. Normally developed. Good grooming.  Respiratory: No labored breathing, no use of accessory muscles. Clear bilaterally.  Cardiovascular: Normal temperature, normal extremity pulses, no swelling, no varicosities. Regular rate and rhythm.     PAST DATA REVIEWED:  Source Of History:  Patient   09/27/16  PSA  Total PSA <0.015 ng/dL    PROCEDURES:          Cytso Fulgeration  - 49449 Indication: Hematuria  After consent was obtained, the patient was laid supine and prepped and draped in the usual sterile fashion. A Bovie pad was applied. Flexible cystourethroscopy was performed with a 16 French flexible cystoscope. This revealed a normal urethra. The prostatic urethra was surgically absent. On inspection of the bladder, multiple abnormal findings were identified. At the bladder neck near the trigone was an area of edema and active bleeding. No definite tumor was noted in this area. Beyond this more posteriorly in the midline and also toward the right lateral bladder, 2 small, new papillary bladder tumors were identified. The tumor toward the midline was also noted to be actively bleeding. Using the Bugbee electrode, I performed fulguration of these bleeding sites and this resulted in hemostasis. The patient tolerated this well and without complication.   ASSESSMENT:      ICD-10 Details  1 GU:   Bladder Cancer overlapping sites - C67.8   2   Gross hematuria - R31.0    PLAN:           Schedule Return Visit/Planned Activity: Other See Visit Notes             Note: Will call to schedule surgery          Document Letter(s):  Created for Patient: Clinical Summary  Notes:   1. Hematuria/urothelial carcinoma the bladder: We discussed the findings today. Hopefully the fulguration will stop his active bleeding and prevent him from developing clots or clot retention. I recommended that he proceed with cystoscopy and transurethral resection of his new bladder tumors prior to proceeding with intravesical therapy. This will be scheduled for the near future. We have reviewed this procedure in detail today including the potential risks, complications, and expected recovery process. His planned BCG treatments for the next few weeks have been canceled.   2. Urothelial carcinoma of the right renal pelvis: We will consider further imaging or endoscopy in the future for  surveillance.   3. Prostate cancer: His next PSA will be due at his next planned office visit.   Cc: Dr. Alain Marion         Next Appointment:      Next Appointment: 05/31/2018 10:45 AM    Appointment Type: Male Cysto    Location: [facility] [phone]    Provider: Raynelle Snyder, M.D.    Reason for Visit: 3 mo ck with cysto post BCG treatments      * Signed by Wesley Snyder, M.D. on 01/31/18 at 4:10 PM (EST)*

## 2018-02-11 ENCOUNTER — Encounter (HOSPITAL_COMMUNITY): Payer: Self-pay | Admitting: *Deleted

## 2018-02-11 ENCOUNTER — Ambulatory Visit (HOSPITAL_COMMUNITY)
Admission: RE | Admit: 2018-02-11 | Discharge: 2018-02-11 | Disposition: A | Discharge status: Home / Self Care | Payer: Medicare Other | Attending: Urology | Admitting: Urology

## 2018-02-11 ENCOUNTER — Ambulatory Visit (HOSPITAL_COMMUNITY): Payer: Medicare Other | Admitting: Anesthesiology

## 2018-02-11 ENCOUNTER — Encounter (HOSPITAL_COMMUNITY)
Admission: RE | Disposition: A | Discharge status: Home / Self Care | Payer: Self-pay | Source: Home / Self Care | Attending: Urology

## 2018-02-11 DIAGNOSIS — Z7982 Long term (current) use of aspirin: Secondary | ICD-10-CM | POA: Diagnosis not present

## 2018-02-11 DIAGNOSIS — C678 Malignant neoplasm of overlapping sites of bladder: Secondary | ICD-10-CM | POA: Diagnosis not present

## 2018-02-11 DIAGNOSIS — Z79899 Other long term (current) drug therapy: Secondary | ICD-10-CM | POA: Diagnosis not present

## 2018-02-11 DIAGNOSIS — Z87891 Personal history of nicotine dependence: Secondary | ICD-10-CM | POA: Diagnosis not present

## 2018-02-11 DIAGNOSIS — R310 Gross hematuria: Secondary | ICD-10-CM | POA: Diagnosis not present

## 2018-02-11 DIAGNOSIS — Z8553 Personal history of malignant neoplasm of renal pelvis: Secondary | ICD-10-CM | POA: Diagnosis not present

## 2018-02-11 DIAGNOSIS — I2510 Atherosclerotic heart disease of native coronary artery without angina pectoris: Secondary | ICD-10-CM | POA: Diagnosis not present

## 2018-02-11 HISTORY — PX: TRANSURETHRAL RESECTION OF BLADDER TUMOR: SHX2575

## 2018-02-11 SURGERY — TURBT (TRANSURETHRAL RESECTION OF BLADDER TUMOR)
Anesthesia: General

## 2018-02-11 MED ORDER — PROPOFOL 10 MG/ML IV BOLUS
INTRAVENOUS | Status: AC
  Filled 2018-02-11: qty 20

## 2018-02-11 MED ORDER — PROPOFOL 10 MG/ML IV BOLUS
INTRAVENOUS | Status: DC | PRN
  Administered 2018-02-11: 100 mg via INTRAVENOUS

## 2018-02-11 MED ORDER — ONDANSETRON HCL 4 MG/2ML IJ SOLN
INTRAMUSCULAR | Status: DC | PRN
  Administered 2018-02-11: 4 mg via INTRAVENOUS

## 2018-02-11 MED ORDER — FENTANYL CITRATE (PF) 100 MCG/2ML IJ SOLN
INTRAMUSCULAR | Status: AC
  Filled 2018-02-11: qty 2

## 2018-02-11 MED ORDER — OXYCODONE HCL 5 MG PO TABS: 5.0000 mg | ORAL_TABLET | Freq: Once | ORAL | Status: DC | PRN

## 2018-02-11 MED ORDER — FENTANYL CITRATE (PF) 100 MCG/2ML IJ SOLN
25.0000 ug | INTRAMUSCULAR | Status: DC | PRN
  Administered 2018-02-11 (×2): 50 ug via INTRAVENOUS

## 2018-02-11 MED ORDER — OXYCODONE HCL 5 MG/5ML PO SOLN
5.0000 mg | Freq: Once | ORAL | Status: DC | PRN
  Filled 2018-02-11: qty 5

## 2018-02-11 MED ORDER — ROCURONIUM BROMIDE 10 MG/ML (PF) SYRINGE
PREFILLED_SYRINGE | INTRAVENOUS | Status: AC
  Filled 2018-02-11: qty 10

## 2018-02-11 MED ORDER — SUGAMMADEX SODIUM 200 MG/2ML IV SOLN
INTRAVENOUS | Status: DC | PRN
  Administered 2018-02-11: 350 mg via INTRAVENOUS

## 2018-02-11 MED ORDER — SUGAMMADEX SODIUM 200 MG/2ML IV SOLN
INTRAVENOUS | Status: AC
  Filled 2018-02-11: qty 2

## 2018-02-11 MED ORDER — ROCURONIUM BROMIDE 10 MG/ML (PF) SYRINGE
PREFILLED_SYRINGE | INTRAVENOUS | Status: DC | PRN
  Administered 2018-02-11: 40 mg via INTRAVENOUS

## 2018-02-11 MED ORDER — ONDANSETRON HCL 4 MG/2ML IJ SOLN
INTRAMUSCULAR | Status: AC
  Filled 2018-02-11: qty 2

## 2018-02-11 MED ORDER — PROMETHAZINE HCL 25 MG/ML IJ SOLN: 6.2500 mg | INTRAMUSCULAR | Status: DC | PRN

## 2018-02-11 MED ORDER — SODIUM CHLORIDE 0.9 % IR SOLN
Status: DC | PRN
  Administered 2018-02-11: 9000 mL

## 2018-02-11 MED ORDER — FENTANYL CITRATE (PF) 100 MCG/2ML IJ SOLN
INTRAMUSCULAR | Status: DC | PRN
  Administered 2018-02-11 (×2): 50 ug via INTRAVENOUS

## 2018-02-11 MED ORDER — PHENAZOPYRIDINE HCL 100 MG PO TABS: 100.0000 mg | ORAL_TABLET | Freq: Three times a day (TID) | ORAL | 0 refills | Status: DC | PRN

## 2018-02-11 MED ORDER — DEXAMETHASONE SODIUM PHOSPHATE 10 MG/ML IJ SOLN
INTRAMUSCULAR | Status: AC
  Filled 2018-02-11: qty 1

## 2018-02-11 MED ORDER — LIDOCAINE 2% (20 MG/ML) 5 ML SYRINGE
INTRAMUSCULAR | Status: DC | PRN
  Administered 2018-02-11: 60 mg via INTRAVENOUS

## 2018-02-11 MED ORDER — DEXAMETHASONE SODIUM PHOSPHATE 10 MG/ML IJ SOLN
INTRAMUSCULAR | Status: DC | PRN
  Administered 2018-02-11: 10 mg via INTRAVENOUS

## 2018-02-11 MED ORDER — LIDOCAINE 2% (20 MG/ML) 5 ML SYRINGE
INTRAMUSCULAR | Status: AC
  Filled 2018-02-11: qty 5

## 2018-02-11 MED ORDER — CEFAZOLIN SODIUM-DEXTROSE 2-4 GM/100ML-% IV SOLN
2.0000 g | Freq: Once | INTRAVENOUS | Status: AC
  Administered 2018-02-11: 2 g via INTRAVENOUS
  Filled 2018-02-11: qty 100

## 2018-02-11 MED ORDER — LACTATED RINGERS IV SOLN
INTRAVENOUS | Status: DC
  Administered 2018-02-11: 15:00:00 via INTRAVENOUS

## 2018-02-11 SURGICAL SUPPLY — 14 items
BAG URINE DRAINAGE (UROLOGICAL SUPPLIES) IMPLANT
BAG URO CATCHER STRL LF (MISCELLANEOUS) ×3 IMPLANT
COVER WAND RF STERILE (DRAPES) IMPLANT
ELECT REM PT RETURN 15FT ADLT (MISCELLANEOUS) ×3 IMPLANT
GLOVE BIOGEL M STRL SZ7.5 (GLOVE) ×3 IMPLANT
GOWN STRL REUS W/TWL LRG LVL3 (GOWN DISPOSABLE) ×3 IMPLANT
LOOP CUT BIPOLAR 24F LRG (ELECTROSURGICAL) ×2 IMPLANT
MANIFOLD NEPTUNE II (INSTRUMENTS) ×3 IMPLANT
PACK CYSTO (CUSTOM PROCEDURE TRAY) ×3 IMPLANT
SET ASPIRATION TUBING (TUBING) IMPLANT
SYRINGE IRR TOOMEY STRL 70CC (SYRINGE) IMPLANT
TUBING CONNECTING 10 (TUBING) ×2 IMPLANT
TUBING CONNECTING 10' (TUBING) ×1
TUBING UROLOGY SET (TUBING) ×3 IMPLANT

## 2018-02-11 NOTE — Discharge Instructions (Addendum)
1. You may see some blood in the urine and may have some burning with urination for 48-72 hours. You also may notice that you have to urinate more frequently or urgently after your procedure which is normal.  °2. You should call should you develop an inability urinate, fever > 101, persistent nausea and vomiting that prevents you from eating or drinking to stay hydrated.  °

## 2018-02-11 NOTE — Op Note (Signed)
Preoperative diagnosis: 1. Bladder cancer (2 cm)  Postoperative diagnosis:  1. Bladder tumor (2 cm)  Procedure:  1. Cystoscopy 2. Transurethral resection of bladder tumor (largest 2 cm)  Surgeon: Roxy Horseman, Brooke Bonito. M.D.  Anesthesia: General  Complications: None  Intraoperative findings:  1. Bladder tumor: There were papillary bladder tumors located at the right lateral wall (1 cm), posterior bladder 1.5 cm), and a flat, sessile lesion at the trigone (2 cm).  EBL: Minimal  Specimens: 1. Right lateral bladder tumor 2. Posterior bladder tumor 3. Bladder trigone tumor  Disposition of specimens: Pathology  Indication: Wesley Snyder. is a patient with a history of urothelial carcinoma of the right renal pelvis and bladder.  He was recently noted to have an upgrade high grade, T1 tumor and was preparing to start BCG but had persistent gross hematuria.  He underwent cystoscopy and was found to have new tumor growth with active bleeding. After reviewing the management options for treatment, he elected to proceed with the above surgical procedure(s). We have discussed the potential benefits and risks of the procedure, side effects of the proposed treatment, the likelihood of the patient achieving the goals of the procedure, and any potential problems that might occur during the procedure or recuperation. Informed consent has been obtained.  Description of procedure:  The patient was taken to the operating room and general anesthesia was induced.  The patient was placed in the dorsal lithotomy position, prepped and draped in the usual sterile fashion, and preoperative antibiotics were administered. A preoperative time-out was performed.   Cystourethroscopy was performed. Wesley Snyder sounds were used to dilate the urethral meatus and a 26 Fr resectoscope was inserted with the visual obturator.  The patient's urethra was examined and was normal with the prostatic urethra absent due to his  history of prior radical prostatectomy.  The bladder was then systematically examined in its entirety.  There was a 1 cm papillary bladder tumor at the right lateral bladder wall, a 1.5 cm posterior bladder tumor, and a sessile area of possible tumor vs poorly healing tissue at the trigone.  Using loop cautery resection, the entire tumor at each location was resected and removed for permanent pathologic analysis sequentially.  Hemostasis was then achieved with the loop cautery and the bladder was emptied and reinspected with no further bleeding noted at the end of the procedure.    The bladder was then emptied and the procedure ended.  The patient appeared to tolerate the procedure well and without complications.  The patient was able to be awakened and transferred to the recovery unit in satisfactory condition.    Wesley Curia MD

## 2018-02-11 NOTE — Interval H&P Note (Signed)
History and Physical Interval Note:  02/11/2018 3:02 PM  Wesley Snyder.  has presented today for surgery, with the diagnosis of BLADDER CANCER  The various methods of treatment have been discussed with the patient and family. After consideration of risks, benefits and other options for treatment, the patient has consented to  Procedure(s) with comments: TRANSURETHRAL RESECTION OF BLADDER TUMOR (TURBT) WITH CYSTOSCOPY (N/A) - GENERAL ANESTHESIA WITH PARALYSIS as a surgical intervention .  The patient's history has been reviewed, patient examined, no change in status, stable for surgery.  I have reviewed the patient's chart and labs.  Questions were answered to the patient's satisfaction.     Kerisha Goughnour,LES

## 2018-02-11 NOTE — Anesthesia Procedure Notes (Signed)
Procedure Name: Intubation Date/Time: 02/11/2018 3:56 PM Performed by: Sharlette Dense, CRNA Patient Re-evaluated:Patient Re-evaluated prior to induction Oxygen Delivery Method: Circle system utilized Preoxygenation: Pre-oxygenation with 100% oxygen Induction Type: IV induction Ventilation: Mask ventilation without difficulty and Oral airway inserted - appropriate to patient size Laryngoscope Size: Miller and 3 Grade View: Grade I Tube type: Oral Tube size: 7.5 mm Number of attempts: 1 Airway Equipment and Method: Stylet Placement Confirmation: positive ETCO2 Secured at: 22 cm Tube secured with: Tape Dental Injury: Teeth and Oropharynx as per pre-operative assessment

## 2018-02-11 NOTE — Transfer of Care (Signed)
Immediate Anesthesia Transfer of Care Note  Patient: Wesley Snyder.  Procedure(s) Performed: TRANSURETHRAL RESECTION OF BLADDER TUMOR (TURBT) WITH CYSTOSCOPY (N/A )  Patient Location: PACU  Anesthesia Type:General  Level of Consciousness: awake  Airway & Oxygen Therapy: Patient Spontanous Breathing and Patient connected to face mask oxygen  Post-op Assessment: Report given to RN and Post -op Vital signs reviewed and stable  Post vital signs: Reviewed and stable  Last Vitals:  Vitals Value Taken Time  BP    Temp    Pulse 69 02/11/2018  4:43 PM  Resp 19 02/11/2018  4:43 PM  SpO2 100 % 02/11/2018  4:43 PM  Vitals shown include unvalidated device data.  Last Pain:  Vitals:   02/11/18 1425  TempSrc:   PainSc: 0-No pain         Complications: No apparent anesthesia complications

## 2018-02-11 NOTE — Anesthesia Preprocedure Evaluation (Signed)
Anesthesia Evaluation  Patient identified by MRN, date of birth, ID band Patient awake    Reviewed: Allergy & Precautions, NPO status , Patient's Chart, lab work & pertinent test results  Airway Mallampati: II  TM Distance: >3 FB Neck ROM: Full    Dental no notable dental hx.    Pulmonary neg pulmonary ROS, former smoker,    Pulmonary exam normal breath sounds clear to auscultation       Cardiovascular + CAD and + CABG  Normal cardiovascular exam Rhythm:Regular Rate:Normal     Neuro/Psych negative neurological ROS  negative psych ROS   GI/Hepatic Neg liver ROS, GERD  ,  Endo/Other  negative endocrine ROS  Renal/GU negative Renal ROS  negative genitourinary   Musculoskeletal negative musculoskeletal ROS (+)   Abdominal   Peds negative pediatric ROS (+)  Hematology negative hematology ROS (+)   Anesthesia Other Findings   Reproductive/Obstetrics negative OB ROS                             Anesthesia Physical Anesthesia Plan  ASA: III  Anesthesia Plan: General   Post-op Pain Management:    Induction: Intravenous  PONV Risk Score and Plan: 2 and Ondansetron and Dexamethasone  Airway Management Planned: Oral ETT  Additional Equipment:   Intra-op Plan:   Post-operative Plan: Extubation in OR  Informed Consent: I have reviewed the patients History and Physical, chart, labs and discussed the procedure including the risks, benefits and alternatives for the proposed anesthesia with the patient or authorized representative who has indicated his/her understanding and acceptance.   Dental advisory given  Plan Discussed with: CRNA and Surgeon  Anesthesia Plan Comments:         Anesthesia Quick Evaluation

## 2018-02-11 NOTE — Anesthesia Postprocedure Evaluation (Signed)
Anesthesia Post Note  Patient: Wesley Snyder.  Procedure(s) Performed: TRANSURETHRAL RESECTION OF BLADDER TUMOR (TURBT) WITH CYSTOSCOPY (N/A )     Patient location during evaluation: PACU Anesthesia Type: General Level of consciousness: awake and alert Pain management: pain level controlled Vital Signs Assessment: post-procedure vital signs reviewed and stable Respiratory status: spontaneous breathing, nonlabored ventilation, respiratory function stable and patient connected to nasal cannula oxygen Cardiovascular status: blood pressure returned to baseline and stable Postop Assessment: no apparent nausea or vomiting Anesthetic complications: no    Last Vitals:  Vitals:   02/11/18 1725 02/11/18 1820  BP: (!) 155/83 (!) 190/96  Pulse: 68 70  Resp: 16 20  Temp: 36.5 C 36.4 C  SpO2: 96% 98%    Last Pain:  Vitals:   02/11/18 1643  TempSrc:   PainSc: 0-No pain                 Abbigayle Toole S

## 2018-02-12 ENCOUNTER — Encounter (HOSPITAL_COMMUNITY): Payer: Self-pay | Admitting: Urology

## 2018-03-14 ENCOUNTER — Other Ambulatory Visit: Payer: Self-pay | Admitting: Urology

## 2018-03-15 ENCOUNTER — Other Ambulatory Visit: Payer: Self-pay

## 2018-03-15 ENCOUNTER — Encounter (HOSPITAL_COMMUNITY): Payer: Self-pay | Admitting: *Deleted

## 2018-03-15 NOTE — H&P (Signed)
CC/HPI: Urothelial carcinoma of the right renal pelvis and bladder   Wesley Snyder returns today for further follow-up. He has now 1 month out from his most recent TURBT that indicated multifocal high-grade, T1 urothelial carcinoma. Our plan was to proceed with BCG but he has continued to have persistent gross hematuria. He was noted to have a positive urine culture at his last visit and was treated with appropriate antibiotic therapy. Currently, he continues to have mild hematuria on most but not every day. He has not been passing any blood clots. He denies any associated dysuria. He continues to have severe incontinence which has been persistent since his initial TURBT.     ALLERGIES: None    MEDICATIONS: Aspirin  Lisinopril     GU PSH: Catheterization For Collection Of Specimen, Single Patient, All Places Of Service - 01/28/2018 Cysto Remove Stent FB Sim - 01/08/2018, 01/23/2017 Cysto Uretero Biopsy Fulgura, Right - 12/24/2017, Right - 11/16/2016 Cysto Uretero W/excise Tumor, Right - 01/11/2017, Right - 10/23/2016 Cystoscopy - 11/23/2017 Cystoscopy Fulguration - 01/31/2018 Cystoscopy Insert Stent, Right - 12/24/2017, Right - 07/05/2017, Right - 01/11/2017, Right - 11/16/2016, Right - 10/23/2016 Cystoscopy TURBT 2-5 cm - 02/11/2018, Right - 12/24/2017 Cystoscopy Ureteroscopy, Right - 07/05/2017 Locm 300-399Mg /Ml Iodine,1Ml - 11/19/2017, 04/27/2017 Radical Prostatectomy    NON-GU PSH: Cataract surgery, Bilateral - 11/08/2017 Coronary Artery Bypass Grafting    GU PMH: Bladder Cancer overlapping sites - 01/08/2018 Gross hematuria - 12/10/2017, - 11/08/2017, - 09/27/2016 Renal pelvis cancer, right - 11/07/2016 History of kidney cancer - 09/27/2016 Prostate Cancer - 09/27/2016 Right renal neoplasm - 09/27/2016      PMH Notes:   1) Renal cell carcinoma: He is s/p an open left radical nephrectomy at [facility] in 1994.   2) Prostate cancer: He is s/p an open RRP at [facility] in 1994. He apparently  had a biochemical recurrence soon after primary therapy and has been on systemic therapy with ADT since then under his urologist in [place].   3) Urothelial carcinoma of the right kidney: He was found to have a right renal collecting system tumor on imaging in 2018 in his solitary right kidney. This was confirmed on ureteroscopic evaluation to be high grade urothelial carcinoma.   Aug 2018: Right ureteroscopy with laser ablation of right renal pelvic tumor - pathology indicated high-grade urothelial carcinoma  Sep 2018: Repeat right ureteroscopy with laser ablation of remaining right renal pelvic tumor - pathology confirmed high-grade urothelial carcinoma with a suspicious focus of lamina propria invasion  Nov 2018: Repeat right ureteroscopy - Minimal tumor remaining and completely laser ablated  May 2019: Cystoscopy with surveillance right ureteroscopy - Negative  Oct 2019: He develop recurrent hematuria, right ureteroscopy with laser ablation of small tumor in lower pole and TURBT of bladder tumor - High grade T1a urothelial carcinoma  Dec 2019: Recurrent hematuria and recurrent tumor  Dec 2019: TURBT - Multifocal high grade, T1 urothelial carcinoma      NON-GU PMH: Bacteriuria - 11/28/2016 Coronary Artery Disease Sleep Apnea    FAMILY HISTORY: 1 Daughter - Runs in Family 2 sons - Runs in Family Cirrhosis - Father Death - Mother, Father   SOCIAL HISTORY: Marital Status: Married Preferred Language: English; Ethnicity: Not Hispanic Or Latino; Race: White Current Smoking Status: Patient has never smoked.   Tobacco Use Assessment Completed: Used Tobacco in last 30 days? Has never drank.  Drinks 1 caffeinated drink per day.    REVIEW OF SYSTEMS:    GU  Review Male:   Patient reports burning/ pain with urination. Patient denies frequent urination, hard to postpone urination, get up at night to urinate, leakage of urine, stream starts and stops, trouble starting your streams, and have to  strain to urinate .  Gastrointestinal (Upper):   Patient denies nausea and vomiting.  Gastrointestinal (Lower):   Patient denies diarrhea and constipation.  Constitutional:   Patient denies fever, night sweats, weight loss, and fatigue.  Skin:   Patient denies skin rash/ lesion and itching.  Eyes:   Patient denies blurred vision and double vision.  Ears/ Nose/ Throat:   Patient denies sore throat and sinus problems.  Hematologic/Lymphatic:   Patient denies swollen glands and easy bruising.  Cardiovascular:   Patient denies leg swelling and chest pains.  Respiratory:   Patient denies cough and shortness of breath.  Endocrine:   Patient denies excessive thirst.  Musculoskeletal:   Patient denies back pain and joint pain.  Neurological:   Patient denies headaches and dizziness.  Psychologic:   Patient denies depression and anxiety.   VITAL SIGNS:     Weight 170 lb / 77.11 kg  Height 71 in / 180.34 cm  BMI 23.7 kg/m     MULTI-SYSTEM PHYSICAL EXAMINATION:    Constitutional: Well-nourished. No physical deformities. Normally developed. Good grooming.  Respiratory: No labored breathing, no use of accessory muscles. Clear bilaterally.  Cardiovascular: Normal temperature, normal extremity pulses, no swelling, no varicosities. Regular rate and rhythm        ASSESSMENT:      ICD-10 Details  1 GU:   Bladder Cancer overlapping sites - C67.8   2   Gross hematuria - R31.0    PLAN:     Notes:  1. Hematuria/bladder cancer: Unfortunately, he has again developed a very quick recurrence of a new bladder tumor and has new tumor growth at his old tumor site. This is highly concerning and suggests a very aggressive bladder cancer situation. Unfortunately, I can only offer him repeat transurethral resection at this time in order to trying get him to stop bleeding so that we can consider BCG induction therapy. I am concerned about the very aggressive course that his malignancy has taken over the past  couple of months. He will plan to proceed. We have reviewed the potential risks and potential complications of this procedure. He gives informed consent.          Next Appointment:      Next Appointment: 04/10/2018 12:45 PM    Appointment Type: [facility] OP Appointment    Location: [facility] [phone]    Provider: Raynelle Bring, M.D.    Reason for Visit: PO 3 WEEKS CYSTO , TURBT - NEEDS UA      * Signed by Raynelle Bring, M.D. on 03/14/18 at 5:28 PM (EST)*

## 2018-03-17 NOTE — Anesthesia Preprocedure Evaluation (Addendum)
Anesthesia Evaluation  Patient identified by MRN, date of birth, ID band Patient awake    Reviewed: Allergy & Precautions, NPO status , Patient's Chart, lab work & pertinent test results  History of Anesthesia Complications Negative for: history of anesthetic complications  Airway Mallampati: II  TM Distance: >3 FB Neck ROM: Full    Dental  (+) Dental Advisory Given, Missing,    Pulmonary former smoker,    Pulmonary exam normal breath sounds clear to auscultation       Cardiovascular hypertension, + CAD and + CABG  Normal cardiovascular exam Rhythm:Regular Rate:Normal  EKG 02/06/18: RBBB, LAFB    Neuro/Psych negative neurological ROS     GI/Hepatic negative GI ROS, Neg liver ROS,   Endo/Other  negative endocrine ROS  Renal/GU S/p left nephrectomy  Bladder ca     Musculoskeletal  (+) Arthritis , Osteoarthritis,    Abdominal   Peds  Hematology negative hematology ROS (+)   Anesthesia Other Findings Day of surgery medications reviewed with the patient.  Reproductive/Obstetrics                           Anesthesia Physical Anesthesia Plan  ASA: III  Anesthesia Plan: General   Post-op Pain Management:    Induction: Intravenous  PONV Risk Score and Plan: 2 and Treatment may vary due to age or medical condition and Ondansetron  Airway Management Planned: Oral ETT  Additional Equipment:   Intra-op Plan:   Post-operative Plan: Extubation in OR  Informed Consent: I have reviewed the patients History and Physical, chart, labs and discussed the procedure including the risks, benefits and alternatives for the proposed anesthesia with the patient or authorized representative who has indicated his/her understanding and acceptance.     Dental advisory given  Plan Discussed with: CRNA  Anesthesia Plan Comments: (No decadron please.)      Anesthesia Quick Evaluation

## 2018-03-18 ENCOUNTER — Encounter (HOSPITAL_COMMUNITY): Payer: Self-pay | Admitting: Certified Registered"

## 2018-03-18 ENCOUNTER — Ambulatory Visit (HOSPITAL_COMMUNITY)
Admission: RE | Admit: 2018-03-18 | Discharge: 2018-03-18 | Disposition: A | Discharge status: Home / Self Care | Payer: Medicare Other | Attending: Urology | Admitting: Urology

## 2018-03-18 ENCOUNTER — Ambulatory Visit (HOSPITAL_COMMUNITY): Payer: Medicare Other | Admitting: Anesthesiology

## 2018-03-18 ENCOUNTER — Ambulatory Visit (HOSPITAL_COMMUNITY): Payer: Medicare Other

## 2018-03-18 ENCOUNTER — Encounter (HOSPITAL_COMMUNITY)
Admission: RE | Disposition: A | Discharge status: Home / Self Care | Payer: Self-pay | Source: Home / Self Care | Attending: Urology

## 2018-03-18 DIAGNOSIS — Z79899 Other long term (current) drug therapy: Secondary | ICD-10-CM | POA: Diagnosis not present

## 2018-03-18 DIAGNOSIS — Z7982 Long term (current) use of aspirin: Secondary | ICD-10-CM | POA: Diagnosis not present

## 2018-03-18 DIAGNOSIS — R310 Gross hematuria: Secondary | ICD-10-CM | POA: Insufficient documentation

## 2018-03-18 DIAGNOSIS — I10 Essential (primary) hypertension: Secondary | ICD-10-CM | POA: Diagnosis not present

## 2018-03-18 DIAGNOSIS — Z905 Acquired absence of kidney: Secondary | ICD-10-CM | POA: Diagnosis not present

## 2018-03-18 DIAGNOSIS — G4730 Sleep apnea, unspecified: Secondary | ICD-10-CM | POA: Diagnosis not present

## 2018-03-18 DIAGNOSIS — C678 Malignant neoplasm of overlapping sites of bladder: Secondary | ICD-10-CM | POA: Diagnosis not present

## 2018-03-18 DIAGNOSIS — I2510 Atherosclerotic heart disease of native coronary artery without angina pectoris: Secondary | ICD-10-CM | POA: Insufficient documentation

## 2018-03-18 DIAGNOSIS — Z85528 Personal history of other malignant neoplasm of kidney: Secondary | ICD-10-CM | POA: Diagnosis not present

## 2018-03-18 DIAGNOSIS — Z8546 Personal history of malignant neoplasm of prostate: Secondary | ICD-10-CM | POA: Insufficient documentation

## 2018-03-18 DIAGNOSIS — Z951 Presence of aortocoronary bypass graft: Secondary | ICD-10-CM | POA: Insufficient documentation

## 2018-03-18 DIAGNOSIS — C679 Malignant neoplasm of bladder, unspecified: Secondary | ICD-10-CM | POA: Diagnosis present

## 2018-03-18 DIAGNOSIS — Z9079 Acquired absence of other genital organ(s): Secondary | ICD-10-CM | POA: Diagnosis not present

## 2018-03-18 HISTORY — PX: TRANSURETHRAL RESECTION OF BLADDER TUMOR: SHX2575

## 2018-03-18 LAB — BASIC METABOLIC PANEL
ANION GAP: 10 (ref 5–15)
BUN: 23 mg/dL (ref 8–23)
CHLORIDE: 105 mmol/L (ref 98–111)
CO2: 23 mmol/L (ref 22–32)
Calcium: 9.6 mg/dL (ref 8.9–10.3)
Creatinine, Ser: 1.26 mg/dL — ABNORMAL HIGH (ref 0.61–1.24)
GFR calc Af Amer: >60 mL/min (ref >60)
GFR calc non Af Amer: 52 mL/min — ABNORMAL LOW (ref >60)
Glucose, Bld: 91 mg/dL (ref 70–99)
POTASSIUM: 4.1 mmol/L (ref 3.5–5.1)
Sodium: 138 mmol/L (ref 135–145)

## 2018-03-18 LAB — CBC
HCT: 41.6 % (ref 39.0–52.0)
Hemoglobin: 13.4 g/dL (ref 13.0–17.0)
MCH: 30.5 pg (ref 26.0–34.0)
MCHC: 32.2 g/dL (ref 30.0–36.0)
MCV: 94.5 fL (ref 80.0–100.0)
Platelets: 141 K/uL — ABNORMAL LOW (ref 150–400)
RBC: 4.4 MIL/uL (ref 4.22–5.81)
RDW: 12.8 % (ref 11.5–15.5)
WBC: 8.2 K/uL (ref 4.0–10.5)
nRBC: 0 % (ref 0.0–0.2)

## 2018-03-18 SURGERY — TURBT (TRANSURETHRAL RESECTION OF BLADDER TUMOR)
Anesthesia: General

## 2018-03-18 MED ORDER — ACETAMINOPHEN 10 MG/ML IV SOLN: 1000.0000 mg | Freq: Once | INTRAVENOUS | Status: DC | PRN

## 2018-03-18 MED ORDER — SODIUM CHLORIDE 0.9 % IR SOLN
Status: DC | PRN
  Administered 2018-03-18: 9000 mL

## 2018-03-18 MED ORDER — EPHEDRINE 5 MG/ML INJ
INTRAVENOUS | Status: AC
  Filled 2018-03-18: qty 10

## 2018-03-18 MED ORDER — FENTANYL CITRATE (PF) 100 MCG/2ML IJ SOLN
INTRAMUSCULAR | Status: AC
  Administered 2018-03-18: 25 ug via INTRAVENOUS
  Filled 2018-03-18: qty 4

## 2018-03-18 MED ORDER — ONDANSETRON HCL 4 MG/2ML IJ SOLN
INTRAMUSCULAR | Status: DC | PRN
  Administered 2018-03-18: 4 mg via INTRAVENOUS

## 2018-03-18 MED ORDER — PROPOFOL 10 MG/ML IV BOLUS
INTRAVENOUS | Status: DC | PRN
  Administered 2018-03-18: 160 mg via INTRAVENOUS

## 2018-03-18 MED ORDER — PROPOFOL 10 MG/ML IV BOLUS
INTRAVENOUS | Status: AC
  Filled 2018-03-18: qty 20

## 2018-03-18 MED ORDER — FENTANYL CITRATE (PF) 100 MCG/2ML IJ SOLN
INTRAMUSCULAR | Status: DC | PRN
  Administered 2018-03-18 (×2): 25 ug via INTRAVENOUS
  Administered 2018-03-18: 50 ug via INTRAVENOUS

## 2018-03-18 MED ORDER — LIDOCAINE 2% (20 MG/ML) 5 ML SYRINGE
INTRAMUSCULAR | Status: AC
  Filled 2018-03-18: qty 5

## 2018-03-18 MED ORDER — CEFAZOLIN SODIUM-DEXTROSE 2-4 GM/100ML-% IV SOLN
2.0000 g | Freq: Once | INTRAVENOUS | Status: AC
  Administered 2018-03-18: 2 g via INTRAVENOUS
  Filled 2018-03-18: qty 100

## 2018-03-18 MED ORDER — LIDOCAINE 2% (20 MG/ML) 5 ML SYRINGE
INTRAMUSCULAR | Status: DC | PRN
  Administered 2018-03-18: 100 mg via INTRAVENOUS

## 2018-03-18 MED ORDER — EPHEDRINE SULFATE-NACL 50-0.9 MG/10ML-% IV SOSY
PREFILLED_SYRINGE | INTRAVENOUS | Status: DC | PRN
  Administered 2018-03-18: 10 mg via INTRAVENOUS

## 2018-03-18 MED ORDER — FENTANYL CITRATE (PF) 100 MCG/2ML IJ SOLN
INTRAMUSCULAR | Status: AC
  Filled 2018-03-18: qty 2

## 2018-03-18 MED ORDER — ONDANSETRON HCL 4 MG/2ML IJ SOLN
INTRAMUSCULAR | Status: AC
  Filled 2018-03-18: qty 2

## 2018-03-18 MED ORDER — FENTANYL CITRATE (PF) 100 MCG/2ML IJ SOLN
25.0000 ug | INTRAMUSCULAR | Status: DC | PRN
  Administered 2018-03-18 (×2): 50 ug via INTRAVENOUS
  Administered 2018-03-18 (×2): 25 ug via INTRAVENOUS

## 2018-03-18 MED ORDER — PHENAZOPYRIDINE HCL 200 MG PO TABS: 200.0000 mg | ORAL_TABLET | Freq: Three times a day (TID) | ORAL | 0 refills | Status: DC | PRN

## 2018-03-18 MED ORDER — GEMCITABINE CHEMO FOR BLADDER INSTILLATION 2000 MG
2000.0000 mg | Freq: Once | INTRAVENOUS | Status: AC
  Administered 2018-03-18: 2000 mg via INTRAVESICAL
  Filled 2018-03-18: qty 2000

## 2018-03-18 MED ORDER — LACTATED RINGERS IV SOLN
INTRAVENOUS | Status: DC
  Administered 2018-03-18 (×2): via INTRAVENOUS

## 2018-03-18 MED ORDER — IOHEXOL 300 MG/ML  SOLN
INTRAMUSCULAR | Status: DC | PRN
  Administered 2018-03-18: 7 mL

## 2018-03-18 MED ORDER — PROMETHAZINE HCL 25 MG/ML IJ SOLN: 6.2500 mg | INTRAMUSCULAR | Status: DC | PRN

## 2018-03-18 SURGICAL SUPPLY — 18 items
BAG URINE DRAINAGE (UROLOGICAL SUPPLIES) ×2 IMPLANT
BAG URO CATCHER STRL LF (MISCELLANEOUS) ×3 IMPLANT
CATH FOLEY 2WAY SLVR  5CC 16FR (CATHETERS) ×2
CATH FOLEY 2WAY SLVR 5CC 16FR (CATHETERS) IMPLANT
CATH INTERMIT  6FR 70CM (CATHETERS) ×2 IMPLANT
COVER WAND RF STERILE (DRAPES) IMPLANT
ELECT REM PT RETURN 15FT ADLT (MISCELLANEOUS) ×1 IMPLANT
GLOVE BIOGEL M STRL SZ7.5 (GLOVE) ×3 IMPLANT
GOWN STRL REUS W/TWL LRG LVL3 (GOWN DISPOSABLE) ×3 IMPLANT
LOOP CUT BIPOLAR 24F LRG (ELECTROSURGICAL) ×2 IMPLANT
MANIFOLD NEPTUNE II (INSTRUMENTS) ×3 IMPLANT
PACK CYSTO (CUSTOM PROCEDURE TRAY) ×3 IMPLANT
PLUG CATH AND CAP STER (CATHETERS) ×2 IMPLANT
SET ASPIRATION TUBING (TUBING) IMPLANT
SYRINGE IRR TOOMEY STRL 70CC (SYRINGE) ×2 IMPLANT
TUBING CONNECTING 10 (TUBING) ×2 IMPLANT
TUBING CONNECTING 10' (TUBING) ×1
TUBING UROLOGY SET (TUBING) ×3 IMPLANT

## 2018-03-18 NOTE — Op Note (Signed)
Preoperative diagnosis: Urothelial carcinoma of the bladder and right renal pelvis  Postoperative diagnosis: Urothelial carcinoma of the bladder and right renal pelvis  Procedures: 1.  Cystoscopy 2.  Exam under anesthesia 3.  Right retrograde pyelography with interpretation 4.  Transurethral resection of large bladder tumor (greater than 5 cm)  Surgeon: Pryor Curia MD  Anesthesia: General  EBL: Minimal  Complications: None  Specimen: Bladder tumor  Disposition of specimen: Pathology  Intraoperative findings: Right retrograde pyelography was performed with Omnipaque contrast via a 6 French ureteral catheter.  This demonstrated a normal caliber ureter without any filling defects.  The renal pelvis was filled and there were no filling defects or other abnormalities noted.  No hydronephrosis.  Indication: Wesley Snyder is an 85 year old gentleman with a history of a right solitary kidney status post left nephrectomy many years ago for renal cell carcinoma.  He was recently found to have a new right renal pelvic urothelial tumor and underwent treatment with laser ablation.  This was confirmed to be a high-grade urothelial carcinoma.  After reviewing options, the patient adamantly did not wish to proceed with radical therapy which would result in being dialysis dependent.  We therefore proceeded with multiple procedures to render him disease-free with laser ablation.  More recently, he developed a recurrence within the bladder.  He underwent initial transurethral resection which demonstrated evidence of a high-grade, T1 urothelial carcinoma.  He also had a recurrent tumor in the right renal pelvis ablated at that same time.  He was maintained with a ureteral stent considering his solitary kidney.  Upon removal of his stent, he continued to have hematuria and ultimately required repeat office cystoscopy as he was not able to be treated with BCG due to his continued gross hematuria.  This  revealed new tumor growth within the bladder and he underwent a repeat resection in mid December.  Again, he persisted in having hematuria and ultimately underwent another office cystoscopy last week for further evaluation indicating new tumor growth and tumor growth at the site of his prior tumor resection.  He therefore presents today to undergo further evaluation and repeat transurethral resection of his bladder tumors.  The potential risks, complications, and the expected recovery process were discussed in detail.  Informed consent was obtained.  Description of procedure: The patient was taken to the operating room and a general anesthetic was administered.  He was given preoperative antibiotics, placed in the dorsolithotomy position, and prepped and draped in the usual sterile fashion.  Next, a preoperative timeout was performed.  Cystourethroscopy was performed with both a 30 degree and 70 degree lens to allow a complete systematic evaluation of the bladder.  This revealed the right ureteral orifice to be in its expected anatomic location and effluxing clear urine.  No tumor growth was noted near the right ureteral orifice.  The prior resection site along the trigone and base of the bladder extending posteriorly and along the left lateral wall was noted.  There did appear to be new tumor growth along the lateral aspect of this prior resection.  In addition, much of the resection site appeared to be somewhat frondular, and although not definite for tumor, certainly raise suspicion.  Finally, there were 2 new smaller papillary tumors located along the right lateral bladder wall that had formed since his last transurethral resection.  Additionally, a few other small possible early developing papillary tumors were identified on the mucosa.  These areas were cauterized.  Attention then turned to  the right ureteral orifice.  Omnipaque contrast was used via a 6 [place] ureteral catheter to perform a right  retrograde pyelography.  No filling defects were noted in the renal pelvis or within the ureter to indicate upper tract disease presents.  Attention then returned to the bladder.  The cystoscope was removed and replaced with a 26 French resectoscope.  The entire previously resected tumor site was again resected with the cutting loop due to concern of recurrent tumor both within the resection site as well as obvious tumor growth along the periphery of this site.  The site was greater than 5 cm.  It encompassed the majority of the trigone extending just past the midline on the right and all the way across the left and to the left lateral wall and extended all the way posteriorly.  Once this area was completely re-resected, attention turned to the 2 smaller papillary tumors in the right lateral wall.  These were also completely resected.  Hemostasis was achieved with loop cautery.  All tumor specimen was evacuated from the bladder.  Reinspection of the bladder revealed no bleeding sites.  Hemostasis appeared excellent.  A 16 French catheter was then placed into the urethra.  The patient tolerated the procedure well without complications.  He was able to be awakened and transferred to the recovery unit in satisfactory condition.  The patient was administered 2 g of gemcitabine instilled intravesically in the post anesthesia care unit.  This was left indwelling for 1 hour.

## 2018-03-18 NOTE — Interval H&P Note (Signed)
History and Physical Interval Note:  03/18/2018 2:06 PM  Wesley Snyder.  has presented today for surgery, with the diagnosis of BLADDER CANCER  The various methods of treatment have been discussed with the patient and family. After consideration of risks, benefits and other options for treatment, the patient has consented to  Procedure(s) with comments: TRANSURETHRAL RESECTION OF BLADDER TUMOR (TURBT) WITH CYSTOSCOPY/ RIGHT RETROGRADE/ INTRAVESICAL INSTILLATION OF GEMCITAVINE POST OPERTIVE (N/A) - ONLY NEEDS 45 MIN as a surgical intervention .  The patient's history has been reviewed, patient examined, no change in status, stable for surgery.  I have reviewed the patient's chart and labs.  Questions were answered to the patient's satisfaction.     [employer]

## 2018-03-18 NOTE — Discharge Instructions (Signed)
1. You may see some blood in the urine and may have some burning with urination for 48-72 hours. You also may notice that you have to urinate more frequently or urgently after your procedure which is normal.  °2. You should call should you develop an inability urinate, fever > 101, persistent nausea and vomiting that prevents you from eating or drinking to stay hydrated.  °

## 2018-03-18 NOTE — Anesthesia Postprocedure Evaluation (Signed)
Anesthesia Post Note  Patient: Wesley Snyder.  Procedure(s) Performed: TRANSURETHRAL RESECTION OF BLADDER TUMOR (TURBT) WITH CYSTOSCOPY/ RIGHT RETROGRADE PYELOGRAM (N/A )     Patient location during evaluation: PACU Anesthesia Type: General Level of consciousness: awake and alert Pain management: pain level controlled Vital Signs Assessment: post-procedure vital signs reviewed and stable Respiratory status: spontaneous breathing, nonlabored ventilation and respiratory function stable Cardiovascular status: blood pressure returned to baseline and stable Postop Assessment: no apparent nausea or vomiting Anesthetic complications: no    Last Vitals:  Vitals:   03/18/18 1630 03/18/18 1645  BP: (!) 170/82 (!) 164/82  Pulse: 69 64  Resp: 19 (!) 21  Temp:    SpO2: 100% 100%    Last Pain:  Vitals:   03/18/18 1645  TempSrc:   PainSc: [place]

## 2018-03-18 NOTE — Transfer of Care (Signed)
Immediate Anesthesia Transfer of Care Note  Patient: Wesley Snyder.  Procedure(s) Performed: TRANSURETHRAL RESECTION OF BLADDER TUMOR (TURBT) WITH CYSTOSCOPY/ RIGHT RETROGRADE PYELOGRAM (N/A )  Patient Location: PACU  Anesthesia Type:General  Level of Consciousness: awake, alert  and oriented  Airway & Oxygen Therapy: Patient Spontanous Breathing and Patient connected to face mask oxygen  Post-op Assessment: Report given to RN and Post -op Vital signs reviewed and stable  Post vital signs: Reviewed and stable  Last Vitals:  Vitals Value Taken Time  BP    Temp    Pulse 69 03/18/2018  4:01 PM  Resp 19 03/18/2018  4:01 PM  SpO2 100 % 03/18/2018  4:01 PM  Vitals shown include unvalidated device data.  Last Pain:  Vitals:   03/18/18 1317  TempSrc:   PainSc: 0-No pain      Patients Stated Pain Goal: 4 (24/26/83 4196)  Complications: No apparent anesthesia complications

## 2018-03-18 NOTE — Anesthesia Procedure Notes (Signed)
Procedure Name: LMA Insertion Date/Time: 03/18/2018 3:03 PM Performed by: Marrisa Kimber D, CRNA Pre-anesthesia Checklist: Patient identified, Emergency Drugs available, Suction available and Patient being monitored Patient Re-evaluated:Patient Re-evaluated prior to induction Oxygen Delivery Method: Circle system utilized Preoxygenation: Pre-oxygenation with 100% oxygen Induction Type: IV induction Ventilation: Mask ventilation without difficulty LMA: LMA inserted LMA Size: 4.0 Tube type: Oral Number of attempts: 1 Placement Confirmation: positive ETCO2 and breath sounds checked- equal and bilateral Tube secured with: Tape Dental Injury: Teeth and Oropharynx as per pre-operative assessment

## 2018-03-19 ENCOUNTER — Encounter (HOSPITAL_COMMUNITY): Payer: Self-pay | Admitting: Urology

## 2018-07-02 IMAGING — XA DG C-ARM 61-120 MIN-NO REPORT
7 series · 7 of 7 positions shown · non-contrast
Comparison: none

[Series 1: uro standard · 1 of 1 slices shown (1 of 7)]
[im 1/1]
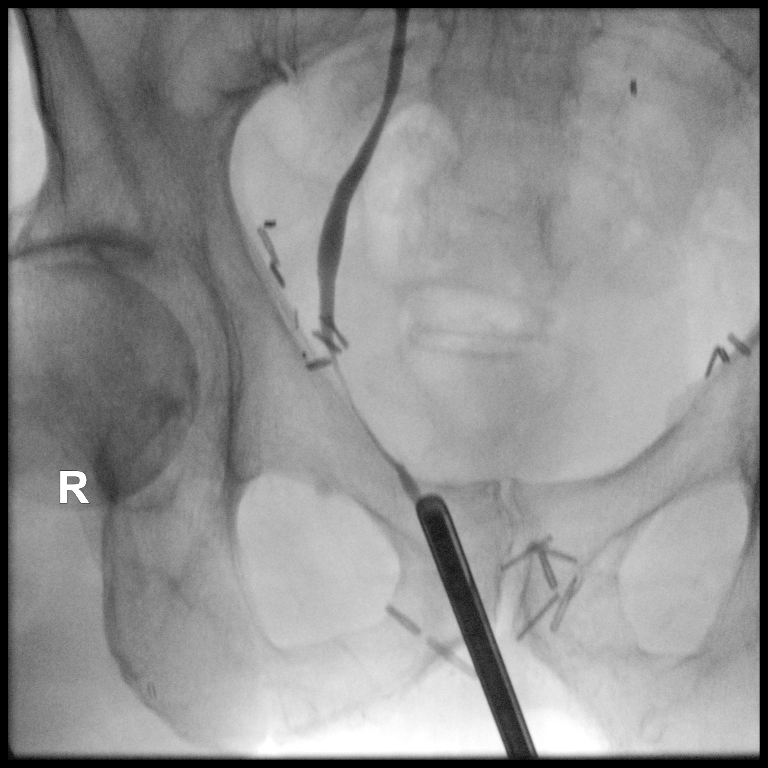

[Series 2: uro standard · 1 of 1 slices shown (2 of 7)]
[im 1/1]
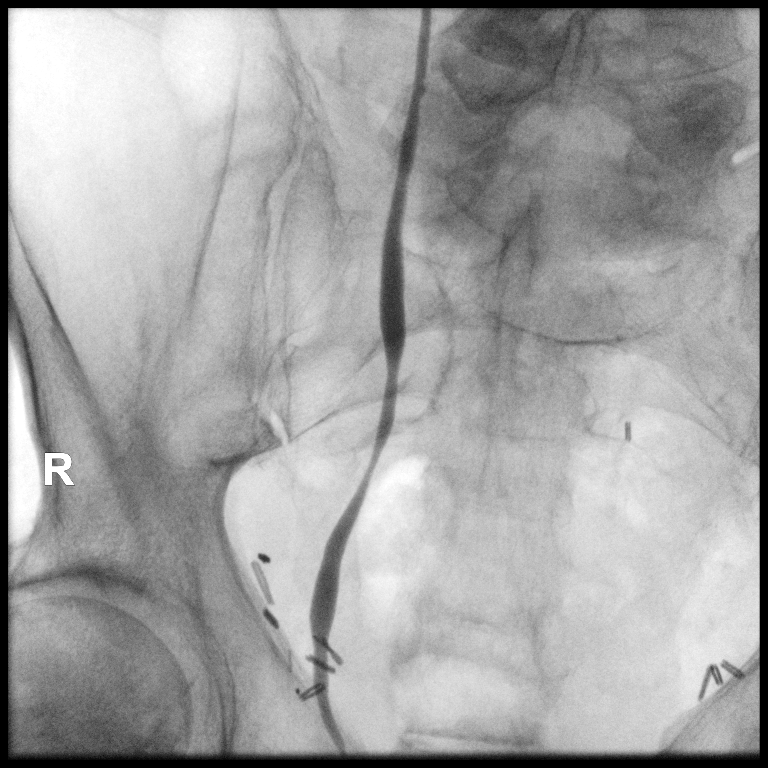

[Series 3: uro standard · 1 of 1 slices shown (3 of 7)]
[im 1/1]
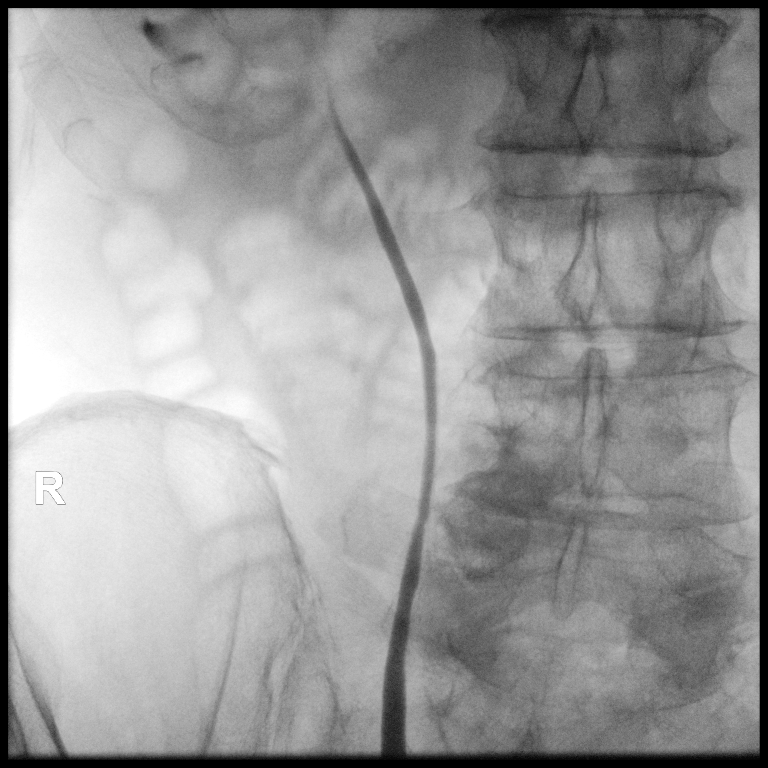

[Series 4: uro standard · 1 of 1 slices shown (4 of 7)]
[im 1/1]
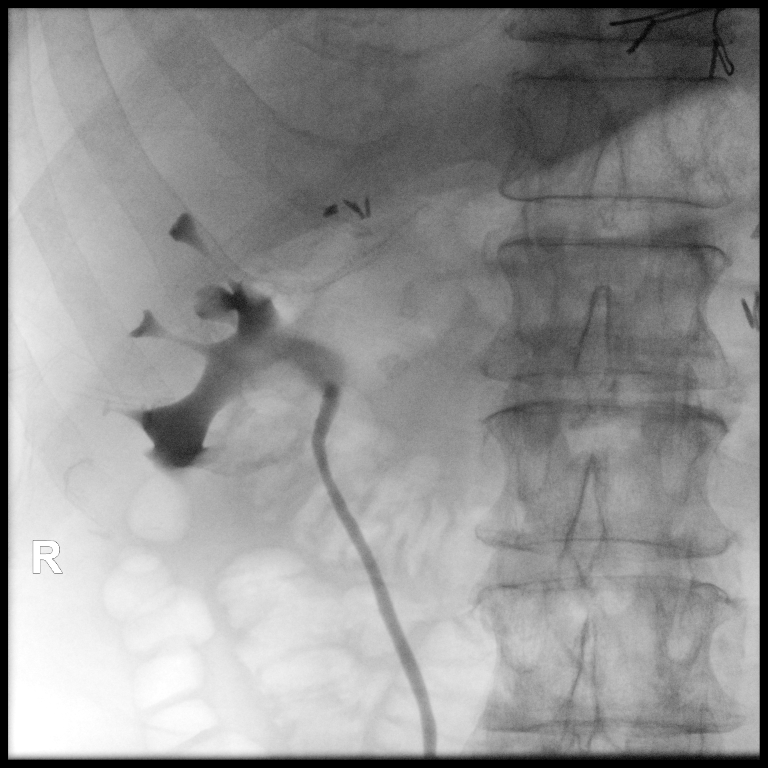

[Series 5: uro standard · 1 of 1 slices shown (5 of 7)]
[im 1/1]
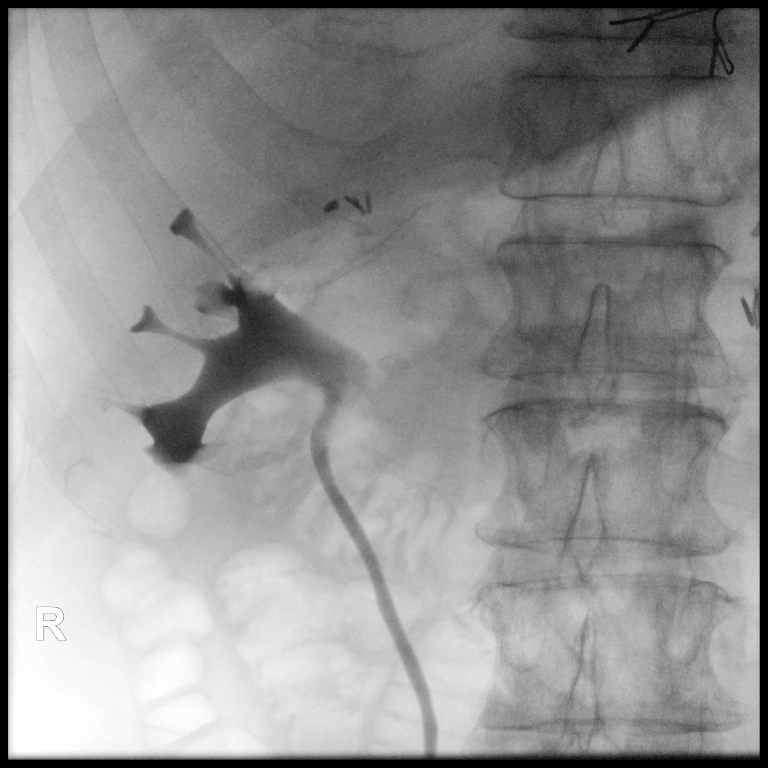

[Series 7: uro standard · 1 of 1 slices shown (6 of 7)]
[im 1/1]
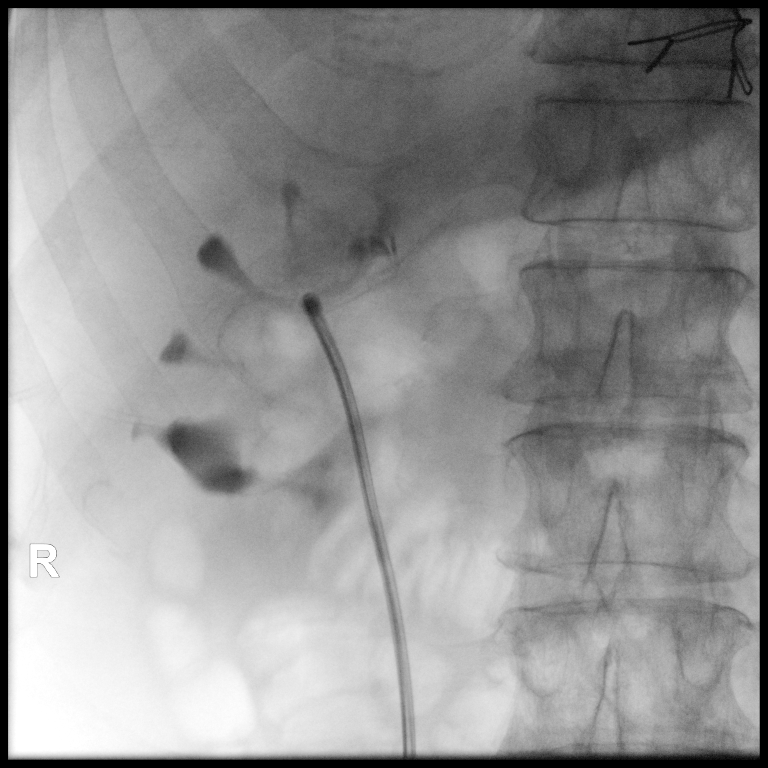

[Series 8: uro standard · 1 of 1 slices shown (7 of 7)]
[im 1/1]
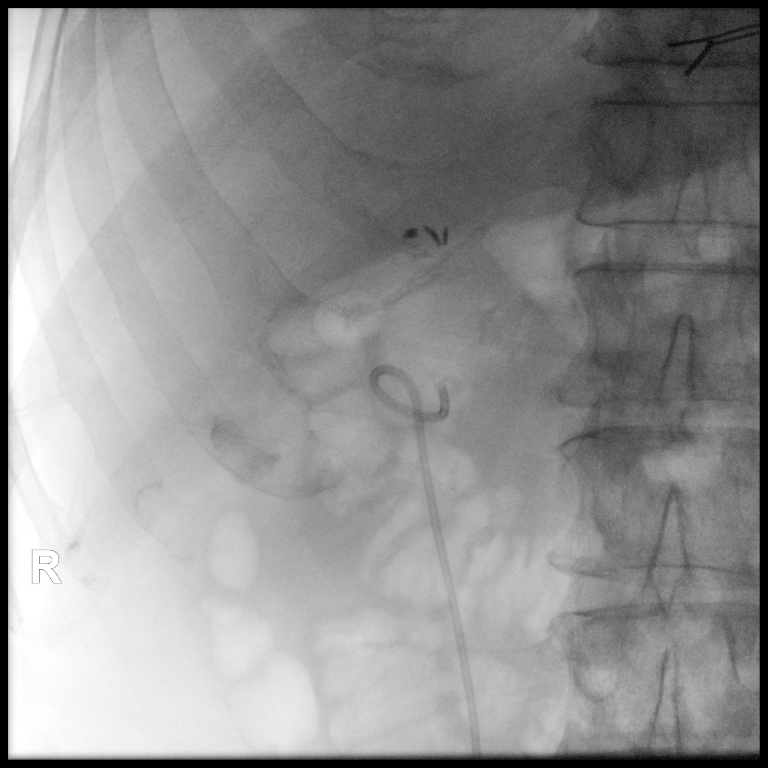

[7 of 7 positions shown; findings below may reference images not displayed]

Canned report from images found in remote index.

Refer to host system for actual result text.

## 2018-10-23 ENCOUNTER — Other Ambulatory Visit: Payer: Self-pay | Admitting: Urology

## 2018-10-25 NOTE — Progress Notes (Signed)
PCP - Majel Homer Cardiologist -   Chest x-ray -  EKG - 02-06-18 epic Stress Test -  ECHO -  Cardiac Cath -   Sleep Study -  CPAP -   Fasting Blood Sugar -  Checks Blood Sugar _____ times a day  Blood Thinner Instructions: Aspirin Instructions:  Anesthesia review:   Patient denies shortness of breath, fever, cough and chest pain at PAT appointment   Patient verbalized understanding of instructions that were given to them at the PAT appointment. Patient was also instructed that they will need to review over the PAT instructions again at home before surgery.

## 2018-10-25 NOTE — Patient Instructions (Addendum)
DUE TO COVID-19 ONLY ONE VISITOR IS ALLOWED TO COME WITH YOU AND STAY IN THE WAITING ROOM ONLY DURING PRE OP AND PROCEDURE DAY OF SURGERY. THE 1 VISITOR MAY VISIT WITH YOU AFTER SURGERY IN YOUR PRIVATE ROOM DURING VISITING HOURS ONLY!  YOU  HAD  A COVID 19 TEST ON 10-28-18.  ONCE YOUR COVID TEST IS COMPLETED, PLEASE BEGIN THE QUARANTINE INSTRUCTIONS AS OUTLINED IN YOUR HANDOUT.                Wesley Snyder.  10/25/2018   Your procedure is scheduled on: 10-31-18   Report to [facility] Main  Entrance   Report to admitting at     0700 AM     Call this number if you have problems the morning of surgery [phone]    Remember: Do not eat food or drink liquids :After Midnight. BRUSH YOUR TEETH MORNING OF SURGERY AND RINSE YOUR MOUTH OUT, NO CHEWING GUM CANDY OR MINTS.     Take these medicines the morning of surgery with A SIP OF WATER: none                                 You may not have any metal on your body including hair pins and              piercings  Do not wear jewelry,  lotions, powders or perfumes, deodorant                   Men may shave face and neck.   Do not bring valuables to the hospital. [place].  Contacts, dentures or bridgework may not be worn into surgery.      Patients discharged the day of surgery will not be allowed to drive home. IF YOU ARE HAVING SURGERY AND GOING HOME THE SAME DAY, YOU MUST HAVE AN ADULT TO DRIVE YOU HOME AND BE WITH YOU FOR 24 HOURS. YOU MAY GO HOME BY TAXI OR UBER OR ORTHERWISE, BUT AN ADULT MUST ACCOMPANY YOU HOME AND STAY WITH YOU FOR 24 HOURS.  Name and phone number of your driver: wife temple cell [phone]  Special Instructions: N/A              Please read over the following fact sheets you were given: _____________________________________________________________________             [facility] - Preparing for Surgery Before surgery, you can play an important  role.  Because skin is not sterile, your skin needs to be as free of germs as possible.  You can reduce the number of germs on your skin by washing with CHG (chlorahexidine gluconate) soap before surgery.  CHG is an antiseptic cleaner which kills germs and bonds with the skin to continue killing germs even after washing. Please DO NOT use if you have an allergy to CHG or antibacterial soaps.  If your skin becomes reddened/irritated stop using the CHG and inform your nurse when you arrive at Short Stay. Do not shave (including legs and underarms) for at least 48 hours prior to the first CHG shower.  You may shave your face/neck. Please follow these instructions carefully:  1.  Shower with CHG Soap the night before surgery and the  morning of Surgery.  2.  If you  choose to wash your hair, wash your hair first as usual with your  normal  shampoo.  3.  After you shampoo, rinse your hair and body thoroughly to remove the  shampoo.                           4.  Use CHG as you would any other liquid soap.  You can apply chg directly  to the skin and wash                       Gently with a scrungie or clean washcloth.  5.  Apply the CHG Soap to your body ONLY FROM THE NECK DOWN.   Do not use on face/ open                           Wound or open sores. Avoid contact with eyes, ears mouth and genitals (private parts).                       Wash face,  Genitals (private parts) with your normal soap.             6.  Wash thoroughly, paying special attention to the area where your surgery  will be performed.  7.  Thoroughly rinse your body with warm water from the neck down.  8.  DO NOT shower/wash with your normal soap after using and rinsing off  the CHG Soap.                9.  Pat yourself dry with a clean towel.            10.  Wear clean pajamas.            11.  Place clean sheets on your bed the night of your first shower and do not  sleep with pets. Day of Surgery : Do not apply any lotions/deodorants  the morning of surgery.  Please wear clean clothes to the hospital/surgery center.  FAILURE TO FOLLOW THESE INSTRUCTIONS MAY RESULT IN THE CANCELLATION OF YOUR SURGERY PATIENT SIGNATURE_________________________________  NURSE SIGNATURE__________________________________  ________________________________________________________________________

## 2018-10-28 ENCOUNTER — Other Ambulatory Visit (HOSPITAL_COMMUNITY)
Admission: RE | Admit: 2018-10-28 | Discharge: 2018-10-28 | Disposition: A | Discharge status: Home / Self Care | Payer: Medicare Other | Source: Ambulatory Visit | Attending: Urology | Admitting: Urology

## 2018-10-28 DIAGNOSIS — Z20828 Contact with and (suspected) exposure to other viral communicable diseases: Secondary | ICD-10-CM | POA: Insufficient documentation

## 2018-10-28 DIAGNOSIS — Z01812 Encounter for preprocedural laboratory examination: Secondary | ICD-10-CM | POA: Insufficient documentation

## 2018-10-28 LAB — SARS CORONAVIRUS 2 (TAT 6-24 HRS): SARS Coronavirus 2: NEGATIVE

## 2018-10-29 ENCOUNTER — Other Ambulatory Visit: Payer: Self-pay

## 2018-10-29 ENCOUNTER — Encounter (HOSPITAL_COMMUNITY): Payer: Self-pay

## 2018-10-29 ENCOUNTER — Encounter (HOSPITAL_COMMUNITY)
Admission: RE | Admit: 2018-10-29 | Discharge: 2018-10-29 | Disposition: A | Discharge status: Home / Self Care | Payer: Medicare Other | Source: Ambulatory Visit | Attending: Urology | Admitting: Urology

## 2018-10-29 DIAGNOSIS — Z905 Acquired absence of kidney: Secondary | ICD-10-CM | POA: Diagnosis not present

## 2018-10-29 DIAGNOSIS — G4730 Sleep apnea, unspecified: Secondary | ICD-10-CM | POA: Diagnosis not present

## 2018-10-29 DIAGNOSIS — I2510 Atherosclerotic heart disease of native coronary artery without angina pectoris: Secondary | ICD-10-CM | POA: Diagnosis not present

## 2018-10-29 DIAGNOSIS — C679 Malignant neoplasm of bladder, unspecified: Secondary | ICD-10-CM | POA: Diagnosis present

## 2018-10-29 DIAGNOSIS — M1990 Unspecified osteoarthritis, unspecified site: Secondary | ICD-10-CM | POA: Diagnosis not present

## 2018-10-29 DIAGNOSIS — C61 Malignant neoplasm of prostate: Secondary | ICD-10-CM | POA: Diagnosis not present

## 2018-10-29 DIAGNOSIS — I10 Essential (primary) hypertension: Secondary | ICD-10-CM | POA: Diagnosis not present

## 2018-10-29 DIAGNOSIS — C651 Malignant neoplasm of right renal pelvis: Secondary | ICD-10-CM | POA: Diagnosis not present

## 2018-10-29 DIAGNOSIS — Z951 Presence of aortocoronary bypass graft: Secondary | ICD-10-CM | POA: Diagnosis not present

## 2018-10-29 DIAGNOSIS — I700 Atherosclerosis of aorta: Secondary | ICD-10-CM | POA: Diagnosis not present

## 2018-10-29 HISTORY — DX: Other injury of unspecified body region, initial encounter: T14.8XXA

## 2018-10-29 HISTORY — DX: Atherosclerotic heart disease of native coronary artery without angina pectoris: I25.10

## 2018-10-29 LAB — BASIC METABOLIC PANEL
Anion gap: 7 (ref 5–15)
BUN: 19 mg/dL (ref 8–23)
CO2: 24 mmol/L (ref 22–32)
Calcium: 9.5 mg/dL (ref 8.9–10.3)
Chloride: 111 mmol/L (ref 98–111)
Creatinine, Ser: 1.46 mg/dL — ABNORMAL HIGH (ref 0.61–1.24)
GFR calc Af Amer: 51 mL/min — ABNORMAL LOW (ref >60)
GFR calc non Af Amer: 44 mL/min — ABNORMAL LOW (ref >60)
Glucose, Bld: 93 mg/dL (ref 70–99)
Potassium: 4.2 mmol/L (ref 3.5–5.1)
Sodium: 142 mmol/L (ref 135–145)

## 2018-10-29 LAB — CBC
HCT: 41.9 % (ref 39.0–52.0)
Hemoglobin: 13.4 g/dL (ref 13.0–17.0)
MCH: 30 pg (ref 26.0–34.0)
MCHC: 32 g/dL (ref 30.0–36.0)
MCV: 93.9 fL (ref 80.0–100.0)
Platelets: 120 10*3/uL — ABNORMAL LOW (ref 150–400)
RBC: 4.46 MIL/uL (ref 4.22–5.81)
RDW: 12.6 % (ref 11.5–15.5)
WBC: 6.6 10*3/uL (ref 4.0–10.5)
nRBC: 0 % (ref 0.0–0.2)

## 2018-10-29 NOTE — Progress Notes (Addendum)
PCP - Gwyndolyn Saxon shough Cardiologist - dr Barnabas Lister painter lov dr painter 05-02-2027 on chart  Chest x-ray - none EKG - 02-06-18 epic Stress Test - 10-12-16 dr Sharyon Cable on chart ECHO - 10-12-16 dr Sharyon Cable on chart Cardiac Cath - none  Sleep Study - none CPAP - none  Fasting Blood Sugar - n/a Checks Blood Sugar _____ times a day  Blood Thinner Instructions:none Aspirin Instructions:none Last Dose:  Anesthesia review: CHART TO [place] REVIEW OF CBC AND BMET 10-29-2018 RESULTS  Patient denies shortness of breath, fever, cough and chest pain at PAT appointment   Patient verbalized understanding of instructions that were given to them at the PAT appointment. Patient was also instructed that they will need to review over the PAT instructions again at home before surgery.

## 2018-10-30 ENCOUNTER — Encounter (HOSPITAL_COMMUNITY): Payer: Self-pay | Admitting: Certified Registered"

## 2018-10-30 NOTE — Anesthesia Preprocedure Evaluation (Addendum)
Anesthesia Evaluation  Patient identified by MRN, date of birth, ID band Patient awake    Reviewed: Allergy & Precautions, NPO status , Patient's Chart, lab work & pertinent test results  History of Anesthesia Complications Negative for: history of anesthetic complications  Airway Mallampati: II  TM Distance: >3 FB Neck ROM: Full    Dental  (+) Missing, Chipped   Pulmonary neg pulmonary ROS, former smoker,    Pulmonary exam normal        Cardiovascular hypertension, + CAD and + CABG  Normal cardiovascular exam     Neuro/Psych negative neurological ROS  negative psych ROS   GI/Hepatic negative GI ROS, Neg liver ROS,   Endo/Other  negative endocrine ROS  Renal/GU Renal InsufficiencyRenal disease  negative genitourinary   Musculoskeletal  (+) Arthritis ,   Abdominal   Peds  Hematology negative hematology ROS (+)   Anesthesia Other Findings Bladder cancer  Reproductive/Obstetrics                            Anesthesia Physical Anesthesia Plan  ASA: III  Anesthesia Plan: General   Post-op Pain Management:    Induction: Intravenous  PONV Risk Score and Plan: 3 and Ondansetron, Dexamethasone and Treatment may vary due to age or medical condition  Airway Management Planned: LMA  Additional Equipment: None  Intra-op Plan:   Post-operative Plan: Extubation in OR  Informed Consent: I have reviewed the patients History and Physical, chart, labs and discussed the procedure including the risks, benefits and alternatives for the proposed anesthesia with the patient or authorized representative who has indicated his/her understanding and acceptance.     Dental advisory given  Plan Discussed with:   Anesthesia Plan Comments:        Anesthesia Quick Evaluation

## 2018-10-30 NOTE — H&P (Signed)
Office Visit Report     10/18/2018   --------------------------------------------------------------------------------   Wesley Snyder  MRN: [id number]  DOB: Nov 13, 1932, 85 year old Male  SSN: [id number]   PRIMARY CARE:  Derrek Monaco, M  REFERRING:  Aloha Gell. Hurt, MD  PROVIDER:  Raynelle Bring, M.D.  LOCATION:  [facility] [phone]     --------------------------------------------------------------------------------   CC/HPI: 1. Bladder cancer  2. Urothelial carcinoma of the right renal pelvis  3. Prostate cancer   Wesley Snyder returns today for cystoscopic surveillance. He completed a repeat 6 week course of BCG in July. He tolerated therapy well. He has not had any hematuria up until today when he noticed painless gross hematuria. He has not had any right flank pain. He follows up today for surveillance cystoscopy and after having undergone upper tract imaging for surveillance of his right renal pelvic tumor. He also follows up for prostate cancer surveillance.     ALLERGIES: None    MEDICATIONS: Aspirin  Lisinopril     GU PSH: Bladder Instill AntiCA Agent - 09/09/2018, 09/02/2018, 08/26/2018, 08/12/2018, 08/05/2018, 07/29/2018, 06/03/2018, 05/27/2018, 05/20/2018, 05/13/2018, 05/06/2018, 04/29/2018 Catheterization For Collection Of Specimen, Single Patient, All Places Of Service - 01/28/2018 Cysto Remove Stent FB Sim - 01/08/2018, 01/23/2017 Cysto Uretero Biopsy Fulgura, Right - 12/24/2017, Right - 11/16/2016 Cysto Uretero W/excise Tumor, Right - 01/11/2017, Right - 10/23/2016 Cystoscopy - 07/12/2018, 03/14/2018, 11/23/2017 Cystoscopy Fulguration - 01/31/2018 Cystoscopy Insert Stent, Right - 12/24/2017, Right - 07/05/2017, Right - 01/11/2017, Right - 11/16/2016, Right - 10/23/2016 Cystoscopy TURBT >5 cm - 03/18/2018 Cystoscopy TURBT 2-5 cm - 02/11/2018, Right - 12/24/2017 Cystoscopy Ureteroscopy, Right - 07/05/2017 Locm 300-399Mg /Ml Iodine,1Ml - 10/15/2018, 11/19/2017, 04/27/2017 Radical  Prostatectomy     NON-GU PSH: Cataract surgery, Bilateral - 11/08/2017 Coronary Artery Bypass Grafting     GU PMH: Bladder Cancer overlapping sites - 01/08/2018 Gross hematuria - 12/10/2017, - 11/08/2017, - 2018 Renal pelvis cancer, right - 11/07/2016 History of kidney cancer - 2018 Prostate Cancer - 2018 Right renal neoplasm - 2018      PMH Notes:   1) Renal cell carcinoma: He is s/p an open left radical nephrectomy at [facility] in 1994.   2) Prostate cancer: He is s/p an open RRP at [facility] in 1994. He apparently had a biochemical recurrence soon after primary therapy and has been on systemic therapy with ADT since then under his urologist in [place].   3) Urothelial carcinoma of the right kidney: He was found to have a right renal collecting system tumor on imaging in 2018 in his solitary right kidney. This was confirmed on ureteroscopic evaluation to be high grade urothelial carcinoma.   Aug 2018: Right ureteroscopy with laser ablation of right renal pelvic tumor - pathology indicated high-grade urothelial carcinoma  Sep 2018: Repeat right ureteroscopy with laser ablation of remaining right renal pelvic tumor - pathology confirmed high-grade urothelial carcinoma with a suspicious focus of lamina propria invasion  Nov 2018: Repeat right ureteroscopy - Minimal tumor remaining and completely laser ablated  May 2019: Cystoscopy with surveillance right ureteroscopy - Negative  Oct 2019: He develop recurrent hematuria, right ureteroscopy with laser ablation of small tumor in lower pole and TURBT of bladder tumor - High grade T1a urothelial carcinoma  Dec 2019: Recurrent hematuria and recurrent tumor  Dec 2019: TURBT - Multifocal high grade, T1 urothelial carcinoma  Jan 2020: TURBT - Recurrent multifocal high grade, T1 urothelial carcinoma, post-op intravesical gemcitabine  Feb-Apr 2020: 6  week induction BCG  May 2020: Positive cytology (no visible tumors)  Jun-Jul 2020:  Repeat 6 week induction BCG   4) Chronic bacteruria:      NON-GU PMH: Pyuria/other UA findings - 04/10/2018 Bacteriuria - 11/28/2016 Coronary Artery Disease Sleep Apnea    FAMILY HISTORY: 1 Daughter - Runs in Family 2 sons - Runs in Family Cirrhosis - Father Death - Mother, Father   SOCIAL HISTORY: Marital Status: Married Preferred Language: English; Ethnicity: Not Hispanic Or Latino; Race: White Current Smoking Status: Patient has never smoked.   Tobacco Use Assessment Completed: Used Tobacco in last 30 days? Has never drank.  Drinks 1 caffeinated drink per day.    REVIEW OF SYSTEMS:    GU Review Male:   Patient reports frequent urination and leakage of urine. Patient denies hard to postpone urination, burning/ pain with urination, get up at night to urinate, stream starts and stops, trouble starting your streams, and have to strain to urinate .  Gastrointestinal (Lower):   Patient denies diarrhea and constipation.  Gastrointestinal (Upper):   Patient denies nausea and vomiting.  Constitutional:   Patient reports weight loss. Patient denies fever, night sweats, and fatigue.  Skin:   Patient denies skin rash/ lesion and itching.  Eyes:   Patient denies blurred vision and double vision.  Ears/ Nose/ Throat:   Patient denies sore throat and sinus problems.  Hematologic/Lymphatic:   Patient denies swollen glands and easy bruising.  Cardiovascular:   Patient denies leg swelling and chest pains.  Respiratory:   Patient denies cough and shortness of breath.  Endocrine:   Patient denies excessive thirst.  Musculoskeletal:   Patient denies back pain and joint pain.  Neurological:   Patient denies headaches and dizziness.  Psychologic:   Patient denies depression and anxiety.   VITAL SIGNS:      10/18/2018 02:51 PM  Weight 165 lb / 74.84 kg  Height 70 in / 177.8 cm  BP 126/86 mmHg  Pulse 85 /min  Temperature 97.7 F / 36.5 C  BMI 23.7 kg/m   GU PHYSICAL EXAMINATION:     Urethral Meatus: Normal size. No lesion, no wart, no discharge, no polyp. Normal location.   MULTI-SYSTEM PHYSICAL EXAMINATION:    Constitutional: Well-nourished. No physical deformities. Normally developed. Good grooming.  Respiratory: No labored breathing, no use of accessory muscles. Clear bilaterally.  Cardiovascular: Normal temperature, normal extremity pulses, no swelling, no varicosities. Regular rate and rhythm.  Gastrointestinal: No mass, no tenderness, no rigidity, non obese abdomen. No CVA tenderness.     PAST DATA REVIEWED:  Source Of History:  Patient  Urine Test Review:   Urinalysis  X-Ray Review: C.T. Abdomen/Pelvis: Reviewed Films.     10/17/18 09/27/16  PSA  Total PSA <0.015 ng/mL <0.015 ng/dL   Notes:                     CT ABDOMEN AND PELVIS WITH CONTRAST 10/15/2018   --------------------------------------------------------------------------------   Wesley Snyder  MRN: [id number] Ordering Provider: Raynelle Bring, JR  DOB: 1932/10/17 Date Collected: 10/15/2018 15:15:00  SSN: [id number] Date Completed: 10/15/2018 12:43:39   --------------------------------------------------------------------------------   CLINICAL DATA: History of left nephrectomy in 1994 for renal cell  carcinoma. History of prostatectomy in 1994 for prostate cancer with  biochemical recurrence treated with hormone therapy. History of  high-grade upper tract urothelial carcinoma of the right renal  pelvis status post ureteroscopic laser ablation in 2018. History of  multiple bladder cancers status post TURBT in  2019 and 2020 with BCG  therapy. Restaging.   EXAM:  CT ABDOMEN AND PELVIS WITH CONTRAST   TECHNIQUE:  Multidetector CT imaging of the abdomen and pelvis was performed  using the standard protocol following bolus administration of  intravenous contrast.   CONTRAST: 80 cc Omnipaque 300 IV.   COMPARISON: 11/19/2017 CT abdomen/pelvis.   FINDINGS:  Lower chest: Solid 6 mm subpleural  anterior left lower lobe  pulmonary nodule (series 3/image 5) is stable since 11/19/2017 CT,  considered benign. Stable calcified right lung base granulomas. No  acute abnormality at the lung bases. Intact visualized lower  sternotomy wires. Coronary atherosclerosis.   Hepatobiliary: Normal liver size. No liver mass. Cholecystectomy.  Bile ducts are stable and within normal post cholecystectomy limits.   Pancreas: Normal, with no mass or duct dilation.   Spleen: Normal size. No mass.   Adrenals/Urinary Tract: No discrete adrenal nodules. Status post  left nephrectomy. No mass or fluid collection in the left  nephrectomy bed. No right hydronephrosis. Simple 1.6 cm interpolar  right renal cyst. Subcentimeter hypodense anterior interpolar right  renal cortical lesion is too small to characterize and is unchanged,  considered benign. There is a new irregular filling defect in the  lower right renal collecting system measuring 1.1 x 0.8 x 1.6 cm  (series 6/image 33 and series 702/image 67). Normal caliber right  ureter. No filling defects or urothelial wall thickening in the  right ureter. Relatively collapsed bladder with no definite bladder  wall thickening or discrete bladder mass.   Stomach/Bowel: Normal non-distended stomach. Normal caliber small  bowel with no small bowel wall thickening. Normal appendix. Moderate  sigmoid diverticulosis, with no large bowel wall thickening or  significant pericolonic fat stranding.   Vascular/Lymphatic: Atherosclerotic nonaneurysmal abdominal aorta.  Patent portal, splenic, hepatic and right renal veins. No  pathologically enlarged lymph nodes in the abdomen or pelvis.   Reproductive: Status post prostatectomy, with no discrete mass or  fluid collection in the prostatectomy bed.   Other: No pneumoperitoneum, ascites or focal fluid collection.   Musculoskeletal: No aggressive appearing focal osseous lesions.  Healing subacute posterolateral  right eleventh rib fracture.  Moderate to marked lumbar spondylosis.   IMPRESSION:  1. New irregular 1.1 x 0.8 x 1.6 cm filling defect in the lower  right renal collecting system, highly suspicious for upper tract  urothelial carcinoma. No right hydronephrosis. Normal caliber right  ureter. No right ureteral or bladder lesions.  2. No lymphadenopathy or other findings of metastatic disease in the  abdomen or pelvis.  3. No evidence of local tumor recurrence in the left nephrectomy or  prostatectomy beds.  4. Aortic Atherosclerosis (ICD10-I70.0).   These results will be called to the ordering clinician or  representative by the Radiologist Assistant, and communication  documented in the PACS or zVision Dashboard.    Electronically Signed  By: Ilona Sorrel M.D.  On: 10/15/2018 12:41     PROCEDURES:         Flexible Cystoscopy - 52000  Indication: Urothelial carcinoma Risks, benefits, and potential complications of the procedure were discussed with the patient including infection, bleeding, voiding discomfort, urinary retention, fever, chills, sepsis, and others. All questions were answered. Informed consent was obtained. Sterile technique and intraurethral analgesia were used.  Meatus:  Normal size. Normal location. Normal condition.  Urethra:  No strictures.  External Sphincter:  Normal.  Verumontanum:  Verumontanum Surgically Absent.  Prostate:  Prostate Surgically Absent.  Bladder Neck:  Non-obstructing.  Ureteral Orifices:  Visualization was obscured from blood in the urine.  Bladder:  Visualization was obscured due to blood in the urine. There was concern for a possible papillary tumor toward the left side of the trigone. Bladder washing has been obtained for cytology.      Chaperone: AJ The procedure was well-tolerated and without complications. Instructions were given to call the office immediately if questions or problems.         Urinalysis w/Scope Dipstick Dipstick  Cont'd Micro  Color: Brown Bilirubin: Invalid mg/dL WBC/hpf: 0 - 5/hpf  Appearance: Turbid Ketones: Invalid mg/dL RBC/hpf: >60/hpf  Specific Gravity: Invalid Blood: Invalid ery/uL Bacteria: Few (10-25/hpf)  pH: Invalid Protein: Invalid mg/dL Cystals: NS (Not Seen)  Glucose: Invalid mg/dL Urobilinogen: Invalid mg/dL Casts: NS (Not Seen)    Nitrites: Invalid Trichomonas: Not Present    Leukocyte Esterase: Invalid leu/uL Mucous: Not Present      Epithelial Cells: 0 - 5/hpf      Yeast: NS (Not Seen)      Sperm: Not Present    Notes: INVAL DUE TO SPECIMEN COLOR. MICROSCOPIC NOT CONCENTRATED    ASSESSMENT:      ICD-10 Details  1 GU:   Bladder Cancer overlapping sites - C67.8   2   Renal pelvis cancer, right - C65.1   3 NON-GU:   Bacteriuria - R82.71    PLAN:           Orders Labs Urine Cytology, Urine Culture  Lab Notes: Washing          Schedule Return Visit/Planned Activity: Other See Visit Notes             Note: Will call to schedule surgery          Document Letter(s):  Created for Patient: Clinical Summary         Notes:   1. Urothelial carcinoma of the right renal pelvis and bladder: We reviewed his CT scan indicating recurrent tumor in the lower pole of the right kidney. We also discussed this possible bladder tumor recurrence despite his recent repeat induction course of BCG. As such, I have recommended that he proceed with cystoscopy, possible TURBT, right retrograde pyelography, right ureteroscopy with laser ablation of tumor, right ureteral stent placement. We reviewed this procedure in detail including the potential risks, complications, and the expected recovery process. He gives informed consent. His urine culture will be obtained and he will be treated with appropriate preprocedure antibiotics.   2. Prostate cancer: His PSA remains undetectable.       * Signed by Raynelle Bring, M.D. on 10/18/18 at 5:04 PM (EDT)*

## 2018-10-30 NOTE — Progress Notes (Signed)
Pt hard of hearing. Therefore, pt's wief was advised of that the surgery time for 10-31-18 is now 7:30 AM. Pt to report to Short Stay at 5:30 AM. Wife's verbalized understanding

## 2018-10-31 ENCOUNTER — Ambulatory Visit (HOSPITAL_COMMUNITY): Payer: Medicare Other

## 2018-10-31 ENCOUNTER — Ambulatory Visit (HOSPITAL_COMMUNITY)
Admission: RE | Admit: 2018-10-31 | Discharge: 2018-10-31 | Disposition: A | Discharge status: Home Health | Payer: Medicare Other | Attending: Urology | Admitting: Urology

## 2018-10-31 ENCOUNTER — Encounter (HOSPITAL_COMMUNITY): Payer: Self-pay | Admitting: *Deleted

## 2018-10-31 ENCOUNTER — Ambulatory Visit (HOSPITAL_COMMUNITY): Payer: Medicare Other | Admitting: Physician Assistant

## 2018-10-31 ENCOUNTER — Ambulatory Visit (HOSPITAL_COMMUNITY): Payer: Medicare Other | Admitting: Anesthesiology

## 2018-10-31 ENCOUNTER — Encounter (HOSPITAL_COMMUNITY)
Admission: RE | Disposition: A | Discharge status: Home Health | Payer: Self-pay | Source: Home / Self Care | Attending: Urology

## 2018-10-31 DIAGNOSIS — C679 Malignant neoplasm of bladder, unspecified: Secondary | ICD-10-CM | POA: Insufficient documentation

## 2018-10-31 DIAGNOSIS — Z905 Acquired absence of kidney: Secondary | ICD-10-CM | POA: Diagnosis not present

## 2018-10-31 DIAGNOSIS — I2510 Atherosclerotic heart disease of native coronary artery without angina pectoris: Secondary | ICD-10-CM | POA: Insufficient documentation

## 2018-10-31 DIAGNOSIS — C61 Malignant neoplasm of prostate: Secondary | ICD-10-CM | POA: Diagnosis not present

## 2018-10-31 DIAGNOSIS — Z951 Presence of aortocoronary bypass graft: Secondary | ICD-10-CM | POA: Insufficient documentation

## 2018-10-31 DIAGNOSIS — G4730 Sleep apnea, unspecified: Secondary | ICD-10-CM | POA: Insufficient documentation

## 2018-10-31 DIAGNOSIS — C651 Malignant neoplasm of right renal pelvis: Secondary | ICD-10-CM | POA: Diagnosis not present

## 2018-10-31 DIAGNOSIS — M1990 Unspecified osteoarthritis, unspecified site: Secondary | ICD-10-CM | POA: Insufficient documentation

## 2018-10-31 DIAGNOSIS — I700 Atherosclerosis of aorta: Secondary | ICD-10-CM | POA: Insufficient documentation

## 2018-10-31 DIAGNOSIS — I10 Essential (primary) hypertension: Secondary | ICD-10-CM | POA: Insufficient documentation

## 2018-10-31 HISTORY — PX: TRANSURETHRAL RESECTION OF BLADDER TUMOR: SHX2575

## 2018-10-31 SURGERY — TURBT (TRANSURETHRAL RESECTION OF BLADDER TUMOR)
Anesthesia: General | Laterality: Right

## 2018-10-31 MED ORDER — LIDOCAINE 2% (20 MG/ML) 5 ML SYRINGE
INTRAMUSCULAR | Status: AC
  Filled 2018-10-31: qty 10

## 2018-10-31 MED ORDER — CEFAZOLIN SODIUM-DEXTROSE 2-4 GM/100ML-% IV SOLN
2.0000 g | Freq: Once | INTRAVENOUS | Status: AC
  Administered 2018-10-31: 2 g via INTRAVENOUS
  Filled 2018-10-31: qty 100

## 2018-10-31 MED ORDER — 0.9 % SODIUM CHLORIDE (POUR BTL) OPTIME
TOPICAL | Status: DC | PRN
  Administered 2018-10-31: 08:00:00 1000 mL

## 2018-10-31 MED ORDER — FENTANYL CITRATE (PF) 100 MCG/2ML IJ SOLN
INTRAMUSCULAR | Status: AC
  Administered 2018-10-31: 25 ug via INTRAVENOUS
  Filled 2018-10-31: qty 2

## 2018-10-31 MED ORDER — DEXAMETHASONE SODIUM PHOSPHATE 10 MG/ML IJ SOLN
INTRAMUSCULAR | Status: DC | PRN
  Administered 2018-10-31: 4 mg via INTRAVENOUS

## 2018-10-31 MED ORDER — EPHEDRINE 5 MG/ML INJ
INTRAVENOUS | Status: AC
  Filled 2018-10-31: qty 20

## 2018-10-31 MED ORDER — ONDANSETRON HCL 4 MG/2ML IJ SOLN: 4.0000 mg | Freq: Once | INTRAMUSCULAR | Status: DC | PRN

## 2018-10-31 MED ORDER — OXYCODONE HCL 5 MG PO TABS: 5.0000 mg | ORAL_TABLET | Freq: Once | ORAL | Status: DC | PRN

## 2018-10-31 MED ORDER — ROCURONIUM BROMIDE 10 MG/ML (PF) SYRINGE
PREFILLED_SYRINGE | INTRAVENOUS | Status: AC
  Filled 2018-10-31: qty 20

## 2018-10-31 MED ORDER — SUCCINYLCHOLINE CHLORIDE 200 MG/10ML IV SOSY
PREFILLED_SYRINGE | INTRAVENOUS | Status: AC
  Filled 2018-10-31: qty 10

## 2018-10-31 MED ORDER — ONDANSETRON HCL 4 MG/2ML IJ SOLN
INTRAMUSCULAR | Status: DC | PRN
  Administered 2018-10-31: 4 mg via INTRAVENOUS

## 2018-10-31 MED ORDER — DEXAMETHASONE SODIUM PHOSPHATE 10 MG/ML IJ SOLN
INTRAMUSCULAR | Status: AC
  Filled 2018-10-31: qty 2

## 2018-10-31 MED ORDER — IOHEXOL 300 MG/ML  SOLN
INTRAMUSCULAR | Status: DC | PRN
  Administered 2018-10-31: 10 mL

## 2018-10-31 MED ORDER — LIDOCAINE 2% (20 MG/ML) 5 ML SYRINGE
INTRAMUSCULAR | Status: DC | PRN
  Administered 2018-10-31: 60 mg via INTRAVENOUS

## 2018-10-31 MED ORDER — PROPOFOL 10 MG/ML IV BOLUS
INTRAVENOUS | Status: DC | PRN
  Administered 2018-10-31: 100 mg via INTRAVENOUS

## 2018-10-31 MED ORDER — LACTATED RINGERS IV SOLN
INTRAVENOUS | Status: DC
  Administered 2018-10-31: 07:00:00 via INTRAVENOUS

## 2018-10-31 MED ORDER — FENTANYL CITRATE (PF) 100 MCG/2ML IJ SOLN
INTRAMUSCULAR | Status: AC
  Filled 2018-10-31: qty 2

## 2018-10-31 MED ORDER — OXYCODONE HCL 5 MG/5ML PO SOLN: 5.0000 mg | Freq: Once | ORAL | Status: DC | PRN

## 2018-10-31 MED ORDER — FENTANYL CITRATE (PF) 100 MCG/2ML IJ SOLN
INTRAMUSCULAR | Status: DC | PRN
  Administered 2018-10-31 (×4): 25 ug via INTRAVENOUS

## 2018-10-31 MED ORDER — GEMCITABINE CHEMO FOR BLADDER INSTILLATION 2000 MG
2000.0000 mg | Freq: Once | INTRAVENOUS | Status: AC
  Administered 2018-10-31: 2000 mg via INTRAVESICAL
  Filled 2018-10-31: qty 2000

## 2018-10-31 MED ORDER — PHENYLEPHRINE 40 MCG/ML (10ML) SYRINGE FOR IV PUSH (FOR BLOOD PRESSURE SUPPORT)
PREFILLED_SYRINGE | INTRAVENOUS | Status: AC
  Filled 2018-10-31: qty 10

## 2018-10-31 MED ORDER — PHENYLEPHRINE HCL (PRESSORS) 10 MG/ML IV SOLN
INTRAVENOUS | Status: AC
  Filled 2018-10-31: qty 2

## 2018-10-31 MED ORDER — ONDANSETRON HCL 4 MG/2ML IJ SOLN
INTRAMUSCULAR | Status: AC
  Filled 2018-10-31: qty 4

## 2018-10-31 MED ORDER — CEPHALEXIN 500 MG PO CAPS: 500.0000 mg | ORAL_CAPSULE | Freq: Three times a day (TID) | ORAL | 0 refills | Status: AC

## 2018-10-31 MED ORDER — FENTANYL CITRATE (PF) 100 MCG/2ML IJ SOLN
25.0000 ug | INTRAMUSCULAR | Status: DC | PRN
  Administered 2018-10-31: 10:00:00 25 ug via INTRAVENOUS
  Administered 2018-10-31: 50 ug via INTRAVENOUS

## 2018-10-31 MED ORDER — SODIUM CHLORIDE 0.9 % IR SOLN
Status: DC | PRN
  Administered 2018-10-31: 6000 mL

## 2018-10-31 MED ORDER — ROCURONIUM BROMIDE 10 MG/ML (PF) SYRINGE
PREFILLED_SYRINGE | INTRAVENOUS | Status: DC | PRN
  Administered 2018-10-31: 50 mg via INTRAVENOUS

## 2018-10-31 MED ORDER — SUGAMMADEX SODIUM 500 MG/5ML IV SOLN
INTRAVENOUS | Status: DC | PRN
  Administered 2018-10-31: 200 mg via INTRAVENOUS

## 2018-10-31 MED ORDER — EPHEDRINE SULFATE-NACL 50-0.9 MG/10ML-% IV SOSY
PREFILLED_SYRINGE | INTRAVENOUS | Status: DC | PRN
  Administered 2018-10-31 (×2): 5 mg via INTRAVENOUS
  Administered 2018-10-31: 10 mg via INTRAVENOUS
  Administered 2018-10-31 (×3): 5 mg via INTRAVENOUS

## 2018-10-31 MED ORDER — PROPOFOL 10 MG/ML IV BOLUS
INTRAVENOUS | Status: AC
  Filled 2018-10-31: qty 20

## 2018-10-31 SURGICAL SUPPLY — 21 items
BAG URINE DRAINAGE (UROLOGICAL SUPPLIES) ×2 IMPLANT
BAG URO CATCHER STRL LF (MISCELLANEOUS) ×3 IMPLANT
CATH FOLEY 2WAY SLVR  5CC 16FR (CATHETERS) ×2
CATH FOLEY 2WAY SLVR 5CC 16FR (CATHETERS) IMPLANT
COVER WAND RF STERILE (DRAPES) IMPLANT
ELECT REM PT RETURN 15FT ADLT (MISCELLANEOUS) ×1 IMPLANT
FIBER LASER TRAC TIP (UROLOGICAL SUPPLIES) ×2 IMPLANT
GLOVE BIOGEL M STRL SZ7.5 (GLOVE) ×3 IMPLANT
GOWN STRL REUS W/TWL LRG LVL3 (GOWN DISPOSABLE) ×3 IMPLANT
KIT TURNOVER KIT A (KITS) ×3 IMPLANT
LOOP CUT BIPOLAR 24F LRG (ELECTROSURGICAL) ×3 IMPLANT
MANIFOLD NEPTUNE II (INSTRUMENTS) ×3 IMPLANT
PACK CYSTO (CUSTOM PROCEDURE TRAY) ×3 IMPLANT
PLUG CATH AND CAP STER (CATHETERS) ×2 IMPLANT
SET ASPIRATION TUBING (TUBING) IMPLANT
SHEATH URETERAL 12FRX35CM (MISCELLANEOUS) ×2 IMPLANT
STENT CONTOUR 6FRX26X.038 (STENTS) ×2 IMPLANT
SYRINGE IRR TOOMEY STRL 70CC (SYRINGE) IMPLANT
TUBING CONNECTING 10 (TUBING) ×2 IMPLANT
TUBING CONNECTING 10' (TUBING) ×1
TUBING UROLOGY SET (TUBING) ×3 IMPLANT

## 2018-10-31 NOTE — Anesthesia Procedure Notes (Signed)
Date/Time: 10/31/2018 8:42 AM Performed by: Cynda Familia, CRNA Oxygen Delivery Method: Simple face mask Placement Confirmation: positive ETCO2 and breath sounds checked- equal and bilateral Dental Injury: Teeth and Oropharynx as per pre-operative assessment

## 2018-10-31 NOTE — Anesthesia Procedure Notes (Signed)
Procedure Name: Intubation Date/Time: 10/31/2018 7:38 AM Performed by: Cynda Familia, CRNA Pre-anesthesia Checklist: Patient identified, Emergency Drugs available, Suction available and Patient being monitored Patient Re-evaluated:Patient Re-evaluated prior to induction Oxygen Delivery Method: Circle System Utilized Preoxygenation: Pre-oxygenation with 100% oxygen Induction Type: IV induction Ventilation: Mask ventilation without difficulty Laryngoscope Size: 2 and Miller Grade View: Grade I Tube type: Oral Tube size: 7.5 mm Number of attempts: 1 Airway Equipment and Method: Stylet Placement Confirmation: ETT inserted through vocal cords under direct vision,  positive ETCO2 and breath sounds checked- equal and bilateral Secured at: 22 cm Tube secured with: Tape Dental Injury: Teeth and Oropharynx as per pre-operative assessment  Comments: Smooth IV induction Witman-- intubation AM CRNA atraumatic- teeth and mouth as preop- many missing chipped teeth-- bilat BS

## 2018-10-31 NOTE — Anesthesia Postprocedure Evaluation (Signed)
Anesthesia Post Note  Patient: Wesley Snyder.  Procedure(s) Performed: TRANSURETHRAL RESECTION OF BLADDER TUMOR (TURBT) WITH CYSTOSCOPY/BILATERAL RETROGRADE/ RIGHT URETEROSCOPY WITH LASER ABLATION OF TUMOR/URETERAL STENT (Right )     Patient location during evaluation: PACU Anesthesia Type: General Level of consciousness: awake and alert Pain management: pain level controlled Vital Signs Assessment: post-procedure vital signs reviewed and stable Respiratory status: spontaneous breathing, nonlabored ventilation and respiratory function stable Cardiovascular status: blood pressure returned to baseline and stable Postop Assessment: no apparent nausea or vomiting Anesthetic complications: no    Last Vitals:  Vitals:   10/31/18 1030 10/31/18 1115  BP: (!) 153/78 (!) 145/77  Pulse: (!) 58 (!) 58  Resp: 13 16  Temp: (!) 36.3 C 36.4 C  SpO2: 96% 98%    Last Pain:  Vitals:   10/31/18 1030  TempSrc:   PainSc: 3                  Lidia Collum

## 2018-10-31 NOTE — Interval H&P Note (Signed)
History and Physical Interval Note:  10/31/2018 7:01 AM  Wesley Snyder.  has presented today for surgery, with the diagnosis of BLADDER CANCER, RIGHT RENAL PELVIC TUMOR.  The various methods of treatment have been discussed with the patient and family. After consideration of risks, benefits and other options for treatment, the patient has consented to  Procedure(s): TRANSURETHRAL RESECTION OF BLADDER TUMOR (TURBT) WITH CYSTOSCOPY/RETROGRADE/ URETEROSCOPY WITH LASER ABLATION OF TUMOR/URETERAL STENT (Right) as a surgical intervention.  The patient's history has been reviewed, patient examined, no change in status, stable for surgery.  I have reviewed the patient's chart and labs.  Questions were answered to the patient's satisfaction.     [employer]

## 2018-10-31 NOTE — Op Note (Signed)
Preoperative diagnosis: 1.  Solitary right kidney 2.  Urothelial carcinoma of the right renal collecting system 3.  Urothelial carcinoma of the bladder (1.5 cm)  Postoperative diagnoses: 1.  Solitary right kidney 2.  Urothelial carcinoma of the right renal collecting system 3.  Urothelial carcinoma of the bladder (1.5 cm)  Procedures: 1.  Cystoscopy 2.  Right retrograde pyelography with interpretation 3.  Right ureteroscopy with laser ablation of right renal collecting system tumor 4.  Transurethral resection of bladder tumor 5.  Right ureteral stent placement (6 x 26 with string) 6.  Postoperative intravesical instillation of gemicitabine  Surgeon: Pryor Curia, MD  Anesthesia: General  Complications: None  EBL: Minimal  Intraoperative findings: Right retrograde pyelography was performed with Omnipaque contrast via a 6 French ureteral catheter.  This demonstrated a mildly dilated right ureter without evidence of filling defects or other abnormalities.  There was a filling defect noted in the lower pole of the right renal collecting system consistent with the tumor identified on his prior CT scan.  Specimens: 1.  Left-sided bladder tumor 2.  Base of left-sided bladder tumor  Disposition of specimens: Pathology  Indication: Wesley Snyder is an 85 year old gentleman with a history of urothelial carcinoma of his right renal collecting system and bladder.  He does have a solitary right kidney status post prior left nephrectomy in the past.  He has elected to forego any major surgical intervention for his malignancy and has thus proceeded with endoscopic management alone.  He also has been treated with intravesical therapy.  He was recently evaluated with upper tract imaging with a CT scan and also underwent surveillance cystoscopy.  He was noted to have hematuria on his clinical evaluation along with what appeared to be a solitary left-sided bladder tumor measuring approximately 1.5  cm.  On his CT imaging, he was noted to have an approximately 1.5 cm right lower pole tumor in his renal collecting system.  After discussing these findings, it was recommended that he proceed with the above procedures.  The potential risks, complications, and expected recovery process was discussed in detail.  Informed consent was obtained.  Description of procedure: The patient was taken to the operating room and a general anesthetic was administered.  He was given preoperative antibiotics, placed in the dorsolithotomy position, and prepped and draped in usual sterile fashion.  Next, a preoperative timeout was performed.  Cystourethroscopy was performed.  There was noted to be hematuria with difficult visualization of the bladder.  However, the small left-sided papillary bladder tumor was identified as previously noted.  The left ureteral orifice was identified and an attempt was made to cannulate this and perform a left retrograde pyelogram.  However, no contrast could be administered into the left ureter.  His left nephrectomy was performed in the distant past and it is felt that he possibly underwent a left nephro ureterectomy.  Considering the fact that no tumor mass was noted in this vicinity on his preoperative imaging, no further attempt was made to cannulate or image his possible left ureter.  Attention then turned to the right ureteral orifice.  A 6 French ureteral catheter was inserted and Omnipaque was administered.  Findings are as dictated above.  A 0.38 sensor guidewire was then advanced up into the renal collecting system under fluoroscopic guidance.  A 12/14 ureteral access sheath was advanced into the proximal ureter and flexible ureteroscopy was performed.  The entire renal collecting system was evaluated and the papillary renal tumor in  the lower pole of the right renal collecting system was identified.  Using a 200 m fiber on a setting of 1.2 J and 6 Hz, this tumor was ablated using the  holmium laser until no viable tumor was able to be grossly visualized.  Understandably, visualization did become somewhat obscured due to some bleeding in the renal collecting system as expected.  Once no persistent tumor could be identified, laser ablation was stopped and a ureteral stent was placed.  The guidewire was left in place and the ureteroscope and access sheath were removed.  The wire was then backloaded on the cystoscope and a 6 x 26 double-J ureteral stent was advanced over the wire using Seldinger technique.  This was positioned appropriately under fluoroscopic and cystoscopic guidance.  The wire was then removed with a good curl noted in the renal pelvis as well as within the bladder.  Attention then turned to the bladder.  The 51 French resectoscope was placed.  With continuous flow, visualization was improved.  The entire bladder was examined and the 1.5 cm tumor located on the left lateral aspect of the bladder was identified.  No other abnormalities were noted.  Using loop cutting resection, this tumor was resected and sent for pathologic analysis.  A separate deeper biopsy of the base of this tumor was also resected and sent separately.  Hemostasis was achieved using loop cautery.  The bladder was emptied and reinspected.  There was some blood noted from the ureteral stent as expected from his tumor ablation.  There was no ongoing bleeding from the bladder.  A 16 French catheter was then placed.  The patient was able to be extubated and transferred to recovery unit in satisfactory condition.  In the PACU, 2 g of intravesical gemcitabine was instilled in left indwelling for 1 hour.  It was then drained and he was given a voiding trial.

## 2018-10-31 NOTE — Discharge Instructions (Addendum)

## 2018-10-31 NOTE — Progress Notes (Signed)
Pt incontinent of large amount of urine, pt reports that is his baseline, wife reports this also and that Dr Alinda Money aware.

## 2018-10-31 NOTE — Transfer of Care (Signed)
Immediate Anesthesia Transfer of Care Note  Patient: Wesley Snyder.  Procedure(s) Performed: TRANSURETHRAL RESECTION OF BLADDER TUMOR (TURBT) WITH CYSTOSCOPY/BILATERAL RETROGRADE/ RIGHT URETEROSCOPY WITH LASER ABLATION OF TUMOR/URETERAL STENT (Right )  Patient Location: PACU  Anesthesia Type:General  Level of Consciousness: awake and alert   Airway & Oxygen Therapy: Patient Spontanous Breathing and Patient connected to face mask oxygen  Post-op Assessment: Report given to RN and Post -op Vital signs reviewed and stable  Post vital signs: Reviewed and stable  Last Vitals:  Vitals Value Taken Time  BP 149/78 10/31/18 0915  Temp    Pulse 71 10/31/18 0926  Resp 19 10/31/18 0926  SpO2 100 % 10/31/18 0926  Vitals shown include unvalidated device data.  Last Pain:  Vitals:   10/31/18 0900  TempSrc:   PainSc: Asleep         Complications: No apparent anesthesia complications

## 2018-11-01 ENCOUNTER — Encounter (HOSPITAL_COMMUNITY): Payer: Self-pay | Admitting: Urology

## 2018-12-04 ENCOUNTER — Other Ambulatory Visit: Payer: Self-pay | Admitting: Urology

## 2018-12-05 ENCOUNTER — Encounter (HOSPITAL_COMMUNITY): Payer: Self-pay

## 2018-12-05 ENCOUNTER — Encounter (HOSPITAL_COMMUNITY)
Admission: RE | Admit: 2018-12-05 | Discharge: 2018-12-05 | Disposition: A | Discharge status: Home / Self Care | Payer: Medicare Other | Source: Ambulatory Visit | Attending: Urology | Admitting: Urology

## 2018-12-05 ENCOUNTER — Other Ambulatory Visit: Payer: Self-pay

## 2018-12-05 ENCOUNTER — Other Ambulatory Visit (HOSPITAL_COMMUNITY)
Admission: RE | Admit: 2018-12-05 | Discharge: 2018-12-05 | Disposition: A | Discharge status: Home / Self Care | Payer: Medicare Other | Source: Ambulatory Visit | Attending: Urology | Admitting: Urology

## 2018-12-05 DIAGNOSIS — Z20828 Contact with and (suspected) exposure to other viral communicable diseases: Secondary | ICD-10-CM | POA: Insufficient documentation

## 2018-12-05 DIAGNOSIS — C679 Malignant neoplasm of bladder, unspecified: Secondary | ICD-10-CM | POA: Diagnosis not present

## 2018-12-05 DIAGNOSIS — Z01818 Encounter for other preprocedural examination: Secondary | ICD-10-CM | POA: Insufficient documentation

## 2018-12-05 LAB — BASIC METABOLIC PANEL
Anion gap: 9 (ref 5–15)
BUN: 22 mg/dL (ref 8–23)
CO2: 22 mmol/L (ref 22–32)
Calcium: 9.7 mg/dL (ref 8.9–10.3)
Chloride: 111 mmol/L (ref 98–111)
Creatinine, Ser: 1.46 mg/dL — ABNORMAL HIGH (ref 0.61–1.24)
GFR calc Af Amer: 51 mL/min — ABNORMAL LOW (ref >60)
GFR calc non Af Amer: 44 mL/min — ABNORMAL LOW (ref >60)
Glucose, Bld: 97 mg/dL (ref 70–99)
Potassium: 4.3 mmol/L (ref 3.5–5.1)
Sodium: 142 mmol/L (ref 135–145)

## 2018-12-05 LAB — CBC
HCT: 40.1 % (ref 39.0–52.0)
Hemoglobin: 13.1 g/dL (ref 13.0–17.0)
MCH: 30.9 pg (ref 26.0–34.0)
MCHC: 32.7 g/dL (ref 30.0–36.0)
MCV: 94.6 fL (ref 80.0–100.0)
Platelets: 123 10*3/uL — ABNORMAL LOW (ref 150–400)
RBC: 4.24 MIL/uL (ref 4.22–5.81)
RDW: 13.9 % (ref 11.5–15.5)
WBC: 7.6 10*3/uL (ref 4.0–10.5)
nRBC: 0 % (ref 0.0–0.2)

## 2018-12-05 NOTE — Progress Notes (Signed)
PCP - Majel Homer Cardiologist - Dr. Amanda Pea  lov 05-02-18 on chart  Chest x-ray - none EKG - 02-06-18 epic Stress Test - 10-02-16 chart ECHO - 10-13-26  chart Cardiac Cath - none  Sleep Study - none CPAP - n/a  Fasting Blood Sugar - n/a Checks Blood Sugar _____ times a day  Blood Thinner Instructions:none Aspirin Instructions: Last Dose:  Anesthesia review: cabg 2009  Patient denies shortness of breath, fever, cough and chest pain at PAT appointment   none   Patient verbalized understanding of instructions that were given to them at the PAT appointment. Patient was also instructed that they will need to review over the PAT instructions again at home before surgery.

## 2018-12-05 NOTE — Patient Instructions (Addendum)
DUE TO COVID-19 ONLY ONE VISITOR IS ALLOWED TO COME WITH YOU AND STAY IN THE WAITING ROOM ONLY DURING PRE OP AND PROCEDURE DAY OF SURGERY. THE 1 VISITOR MAY VISIT WITH YOU AFTER SURGERY IN YOUR PRIVATE ROOM DURING VISITING HOURS ONLY!  YOU NEED TO HAVE A COVID 19 TEST ON_______ @_______ , THIS TEST MUST BE DONE BEFORE SURGERY, COME  [address], [place] , [id number].  ([place]) ONCE YOUR COVID TEST IS COMPLETED, PLEASE BEGIN THE QUARANTINE INSTRUCTIONS AS OUTLINED IN YOUR HANDOUT.                Warren Danes.  12/05/2018   Your procedure is scheduled on: 12-09-18   Report to [facility] Main  Entrance   Report to admitting at   1000  AM     Call this number if you have problems the morning of surgery [phone]    Remember: Do not eat food or drink liquids :After Midnight.   BRUSH YOUR TEETH MORNING OF SURGERY AND RINSE YOUR MOUTH OUT, NO CHEWING GUM CANDY OR MINTS.     Take these medicines the morning of surgery with A SIP OF WATER: none  DO NOT TAKE ANY DIABETIC MEDICATIONS DAY OF YOUR SURGERY                               You may not have any metal on your body including hair pins and              piercings  Do not wear jewelry,, lotions, powders or perfumes, deodorant                       Men may shave face and neck.   Do not bring valuables to the hospital. [place].  Contacts, dentures or bridgework may not be worn into surgery.      Patients discharged the day of surgery will not be allowed to drive home. IF YOU ARE HAVING SURGERY AND GOING HOME THE SAME DAY, YOU MUST HAVE AN ADULT TO DRIVE YOU HOME AND BE WITH YOU FOR 24 HOURS. YOU MAY GO HOME BY TAXI OR UBER OR ORTHERWISE, BUT AN ADULT MUST ACCOMPANY YOU HOME AND STAY WITH YOU FOR 24 HOURS.  Name and phone number of your driver:  Special Instructions: N/A              Please read over the following fact sheets you were  given: _____________________________________________________________________             [facility] - Preparing for Surgery Before surgery, you can play an important role.  Because skin is not sterile, your skin needs to be as free of germs as possible.  You can reduce the number of germs on your skin by washing with CHG (chlorahexidine gluconate) soap before surgery.  CHG is an antiseptic cleaner which kills germs and bonds with the skin to continue killing germs even after washing. Please DO NOT use if you have an allergy to CHG or antibacterial soaps.  If your skin becomes reddened/irritated stop using the CHG and inform your nurse when you arrive at Short Stay. Do not shave (including legs and underarms) for at least 48 hours prior to the first CHG shower.  You may shave  your face/neck. Please follow these instructions carefully:  1.  Shower with CHG Soap the night before surgery and the  morning of Surgery.  2.  If you choose to wash your hair, wash your hair first as usual with your  normal  shampoo.  3.  After you shampoo, rinse your hair and body thoroughly to remove the  shampoo.                           4.  Use CHG as you would any other liquid soap.  You can apply chg directly  to the skin and wash                       Gently with a scrungie or clean washcloth.  5.  Apply the CHG Soap to your body ONLY FROM THE NECK DOWN.   Do not use on face/ open                           Wound or open sores. Avoid contact with eyes, ears mouth and genitals (private parts).                       Wash face,  Genitals (private parts) with your normal soap.             6.  Wash thoroughly, paying special attention to the area where your surgery  will be performed.  7.  Thoroughly rinse your body with warm water from the neck down.  8.  DO NOT shower/wash with your normal soap after using and rinsing off  the CHG Soap.                9.  Pat yourself dry with a clean towel.            10.  Wear  clean pajamas.            11.  Place clean sheets on your bed the night of your first shower and do not  sleep with pets. Day of Surgery : Do not apply any lotions/deodorants the morning of surgery.  Please wear clean clothes to the hospital/surgery center.  FAILURE TO FOLLOW THESE INSTRUCTIONS MAY RESULT IN THE CANCELLATION OF YOUR SURGERY PATIENT SIGNATURE_________________________________  NURSE SIGNATURE__________________________________  ________________________________________________________________________

## 2018-12-06 LAB — NOVEL CORONAVIRUS, NAA (HOSP ORDER, SEND-OUT TO REF LAB; TAT 18-24 HRS): SARS-CoV-2, NAA: NOT DETECTED

## 2018-12-06 NOTE — H&P (Signed)
Office Visit Report     11/13/2018   --------------------------------------------------------------------------------   Wesley Snyder  MRN: [id number]  DOB: 03/27/1932, 85 year old Male  SSN: [id number]   PRIMARY CARE:  Derrek Monaco, M  REFERRING:  Aloha Gell. Hurt, MD  PROVIDER:  Raynelle Bring, M.D.  LOCATION:  [facility] [phone]     --------------------------------------------------------------------------------   CC/HPI: 1. Bladder cancer  2. Urothelial carcinoma of the right renal pelvis  3. Prostate cancer   Bill follows up today after having undergone ureteroscopic laser ablation of his right renal pelvic tumor and transurethral resection of a small bladder tumor. He previously has been noted to have high-grade disease in his right renal collecting system. This was not biopsied again. He has not wanted to proceed with major surgical intervention at age 58. The pathology from his bladder cancer indicated a high-grade, Ta lesion. The base of this biopsy was negative. He feels that his hematuria has resolved now. He is scheduled for removal of his right ureteral stent which was left in place due to his solitary right kidney.     ALLERGIES: None    MEDICATIONS: Aspirin  Lisinopril     GU PSH: Bladder Instill AntiCA Agent - 10/31/2018, 09/09/2018, 09/02/2018, 08/26/2018, 08/12/2018, 08/05/2018, 07/29/2018, 06/03/2018, 05/27/2018, 05/20/2018, 05/13/2018, 05/06/2018, 04/29/2018 Catheterization For Collection Of Specimen, Single Patient, All Places Of Service - 01/28/2018 Cysto Remove Stent FB Sim - 01/08/2018, 01/23/2017 Cysto Uretero Biopsy Fulgura, Right - 12/24/2017, Right - 11/16/2016 Cysto Uretero W/excise Tumor, Right - 10/31/2018, Right - 01/11/2017, Right - 2018 Cystoscopy - 10/18/2018, 07/12/2018, 03/14/2018, 11/23/2017 Cystoscopy Fulguration - 01/31/2018 Cystoscopy Insert Stent, Right - 10/31/2018, Right - 10/31/2018, Right - 12/24/2017, Right - 07/05/2017, Right - 01/11/2017,  Right - 11/16/2016, Right - 2018 Cystoscopy TURBT <2 cm - 10/31/2018 Cystoscopy TURBT >5 cm - 03/18/2018 Cystoscopy TURBT 2-5 cm - 02/11/2018, Right - 12/24/2017 Cystoscopy Ureteroscopy, Right - 07/05/2017 Locm 300-399Mg /Ml Iodine,1Ml - 10/15/2018, 11/19/2017, 2019 Radical Prostatectomy     NON-GU PSH: Cataract surgery, Bilateral - 11/08/2017 Coronary Artery Bypass Grafting     GU PMH: Bladder Cancer overlapping sites - 01/08/2018 Gross hematuria - 12/10/2017, - 11/08/2017, - 2018 Renal pelvis cancer, right - 2018 History of kidney cancer - 2018 Prostate Cancer - 2018 Right renal neoplasm - 2018      PMH Notes:   1) Renal cell carcinoma: He is s/p an open left radical nephrectomy at [facility] in 1994.   2) Prostate cancer: He is s/p an open RRP at [facility] in 1994. He apparently had a biochemical recurrence soon after primary therapy and has been on systemic therapy with ADT since then under his urologist in [place].   3) Urothelial carcinoma of the right kidney: He was found to have a right renal collecting system tumor on imaging in 2018 in his solitary right kidney. This was confirmed on ureteroscopic evaluation to be high grade urothelial carcinoma.   Aug 2018: Right ureteroscopy with laser ablation of right renal pelvic tumor - pathology indicated high-grade urothelial carcinoma  Sep 2018: Repeat right ureteroscopy with laser ablation of remaining right renal pelvic tumor - pathology confirmed high-grade urothelial carcinoma with a suspicious focus of lamina propria invasion  Nov 2018: Repeat right ureteroscopy - Minimal tumor remaining and completely laser ablated  May 2019: Cystoscopy with surveillance right ureteroscopy - Negative  Oct 2019: He develop recurrent hematuria, right ureteroscopy with laser ablation of small tumor in lower pole and TURBT  of bladder tumor - High grade T1a urothelial carcinoma  Dec 2019: Recurrent hematuria and recurrent tumor  Dec 2019:  TURBT - Multifocal high grade, T1 urothelial carcinoma  Jan 2020: TURBT - Recurrent multifocal high grade, T1 urothelial carcinoma, post-op intravesical gemcitabine  Feb-Apr 2020: 6 week induction BCG  May 2020: Positive cytology (no visible tumors)  Jun-Jul 2020: Repeat 6 week induction BCG  Sep 2020: Right ureteroscopic laser ablation of 1.5 cm lower pole tumor, TURBT small left 1 cm bladder tumor   4) Chronic bacteruria:      NON-GU PMH: Pyuria/other UA findings - 04/10/2018 Bacteriuria - 11/28/2016 Coronary Artery Disease Sleep Apnea    FAMILY HISTORY: 1 Daughter - Runs in Family 2 sons - Runs in Family Cirrhosis - Father Death - Mother, Father   SOCIAL HISTORY: Marital Status: Married Preferred Language: English; Ethnicity: Not Hispanic Or Latino; Race: White Current Smoking Status: Patient has never smoked.   Tobacco Use Assessment Completed: Used Tobacco in last 30 days? Has never drank.  Drinks 1 caffeinated drink per day.    REVIEW OF SYSTEMS:    GU Review Male:   Patient reports get up at night to urinate, stream starts and stops, hard to postpone urination, and leakage of urine. Patient denies trouble starting your streams, frequent urination, burning/ pain with urination, and have to strain to urinate .  Gastrointestinal (Upper):   Patient denies nausea and vomiting.  Gastrointestinal (Lower):   Patient denies diarrhea and constipation.  Constitutional:   Patient denies fever, night sweats, weight loss, and fatigue.  Skin:   Patient denies skin rash/ lesion and itching.  Eyes:   Patient denies blurred vision and double vision.  Ears/ Nose/ Throat:   Patient denies sore throat and sinus problems.  Hematologic/Lymphatic:   Patient denies swollen glands and easy bruising.  Cardiovascular:   Patient denies leg swelling and chest pains.  Respiratory:   Patient denies cough and shortness of breath.  Endocrine:   Patient denies excessive thirst.  Musculoskeletal:    Patient denies back pain and joint pain.  Neurological:   Patient denies headaches and dizziness.  Psychologic:   Patient denies depression and anxiety.   VITAL SIGNS:      11/13/2018 10:37 AM  Weight 160 lb / 72.57 kg  Height 70 in / 177.8 cm  BP 155/87 mmHg  Pulse 48 /min  Temperature 96.9 F / 36.0 C  BMI 23.0 kg/m   GU PHYSICAL EXAMINATION:    Urethral Meatus: Normal size. No lesion, no wart, no discharge, no polyp. Normal location.   MULTI-SYSTEM PHYSICAL EXAMINATION:    Constitutional: Well-nourished. No physical deformities. Normally developed. Good grooming.  Respiratory: No labored breathing, no use of accessory muscles.   Cardiovascular: Normal temperature, normal extremity pulses, no swelling, no varicosities.     PAST DATA REVIEWED:  Source Of History:  Patient  Records Review:   Pathology Reports  Urine Test Review:   Urinalysis   10/17/18 09/27/16  PSA  Total PSA <0.015 ng/mL <0.015 ng/dL    PROCEDURES:         Flexible Cystoscopy Right Stent Removal - 52310  Risks, benefits, and potential complications of the procedure were discussed at length with the patient. All questions were answered. Informed consent was obtained. Sterile technique and intraurethral analgesia were used. Antibiotic prophylaxis: Bactrim  Meatus:  Normal size. Normal location. Normal condition.  Urethra:  No strictures.  External Sphincter:  Normal.  Verumontanum:  Verumontanum Surgically Absent.  Prostate:  Prostate Surgically Absent.  Bladder Neck:  Non-obstructing.  Ureteral Orifices:  Normal location. Normal size. Normal shape. Effluxed clear urine.  Bladder:  No trabeculation. No tumors. Normal mucosa. No stones.  The right ureteral stent was carefully removed with a grasping instrument.    The procedure was well-tolerated and without complications.         Urinalysis w/Scope Dipstick Dipstick Cont'd Micro  Color: Yellow Bilirubin: Neg mg/dL WBC/hpf: >60/hpf  Appearance:  Cloudy Ketones: Neg mg/dL RBC/hpf: 10 - 20/hpf  Specific Gravity: 1.020 Blood: 3+ ery/uL Bacteria: Many (>50/hpf)  pH: 5.5 Protein: 3+ mg/dL Cystals: NS (Not Seen)  Glucose: Neg mg/dL Urobilinogen: 0.2 mg/dL Casts: NS (Not Seen)    Nitrites: Positive Trichomonas: Not Present    Leukocyte Esterase: 3+ leu/uL Mucous: Present      Epithelial Cells: 0 - 5/hpf      Yeast: NS (Not Seen)      Sperm: Not Present    Notes: Microscopic not concentrated.    ASSESSMENT:      ICD-10 Details  2 GU:   Renal pelvis cancer, right - C65.1   3   Bladder Cancer overlapping sites - C67.8   1 NON-GU:   Bacteriuria - R82.71 Stable   PLAN:            Medications New Meds: Bactrim Ds 800 mg-160 mg tablet 1 tablet PO BID   #6  0 Refill(s)            Orders Labs Urine Culture          Schedule Return Visit/Planned Activity: Other See Visit Notes             Note: Will call to schedule surgery          Document Letter(s):  Created for Patient: Clinical Summary         Notes:   1. Urothelial carcinoma the bladder and right renal pelvis: His stent was removed today. His urine will be cultured and he will be treated empirically with Bactrim for the next 3 days pending his culture results. I have recommended that he proceed with repeat ureteroscopy for further evaluation of any remaining right renal pelvic tumor and repeat cystoscopy. If he is felt to be completely tumor free at that time, we will then discuss whether additional BCG would be warranted although he has not responded particularly well. Alternatively, we may consider other options and may consider immunotherapy especially considering his upper tract tumor as well.   2. Prostate cancer: His PSA is not due to be checked until the summer of 2021.       * Signed by Raynelle Bring, M.D. on 11/13/18 at 4:53 PM (EDT)*

## 2018-12-09 ENCOUNTER — Ambulatory Visit (HOSPITAL_COMMUNITY): Payer: Medicare Other | Admitting: Anesthesiology

## 2018-12-09 ENCOUNTER — Encounter (HOSPITAL_COMMUNITY)
Admission: RE | Disposition: A | Discharge status: Home / Self Care | Payer: Self-pay | Source: Home / Self Care | Attending: Urology

## 2018-12-09 ENCOUNTER — Other Ambulatory Visit: Payer: Self-pay

## 2018-12-09 ENCOUNTER — Ambulatory Visit (HOSPITAL_COMMUNITY): Payer: Medicare Other | Admitting: Physician Assistant

## 2018-12-09 ENCOUNTER — Ambulatory Visit (HOSPITAL_COMMUNITY): Payer: Medicare Other

## 2018-12-09 ENCOUNTER — Encounter (HOSPITAL_COMMUNITY): Payer: Self-pay

## 2018-12-09 ENCOUNTER — Ambulatory Visit (HOSPITAL_COMMUNITY)
Admission: RE | Admit: 2018-12-09 | Discharge: 2018-12-09 | Disposition: A | Discharge status: Home / Self Care | Payer: Medicare Other | Attending: Urology | Admitting: Urology

## 2018-12-09 DIAGNOSIS — Z7982 Long term (current) use of aspirin: Secondary | ICD-10-CM | POA: Diagnosis not present

## 2018-12-09 DIAGNOSIS — Z951 Presence of aortocoronary bypass graft: Secondary | ICD-10-CM | POA: Diagnosis not present

## 2018-12-09 DIAGNOSIS — I10 Essential (primary) hypertension: Secondary | ICD-10-CM | POA: Diagnosis not present

## 2018-12-09 DIAGNOSIS — Z9841 Cataract extraction status, right eye: Secondary | ICD-10-CM | POA: Diagnosis not present

## 2018-12-09 DIAGNOSIS — Z87891 Personal history of nicotine dependence: Secondary | ICD-10-CM | POA: Insufficient documentation

## 2018-12-09 DIAGNOSIS — I2510 Atherosclerotic heart disease of native coronary artery without angina pectoris: Secondary | ICD-10-CM | POA: Insufficient documentation

## 2018-12-09 DIAGNOSIS — Z8379 Family history of other diseases of the digestive system: Secondary | ICD-10-CM | POA: Diagnosis not present

## 2018-12-09 DIAGNOSIS — M1990 Unspecified osteoarthritis, unspecified site: Secondary | ICD-10-CM | POA: Insufficient documentation

## 2018-12-09 DIAGNOSIS — Z9842 Cataract extraction status, left eye: Secondary | ICD-10-CM | POA: Diagnosis not present

## 2018-12-09 DIAGNOSIS — Z8546 Personal history of malignant neoplasm of prostate: Secondary | ICD-10-CM | POA: Diagnosis not present

## 2018-12-09 DIAGNOSIS — C641 Malignant neoplasm of right kidney, except renal pelvis: Secondary | ICD-10-CM | POA: Insufficient documentation

## 2018-12-09 DIAGNOSIS — G4730 Sleep apnea, unspecified: Secondary | ICD-10-CM | POA: Insufficient documentation

## 2018-12-09 DIAGNOSIS — Z79899 Other long term (current) drug therapy: Secondary | ICD-10-CM | POA: Diagnosis not present

## 2018-12-09 DIAGNOSIS — C679 Malignant neoplasm of bladder, unspecified: Secondary | ICD-10-CM | POA: Diagnosis not present

## 2018-12-09 DIAGNOSIS — Z905 Acquired absence of kidney: Secondary | ICD-10-CM | POA: Insufficient documentation

## 2018-12-09 HISTORY — PX: CYSTOSCOPY W/ URETERAL STENT PLACEMENT: SHX1429

## 2018-12-09 SURGERY — CYSTOSCOPY, WITH RETROGRADE PYELOGRAM AND URETERAL STENT INSERTION
Anesthesia: General | Laterality: Right

## 2018-12-09 MED ORDER — SUCCINYLCHOLINE CHLORIDE 20 MG/ML IJ SOLN
INTRAMUSCULAR | Status: DC | PRN
  Administered 2018-12-09: 140 mg via INTRAVENOUS

## 2018-12-09 MED ORDER — ONDANSETRON HCL 4 MG/2ML IJ SOLN
INTRAMUSCULAR | Status: DC | PRN
  Administered 2018-12-09: 4 mg via INTRAVENOUS

## 2018-12-09 MED ORDER — FENTANYL CITRATE (PF) 100 MCG/2ML IJ SOLN
INTRAMUSCULAR | Status: AC
  Filled 2018-12-09: qty 2

## 2018-12-09 MED ORDER — ROCURONIUM BROMIDE 10 MG/ML (PF) SYRINGE
PREFILLED_SYRINGE | INTRAVENOUS | Status: DC | PRN
  Administered 2018-12-09: 10 mg via INTRAVENOUS

## 2018-12-09 MED ORDER — GLYCOPYRROLATE PF 0.2 MG/ML IJ SOSY
PREFILLED_SYRINGE | INTRAMUSCULAR | Status: DC | PRN
  Administered 2018-12-09 (×2): .1 mg via INTRAVENOUS

## 2018-12-09 MED ORDER — ACETAMINOPHEN 500 MG PO TABS
1000.0000 mg | ORAL_TABLET | Freq: Once | ORAL | Status: AC
  Administered 2018-12-09: 1000 mg via ORAL
  Filled 2018-12-09: qty 2

## 2018-12-09 MED ORDER — FENTANYL CITRATE (PF) 100 MCG/2ML IJ SOLN
INTRAMUSCULAR | Status: DC | PRN
  Administered 2018-12-09: 50 ug via INTRAVENOUS

## 2018-12-09 MED ORDER — SODIUM CHLORIDE 0.9 % IR SOLN
Status: DC | PRN
  Administered 2018-12-09: 3000 mL

## 2018-12-09 MED ORDER — FENTANYL CITRATE (PF) 100 MCG/2ML IJ SOLN: 25.0000 ug | INTRAMUSCULAR | Status: DC | PRN

## 2018-12-09 MED ORDER — BUPIVACAINE HCL (PF) 0.25 % IJ SOLN
INTRAMUSCULAR | Status: AC
  Filled 2018-12-09: qty 30

## 2018-12-09 MED ORDER — SODIUM CHLORIDE 0.9 % IR SOLN
Status: DC | PRN
  Administered 2018-12-09: 1000 mL via INTRAVESICAL

## 2018-12-09 MED ORDER — LACTATED RINGERS IV SOLN
INTRAVENOUS | Status: DC
  Administered 2018-12-09: 11:00:00 via INTRAVENOUS

## 2018-12-09 MED ORDER — PROPOFOL 10 MG/ML IV BOLUS
INTRAVENOUS | Status: DC | PRN
  Administered 2018-12-09: 70 mg via INTRAVENOUS

## 2018-12-09 MED ORDER — PROPOFOL 10 MG/ML IV BOLUS
INTRAVENOUS | Status: AC
  Filled 2018-12-09: qty 20

## 2018-12-09 MED ORDER — SODIUM CHLORIDE 0.9 % IV SOLN
2.0000 g | Freq: Once | INTRAVENOUS | Status: AC
  Administered 2018-12-09: 2 g via INTRAVENOUS
  Filled 2018-12-09: qty 20

## 2018-12-09 MED ORDER — IOHEXOL 300 MG/ML  SOLN
INTRAMUSCULAR | Status: DC | PRN
  Administered 2018-12-09: 13:00:00 50 mL via URETHRAL

## 2018-12-09 MED ORDER — LIDOCAINE HCL URETHRAL/MUCOSAL 2 % EX GEL
CUTANEOUS | Status: AC
  Filled 2018-12-09: qty 5

## 2018-12-09 MED ORDER — GLYCOPYRROLATE PF 0.2 MG/ML IJ SOSY
PREFILLED_SYRINGE | INTRAMUSCULAR | Status: AC
  Filled 2018-12-09: qty 1

## 2018-12-09 MED ORDER — DEXAMETHASONE SODIUM PHOSPHATE 10 MG/ML IJ SOLN
INTRAMUSCULAR | Status: DC | PRN
  Administered 2018-12-09: 5 mg via INTRAVENOUS

## 2018-12-09 SURGICAL SUPPLY — 15 items
BAG URO CATCHER STRL LF (MISCELLANEOUS) ×3 IMPLANT
CATH INTERMIT  6FR 70CM (CATHETERS) ×3 IMPLANT
CLOTH BEACON ORANGE TIMEOUT ST (SAFETY) ×3 IMPLANT
FIBER LASER TRAC TIP (UROLOGICAL SUPPLIES) ×2 IMPLANT
GLOVE BIOGEL M STRL SZ7.5 (GLOVE) ×3 IMPLANT
GOWN STRL REUS W/TWL LRG LVL3 (GOWN DISPOSABLE) ×6 IMPLANT
GUIDEWIRE STR DUAL SENSOR (WIRE) ×3 IMPLANT
KIT TURNOVER KIT A (KITS) IMPLANT
MANIFOLD NEPTUNE II (INSTRUMENTS) ×3 IMPLANT
PACK CYSTO (CUSTOM PROCEDURE TRAY) ×3 IMPLANT
SHEATH URETERAL 12FRX35CM (MISCELLANEOUS) ×2 IMPLANT
STENT URET 6FRX26 CONTOUR (STENTS) ×2 IMPLANT
TUBING CONNECTING 10 (TUBING) ×2 IMPLANT
TUBING CONNECTING 10' (TUBING) ×1
TUBING UROLOGY SET (TUBING) ×2 IMPLANT

## 2018-12-09 NOTE — Anesthesia Preprocedure Evaluation (Addendum)
Anesthesia Evaluation  Patient identified by MRN, date of birth, ID band Patient awake    Reviewed: Allergy & Precautions, NPO status , Patient's Chart, lab work & pertinent test results  Airway Mallampati: I  TM Distance: >3 FB Neck ROM: Full    Dental no notable dental hx. (+) Poor Dentition, Dental Advisory Given   Pulmonary neg pulmonary ROS, former smoker,    Pulmonary exam normal breath sounds clear to auscultation       Cardiovascular hypertension, + CAD  Normal cardiovascular exam Rhythm:Regular Rate:Normal     Neuro/Psych negative neurological ROS  negative psych ROS   GI/Hepatic negative GI ROS, Neg liver ROS,   Endo/Other  negative endocrine ROS  Renal/GU negative Renal ROS  negative genitourinary   Musculoskeletal  (+) Arthritis ,   Abdominal   Peds  Hematology negative hematology ROS (+)   Anesthesia Other Findings   Reproductive/Obstetrics                            Anesthesia Physical Anesthesia Plan  ASA: III  Anesthesia Plan: General   Post-op Pain Management:    Induction: Intravenous  PONV Risk Score and Plan: Ondansetron and Dexamethasone  Airway Management Planned: LMA  Additional Equipment:   Intra-op Plan:   Post-operative Plan: Extubation in OR  Informed Consent: I have reviewed the patients History and Physical, chart, labs and discussed the procedure including the risks, benefits and alternatives for the proposed anesthesia with the patient or authorized representative who has indicated his/her understanding and acceptance.     Dental advisory given  Plan Discussed with: CRNA  Anesthesia Plan Comments:         Anesthesia Quick Evaluation

## 2018-12-09 NOTE — Anesthesia Procedure Notes (Signed)
Procedure Name: Intubation Performed by: Gean Maidens, CRNA Pre-anesthesia Checklist: Patient identified, Emergency Drugs available, Suction available, Patient being monitored and Timeout performed Patient Re-evaluated:Patient Re-evaluated prior to induction Oxygen Delivery Method: Circle system utilized Preoxygenation: Pre-oxygenation with 100% oxygen Induction Type: IV induction Ventilation: Mask ventilation without difficulty Laryngoscope Size: Mac and 4 Grade View: Grade I Tube size: 7.5 mm Number of attempts: 1 Airway Equipment and Method: Stylet Placement Confirmation: ETT inserted through vocal cords under direct vision,  positive ETCO2 and breath sounds checked- equal and bilateral Secured at: 23 cm Tube secured with: Tape Dental Injury: Teeth and Oropharynx as per pre-operative assessment

## 2018-12-09 NOTE — Interval H&P Note (Signed)
History and Physical Interval Note:  12/09/2018 11:57 AM  Wesley Snyder.  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  The various methods of treatment have been discussed with the patient and family. After consideration of risks, benefits and other options for treatment, the patient has consented to  Procedure(s): CYSTOSCOPY WITH RETROGRADE PYELOGRAM/URETEROSCOPY WITH LASER ABLATION OF TUMOR/ URETERAL STENT PLACEMENT (Right) as a surgical intervention.  The patient's history has been reviewed, patient examined, no change in status, stable for surgery.  I have reviewed the patient's chart and labs.  Questions were answered to the patient's satisfaction.     [employer]

## 2018-12-09 NOTE — Anesthesia Postprocedure Evaluation (Signed)
Anesthesia Post Note  Patient: Wesley Snyder.  Procedure(s) Performed: CYSTOSCOPY WITH RETROGRADE PYELOGRAM/URETEROSCOPY WITH LASER ABLATION OF TUMOR/ URETERAL STENT PLACEMENT (Right )     Patient location during evaluation: PACU Anesthesia Type: General Level of consciousness: awake and alert Pain management: pain level controlled Vital Signs Assessment: post-procedure vital signs reviewed and stable Respiratory status: spontaneous breathing, nonlabored ventilation, respiratory function stable and patient connected to nasal cannula oxygen Cardiovascular status: blood pressure returned to baseline and stable Postop Assessment: no apparent nausea or vomiting Anesthetic complications: no    Last Vitals:  Vitals:   12/09/18 1415 12/09/18 1430  BP: (!) 151/85 (!) 158/92  Pulse: 63 (!) 55  Resp: 15 16  Temp: (!) 36.4 C (!) 36.4 C  SpO2: 98% 100%    Last Pain:  Vitals:   12/09/18 1430  TempSrc:   PainSc: 0-No pain                 Kallum Jorgensen L Ruble Buttler

## 2018-12-09 NOTE — Transfer of Care (Signed)
Immediate Anesthesia Transfer of Care Note  Patient: Wesley Snyder.  Procedure(s) Performed: CYSTOSCOPY WITH RETROGRADE PYELOGRAM/URETEROSCOPY WITH LASER ABLATION OF TUMOR/ URETERAL STENT PLACEMENT (Right )  Patient Location: PACU  Anesthesia Type:General  Level of Consciousness: awake, alert  and oriented  Airway & Oxygen Therapy: Patient Spontanous Breathing and Patient connected to face mask oxygen  Post-op Assessment: Report given to RN and Post -op Vital signs reviewed and stable  Post vital signs: Reviewed and stable  Last Vitals:  Vitals Value Taken Time  BP 162/82 12/09/18 1346  Temp 36.5 C 12/09/18 1345  Pulse 71 12/09/18 1348  Resp 16 12/09/18 1349  SpO2 100 % 12/09/18 1348  Vitals shown include unvalidated device data.  Last Pain:  Vitals:   12/09/18 1345  TempSrc:   PainSc: Asleep         Complications: No apparent anesthesia complications

## 2018-12-09 NOTE — Op Note (Addendum)
Preoperative diagnosis:  1.  Solitary right kidney 2.  Right upper tract urothelial carcinoma 3.  Urothelial carcinoma the bladder.  Postoperative diagnosis:  1.  Solitary right kidney 2.  Right upper tract urothelial carcinoma 3.  Urothelial carcinoma the bladder.  Procedure:  1. Cystoscopy 2. Right retrograde pyelogram with interpretation 3. Right ureteroscopy with laser ablation of right renal collecting system tumor 4. Right ureteral stent placement (6X 26 without string)  Surgeon: Roxy Horseman, Brooke Bonito. M.D. Assistant: Kerrie Pleasure, MD  Anesthesia: General  Complications: None  Intraoperative findings:  Cystourethroscopy revealed no evidence of recurrent bladder cancer.  Prior resection site visible without any tumor at margins.  Left ureteral orifice once again not visualized and may be surgically absent.  Right ureteral orifice widely patent. Right retrograde pyelogram demonstrated mildly dilated right ureter without any evidence of filling defects.  No large filling defects within the right renal collecting system. Right ureteroscopy and pyeloscopy performed.  Small papillary tumor encountered in the right lower pole collecting system; successful laser ablation of this residual tumor.  No other tumors found on pan pyeloscopy. Successful placement of right ureteral stent.  EBL: Minimal  Specimens: None Indication: Wesley Snyder.  is a 85 y.o. patient with a complicated history of urothelial carcinoma in his right renal collecting system and in his bladder.  Of note he has a solitary right kidney due to prior left nephrectomy in the remote past.  He has elected to forego any major surgical intervention for malignancy and has this proceeded with endoscopic management alone.  He also has been treated with intravesical therapy.  He recently had repeat resection of bladder tumor and laser ablation of right lower pole tumor.  He presents today for repeat cystoscopy and  ureteroscopy with possible laser ablation of any remaining right lower pole tumor.We have discussed the potential benefits and risks of the procedure, side effects of the proposed treatment, the likelihood of the patient achieving the goals of the procedure, and any potential problems that might occur during the procedure or recuperation. Informed consent has been obtained.  Description of procedure:  The patient was taken to the operating room and general anesthesia was induced.  The patient was placed in the dorsal lithotomy position, prepped and draped in the usual sterile fashion, and preoperative antibiotics were administered. A preoperative time-out was performed.   Cystourethroscopy was performed with the above-noted findings.  Attention was turned to the right ureteral orifice and open-ended ureteral catheter was inserted and Omnipaque was administered with the above-noted findings on retrograde pyelogram.  A 0.38 sensor wire was then advanced up into the renal collecting system under fluoroscopic guidance.  A 12/14 ureteral access sheath was advanced into the proximal ureter and flexible ureteroscopy and pyeloscopy was performed with the above-noted findings.  The entire right renal collecting system was evaluated.  There was a single small papillary renal tumor remaining in the lower pole.  Using a 200 m fiber on a setting of 1.0J and 6 Hz the tumor was ablated until there was no viable tumor remaining.  He then elected to proceed with ureteral stent placement.  The guidewire was left in place and the ureteroscope and access sheath were removed.  The entire right ureter was visualized and found to be without tumor on withdrawal of the ureteroscope.  The wire was then backloaded through the cystoscope.  A 6 x 26 JJ ureteral stent was advanced over this wire using Seldinger technique.  The wire was  removed and there was a good curl noted in the renal pelvis as well as the bladder.  The bladder was  emptied, and the cystoscope was removed.   The patient appeared to tolerate the procedure well and without complications.  The patient was able to be awakened and transferred to the recovery unit in satisfactory condition.

## 2018-12-09 NOTE — Discharge Instructions (Addendum)
1. You may see some blood in the urine and may have some burning with urination for 48-72 hours. You also may notice that you have to urinate more frequently or urgently after your procedure which is normal.  °2. You should call should you develop an inability urinate, fever > 101, persistent nausea and vomiting that prevents you from eating or drinking to stay hydrated.  °3. If you have a stent, you will likely urinate more frequently and urgently until the stent is removed and you may experience some discomfort/pain in the lower abdomen and flank especially when urinating. You may take pain medication prescribed to you if needed for pain. You may also intermittently have blood in the urine until the stent is removed. ° ° °General Anesthesia, Adult, Care After °This sheet gives you information about how to care for yourself after your procedure. Your health care provider may also give you more specific instructions. If you have problems or questions, contact your health care provider. °What can I expect after the procedure? °After the procedure, the following side effects are common: °· Pain or discomfort at the IV site. °· Nausea. °· Vomiting. °· Sore throat. °· Trouble concentrating. °· Feeling cold or chills. °· Weak or tired. °· Sleepiness and fatigue. °· Soreness and body aches. These side effects can affect parts of the body that were not involved in surgery. °Follow these instructions at home: ° °For at least 24 hours after the procedure: °· Have a responsible adult stay with you. It is important to have someone help care for you until you are awake and alert. °· Rest as needed. °· Do not: °? Participate in activities in which you could fall or become injured. °? Drive. °? Use heavy machinery. °? Drink alcohol. °? Take sleeping pills or medicines that cause drowsiness. °? Make important decisions or sign legal documents. °? Take care of children on your own. °Eating and drinking °· Follow any instructions  from your health care provider about eating or drinking restrictions. °· When you feel hungry, start by eating small amounts of foods that are soft and easy to digest (bland), such as toast. Gradually return to your regular diet. °· Drink enough fluid to keep your urine pale yellow. °· If you vomit, rehydrate by drinking water, juice, or clear broth. °General instructions °· If you have sleep apnea, surgery and certain medicines can increase your risk for breathing problems. Follow instructions from your health care provider about wearing your sleep device: °? Anytime you are sleeping, including during daytime naps. °? While taking prescription pain medicines, sleeping medicines, or medicines that make you drowsy. °· Return to your normal activities as told by your health care provider. Ask your health care provider what activities are safe for you. °· Take over-the-counter and prescription medicines only as told by your health care provider. °· If you smoke, do not smoke without supervision. °· Keep all follow-up visits as told by your health care provider. This is important. °Contact a health care provider if: °· You have nausea or vomiting that does not get better with medicine. °· You cannot eat or drink without vomiting. °· You have pain that does not get better with medicine. °· You are unable to pass urine. °· You develop a skin rash. °· You have a fever. °· You have redness around your IV site that gets worse. °Get help right away if: °· You have difficulty breathing. °· You have chest pain. °· You have blood in   your urine or stool, or you vomit blood. Summary  After the procedure, it is common to have a sore throat or nausea. It is also common to feel tired.  Have a responsible adult stay with you for the first 24 hours after general anesthesia. It is important to have someone help care for you until you are awake and alert.  When you feel hungry, start by eating small amounts of foods that are soft  and easy to digest (bland), such as toast. Gradually return to your regular diet.  Drink enough fluid to keep your urine pale yellow.  Return to your normal activities as told by your health care provider. Ask your health care provider what activities are safe for you. This information is not intended to replace advice given to you by your health care provider. Make sure you discuss any questions you have with your health care provider. Document Released: 05/22/2000 Document Revised: 02/16/2017 Document Reviewed: 09/29/2016 Elsevier Patient Education  2020 [employer].

## 2018-12-10 ENCOUNTER — Encounter (HOSPITAL_COMMUNITY): Payer: Self-pay | Admitting: Urology

## 2019-06-03 ENCOUNTER — Other Ambulatory Visit: Payer: Self-pay | Admitting: Urology

## 2019-06-03 ENCOUNTER — Other Ambulatory Visit (HOSPITAL_COMMUNITY): Payer: Self-pay | Admitting: Urology

## 2019-06-09 ENCOUNTER — Other Ambulatory Visit (HOSPITAL_COMMUNITY): Payer: Self-pay | Admitting: Urology

## 2019-06-09 DIAGNOSIS — C651 Malignant neoplasm of right renal pelvis: Secondary | ICD-10-CM

## 2019-06-12 ENCOUNTER — Other Ambulatory Visit (HOSPITAL_COMMUNITY): Payer: Self-pay | Admitting: Urology

## 2019-06-12 DIAGNOSIS — C651 Malignant neoplasm of right renal pelvis: Secondary | ICD-10-CM

## 2019-06-24 ENCOUNTER — Ambulatory Visit (HOSPITAL_COMMUNITY)
Admission: RE | Admit: 2019-06-24 | Discharge: 2019-06-24 | Disposition: A | Discharge status: Home / Self Care | Payer: Medicare Other | Source: Ambulatory Visit | Attending: Urology | Admitting: Urology

## 2019-06-24 ENCOUNTER — Other Ambulatory Visit (HOSPITAL_COMMUNITY): Payer: Self-pay | Admitting: Urology

## 2019-06-24 ENCOUNTER — Encounter (HOSPITAL_COMMUNITY): Payer: Self-pay

## 2019-06-24 ENCOUNTER — Other Ambulatory Visit: Payer: Self-pay

## 2019-06-24 DIAGNOSIS — C651 Malignant neoplasm of right renal pelvis: Secondary | ICD-10-CM

## 2019-06-24 MED ORDER — GADOBUTROL 1 MMOL/ML IV SOLN
6.0000 mL | Freq: Once | INTRAVENOUS | Status: AC | PRN
  Administered 2019-06-24: 16:00:00 6 mL via INTRAVENOUS

## 2019-10-07 ENCOUNTER — Other Ambulatory Visit: Payer: Self-pay | Admitting: Urology

## 2019-10-13 NOTE — Progress Notes (Signed)
DUE TO COVID-19 ONLY ONE VISITOR IS ALLOWED TO COME WITH YOU AND STAY IN THE WAITING ROOM ONLY DURING PRE OP AND PROCEDURE DAY OF SURGERY. THE 1 VISITOR  MAY VISIT WITH YOU AFTER SURGERY IN YOUR PRIVATE ROOM DURING VISITING HOURS ONLY!  YOU NEED TO HAVE A COVID 19 TEST ON_______ @_______ , THIS TEST MUST BE DONE BEFORE SURGERY,  COVID TESTING SITE [address] [place] [address], IT IS ON THE RIGHT GOING OUT [address] APPROXIMATELY  2 MINUTES PAST [employer] ON THE RIGHT. ONCE YOUR COVID TEST IS COMPLETED,  PLEASE BEGIN THE QUARANTINE INSTRUCTIONS AS OUTLINED IN YOUR HANDOUT.                Brandy Kabat.  10/13/2019   Your procedure is scheduled on:                10/16/2019   Report to [facility] Main  Entrance   Report to admitting a   0830t AM     Call this number if you have problems the morning of surgery [phone]    Remember: Do not eat food or drink liquids :After Midnight. BRUSH YOUR TEETH MORNING OF SURGERY AND RINSE YOUR MOUTH OUT, NO CHEWING GUM CANDY OR MINTS.     Take these medicines the morning of surgery with A SIP OF WATER:  Pyridium if needed  DO NOT TAKE ANY DIABETIC MEDICATIONS DAY OF YOUR SURGERY                               You may not have any metal on your body including hair pins and              piercings  Do not wear jewelry, make-up, lotions, powders or perfumes, deodorant             Do not wear nail polish on your fingernails.  Do not shave  48 hours prior to surgery.              Men may shave face and neck.   Do not bring valuables to the hospital. [place].  Contacts, dentures or bridgework may not be worn into surgery.  Leave suitcase in the car. After surgery it may be brought to your room.     Patients discharged the day of surgery will not be allowed to drive home. IF YOU ARE HAVING SURGERY AND GOING HOME THE SAME DAY, YOU MUST HAVE AN ADULT TO DRIVE  YOU HOME AND BE WITH YOU FOR 24 HOURS. YOU MAY GO HOME BY TAXI OR UBER OR ORTHERWISE, BUT AN ADULT MUST ACCOMPANY YOU HOME AND STAY WITH YOU FOR 24 HOURS.  Name and phone number of your driver:  Special Instructions: N/A              Please read over the following fact sheets you were given: _____________________________________________________________________             [facility] - Preparing for Surgery Before surgery, you can play an important role.  Because skin is not sterile, your skin needs to be as free of germs as possible.  You can reduce the number of germs on your skin by washing with CHG (chlorahexidine gluconate) soap before surgery.  CHG is an antiseptic cleaner which  kills germs and bonds with the skin to continue killing germs even after washing. Please DO NOT use if you have an allergy to CHG or antibacterial soaps.  If your skin becomes reddened/irritated stop using the CHG and inform your nurse when you arrive at Short Stay. Do not shave (including legs and underarms) for at least 48 hours prior to the first CHG shower.  You may shave your face/neck. Please follow these instructions carefully:  1.  Shower with CHG Soap the night before surgery and the  morning of Surgery.  2.  If you choose to wash your hair, wash your hair first as usual with your  normal  shampoo.  3.  After you shampoo, rinse your hair and body thoroughly to remove the  shampoo.                           4.  Use CHG as you would any other liquid soap.  You can apply chg directly  to the skin and wash                       Gently with a scrungie or clean washcloth.  5.  Apply the CHG Soap to your body ONLY FROM THE NECK DOWN.   Do not use on face/ open                           Wound or open sores. Avoid contact with eyes, ears mouth and genitals (private parts).                       Wash face,  Genitals (private parts) with your normal soap.             6.  Wash thoroughly, paying special attention to  the area where your surgery  will be performed.  7.  Thoroughly rinse your body with warm water from the neck down.  8.  DO NOT shower/wash with your normal soap after using and rinsing off  the CHG Soap.                9.  Pat yourself dry with a clean towel.            10.  Wear clean pajamas.            11.  Place clean sheets on your bed the night of your first shower and do not  sleep with pets. Day of Surgery : Do not apply any lotions/deodorants the morning of surgery.  Please wear clean clothes to the hospital/surgery center.  FAILURE TO FOLLOW THESE INSTRUCTIONS MAY RESULT IN THE CANCELLATION OF YOUR SURGERY PATIENT SIGNATURE_________________________________  NURSE SIGNATURE__________________________________  ________________________________________________________________________

## 2019-10-14 ENCOUNTER — Other Ambulatory Visit (HOSPITAL_COMMUNITY)
Admission: RE | Admit: 2019-10-14 | Discharge: 2019-10-14 | Disposition: A | Discharge status: Home / Self Care | Payer: Medicare Other | Source: Ambulatory Visit | Attending: Urology | Admitting: Urology

## 2019-10-14 ENCOUNTER — Other Ambulatory Visit: Payer: Self-pay

## 2019-10-14 ENCOUNTER — Encounter (HOSPITAL_COMMUNITY): Payer: Self-pay

## 2019-10-14 ENCOUNTER — Encounter (HOSPITAL_COMMUNITY)
Admission: RE | Admit: 2019-10-14 | Discharge: 2019-10-14 | Disposition: A | Discharge status: Home / Self Care | Payer: Medicare Other | Source: Ambulatory Visit | Attending: Urology | Admitting: Urology

## 2019-10-14 DIAGNOSIS — Z01818 Encounter for other preprocedural examination: Secondary | ICD-10-CM | POA: Diagnosis present

## 2019-10-14 DIAGNOSIS — Z20822 Contact with and (suspected) exposure to covid-19: Secondary | ICD-10-CM | POA: Insufficient documentation

## 2019-10-14 HISTORY — DX: Dyspnea, unspecified: R06.00

## 2019-10-14 LAB — CBC
HCT: 32 % — ABNORMAL LOW (ref 39.0–52.0)
Hemoglobin: 10.4 g/dL — ABNORMAL LOW (ref 13.0–17.0)
MCH: 29.6 pg (ref 26.0–34.0)
MCHC: 32.5 g/dL (ref 30.0–36.0)
MCV: 91.2 fL (ref 80.0–100.0)
Platelets: 159 10*3/uL (ref 150–400)
RBC: 3.51 MIL/uL — ABNORMAL LOW (ref 4.22–5.81)
RDW: 13.7 % (ref 11.5–15.5)
WBC: 5.4 10*3/uL (ref 4.0–10.5)
nRBC: 0 % (ref 0.0–0.2)

## 2019-10-14 LAB — BASIC METABOLIC PANEL
Anion gap: 9 (ref 5–15)
BUN: 25 mg/dL — ABNORMAL HIGH (ref 8–23)
CO2: 22 mmol/L (ref 22–32)
Calcium: 9.2 mg/dL (ref 8.9–10.3)
Chloride: 109 mmol/L (ref 98–111)
Creatinine, Ser: 1.92 mg/dL — ABNORMAL HIGH (ref 0.61–1.24)
GFR calc Af Amer: 36 mL/min — ABNORMAL LOW (ref >60)
GFR calc non Af Amer: 31 mL/min — ABNORMAL LOW (ref >60)
Glucose, Bld: 97 mg/dL (ref 70–99)
Potassium: 4.6 mmol/L (ref 3.5–5.1)
Sodium: 140 mmol/L (ref 135–145)

## 2019-10-14 LAB — SARS CORONAVIRUS 2 (TAT 6-24 HRS): SARS Coronavirus 2: NEGATIVE

## 2019-10-14 NOTE — Progress Notes (Addendum)
Anesthesia Review:  PCP: does not have one per pt   Cardiologist : none   Chest x-ray : EKG :10/14/2019  Echo : Stress test: Cardiac Cath :  Activity level: uses cane at home Cannot do a flight of steps without difficulty  Sleep Study/ CPAP : no  Fasting Blood Sugar :      / Checks Blood Sugar -- times a day:   Blood Thinner/ Instructions /Last Dose: ASA / Instructions/ Last Dose :  On no meds  Hx of CABG 2009 per hx  Last cysto 11/2018

## 2019-10-14 NOTE — Progress Notes (Signed)
Cbc AND bmp DONE 10/14/19 ROUTED VIA Epic TO dr Alinda Money

## 2019-10-15 NOTE — H&P (Signed)
Office Visit Report     10/01/2019   --------------------------------------------------------------------------------   Wesley Snyder  MRN: [id number]  DOB: [birth year], 86 year old Male  SSN: [id number]   PRIMARY CARE:  Raynelle Bring, Brooke Bonito, M  REFERRING:  Aloha Gell. Hurt, MD  PROVIDER:  Raynelle Bring, M.D.  LOCATION:  [facility] [phone]     --------------------------------------------------------------------------------   CC/HPI: 1. Urothelial carcinoma of the right renal pelvis and bladder  2. Prostate cancer   [name] follows up today after having been last seen in May. At that time, he did have some concern for possible small tumor growth in the right renal collecting system. However, his daughter had been ill and hospitalized and he did not wish to proceed with any other medical procedures at that time. He presents today for further evaluation. He has continued to have hematuria intermittently. He was treated for a probable urinary tract infection earlier this summer. Currently, he has been voiding clearly. He continues to have severe incontinence. He also follows up for further evaluation of his elevated PSA which was last checked 1 year ago.     ALLERGIES: None    MEDICATIONS: No Medications    GU PSH: Bladder Instill AntiCA Agent - 01/16/2019, 01/09/2019, 01/02/2019, 10/31/2018, 09/09/2018, 09/02/2018, 08/26/2018, 08/12/2018, 08/05/2018, 07/29/2018, 06/03/2018, 05/27/2018, 05/20/2018, 05/13/2018, 05/06/2018, 04/29/2018 Catheterization For Collection Of Specimen, Single Patient, All Places Of Service - 01/28/2018 Cysto Remove Stent FB Sim - 12/17/2018, 11/13/2018, 01/08/2018, 2018 Cysto Uretero Biopsy Fulgura, Right - 12/09/2018, Right - 12/24/2017, Right - 2018 Cysto Uretero W/excise Tumor, Right - 10/31/2018, Right - 2018, Right - 2018 Cystoscopy - 07/09/2019, 04/09/2019, 03/12/2019, 10/18/2018, 07/12/2018, 2020, 11/23/2017 Cystoscopy Fulguration - 01/31/2018 Cystoscopy Insert Stent,  Right - 12/09/2018, Right - 10/31/2018, Right - 10/31/2018, Right - 12/24/2017, Right - 2019, Right - 2018, Right - 2018, Right - 2018 Cystoscopy TURBT <2 cm - 10/31/2018 Cystoscopy TURBT >5 cm - 2020 Cystoscopy TURBT 2-5 cm - 02/11/2018, Right - 12/24/2017 Cystoscopy Ureteroscopy, Right - 2019 Locm 300-399Mg /Ml Iodine,1Ml - 10/15/2018, 11/19/2017, 2019 Radical Prostatectomy     NON-GU PSH: Cataract surgery, Bilateral - 11/08/2017 Coronary Artery Bypass Grafting     GU PMH: Bladder Cancer overlapping sites - 01/08/2018 Gross hematuria - 12/10/2017, - 11/08/2017, - 2018 Renal pelvis cancer, right - 2018 History of kidney cancer - 2018 Prostate Cancer - 2018 Right renal neoplasm - 2018      PMH Notes:   1) Renal cell carcinoma: He is s/p an open left radical nephrectomy at [facility] in 1994.   2) Prostate cancer: He is s/p an open RRP at [facility] in 1994. He apparently had a biochemical recurrence soon after primary therapy and has been on systemic therapy with ADT since then under his urologist in [place].   3) Urothelial carcinoma of the right kidney: He was found to have a right renal collecting system tumor on imaging in 2018 in his solitary right kidney. This was confirmed on ureteroscopic evaluation to be high grade urothelial carcinoma.   Aug 2018: Right ureteroscopy with laser ablation of right renal pelvic tumor - pathology indicated high-grade urothelial carcinoma  Sep 2018: Repeat right ureteroscopy with laser ablation of remaining right renal pelvic tumor - pathology confirmed high-grade urothelial carcinoma with a suspicious focus of lamina propria invasion  Nov 2018: Repeat right ureteroscopy - Minimal tumor remaining and completely laser ablated  May 2019: Cystoscopy with surveillance right ureteroscopy - Negative  Oct 2019: He develop recurrent  hematuria, right ureteroscopy with laser ablation of small tumor in lower pole and TURBT of bladder tumor - High grade  T1a urothelial carcinoma  Dec 2019: Recurrent hematuria and recurrent tumor  Dec 2019: TURBT - Multifocal high grade, T1 urothelial carcinoma  Jan 2020: TURBT - Recurrent multifocal high grade, T1 urothelial carcinoma, post-op intravesical gemcitabine  Feb-Apr 2020: 6 week induction BCG  May 2020: Positive cytology (no visible tumors)  Jun-Jul 2020: Repeat 6 week induction BCG  Sep 2020: Right ureteroscopic laser ablation of 1.5 cm lower pole tumor, TURBT small left 1 cm bladder tumor- high grade Ta  Oct 2020: Right ureteroscopy with completion ablation of right renal pelvic tumor  Nov 2020: 3 week maintenance BCG   4) Chronic bacteruria:      NON-GU PMH: Bacteriuria (Stable) - 12/17/2018, (Stable), - 11/13/2018, - 2018 Pyuria/other UA findings - 04/10/2018 Coronary Artery Disease Sleep Apnea    FAMILY HISTORY: 1 Daughter - Runs in Family 2 sons - Runs in Family Cirrhosis - Father Death - Mother, Father   SOCIAL HISTORY: Marital Status: Married Preferred Language: English; Ethnicity: Not Hispanic Or Latino; Race: White Current Smoking Status: Patient has never smoked.   Tobacco Use Assessment Completed: Used Tobacco in last 30 days? Has never drank.  Drinks 1 caffeinated drink per day.    REVIEW OF SYSTEMS:    GU Review Male:   Patient denies frequent urination, hard to postpone urination, burning/ pain with urination, get up at night to urinate, leakage of urine, stream starts and stops, trouble starting your streams, and have to strain to urinate .  Gastrointestinal (Lower):   Patient denies diarrhea and constipation.  Gastrointestinal (Upper):   Patient denies nausea and vomiting.  Constitutional:   Patient denies fever, night sweats, weight loss, and fatigue.  Skin:   Patient denies skin rash/ lesion and itching.  Eyes:   Patient denies blurred vision and double vision.  Ears/ Nose/ Throat:   Patient denies sinus problems and sore throat.  Hematologic/Lymphatic:   Patient  denies swollen glands and easy bruising.  Cardiovascular:   Patient denies leg swelling and chest pains.  Respiratory:   Patient denies cough and shortness of breath.  Endocrine:   Patient denies excessive thirst.  Musculoskeletal:   Patient denies back pain and joint pain.  Neurological:   Patient denies headaches and dizziness.  Psychologic:   Patient denies depression and anxiety.   VITAL SIGNS:      10/01/2019 10:27 AM  Weight 150 lb / 68.04 kg  Height 70 in / 177.8 cm  BP 135/74 mmHg  Pulse 56 /min  Temperature 97.1 F / 36.1 C  BMI 21.5 kg/m   GU PHYSICAL EXAMINATION:    Urethral Meatus: Normal size. No lesion, no wart, no discharge, no polyp. Normal location.   MULTI-SYSTEM PHYSICAL EXAMINATION:    Constitutional: Well-nourished. No physical deformities. Normally developed. Good grooming.  Respiratory: No labored breathing, no use of accessory muscles. Clear bilaterally.  Cardiovascular: Normal temperature, normal extremity pulses, no swelling, no varicosities. Regular rate and rhythm.     Complexity of Data:  Records Review:   Previous Patient Records   10/17/18 09/27/16  PSA  Total PSA <0.015 ng/mL <0.015 ng/dL    PROCEDURES:         Flexible Cystoscopy - 52000  Indication: Urothelial carcinoma Risks, benefits, and potential complications of the procedure were discussed with the patient including infection, bleeding, voiding discomfort, urinary retention, fever, chills, sepsis, and others. All questions  were answered. Informed consent was obtained. Sterile technique and intraurethral analgesia were used.  Meatus:  Normal size. Normal location. Normal condition.  Urethra:  No strictures.  External Sphincter:  Normal.  Verumontanum:  Verumontanum Surgically Absent.  Prostate:  Prostate Surgically Absent.  Bladder Neck:  Non-obstructing.  Ureteral Orifices:  Normal location. Normal size. Normal shape. Effluxed clear urine.  Bladder:  Visualization of bladder was poor  due to hematuria. A bladder washing was obtained for cytology. No clear tumors were identified.      Chaperone: Precious Bard R The procedure was well-tolerated and without complications. Instructions were given to call the office immediately if questions or problems.         Urinalysis w/Scope Dipstick Dipstick Cont'd Micro  Color: Red Bilirubin: Invalid mg/dL WBC/hpf: 6 - 10/hpf  Appearance: Turbid Ketones: Invalid mg/dL RBC/hpf: Packed/hpf  Specific Gravity: Invalid Blood: Invalid ery/uL Bacteria: Mod (26-50/hpf)  pH: Invalid Protein: Invalid mg/dL Cystals: NS (Not Seen)  Glucose: Invalid mg/dL Urobilinogen: Invalid mg/dL Casts: NS (Not Seen)    Nitrites: Invalid Trichomonas: Not Present    Leukocyte Esterase: Invalid leu/uL Mucous: Not Present      Epithelial Cells: 0 - 5/hpf      Yeast: NS (Not Seen)      Sperm: Not Present    Notes: Results invalid due to color interference. Microscopic not concentrated.    ASSESSMENT:      ICD-10 Details  1 GU:   Bladder Cancer overlapping sites - C67.8   2   Prostate Cancer - C61   3   Renal pelvis cancer, right - C65.1   4 NON-GU:   Bacteriuria - R82.71    PLAN:           Orders Labs BMP, PSA, Urine Culture, Urine Cytology          Schedule Return Visit/Planned Activity: Other See Visit Notes             Note: Will call to schedule surgery.          Document Letter(s):  Created for Patient: Clinical Summary         Notes:   1. Urothelial carcinoma of the right renal pelvis and bladder: I have recommended that he proceed with further endoscopic evaluation and possible transurethral resection and/or laser ablation of any tumor recurrence. He will therefore be scheduled for cystoscopy with possible transurethral resection of bladder tumor, right retrograde pyelography, right ureteroscopy, and possible laser ablation of any right renal or ureteral tumors and right ureteral stent placement. We have reviewed the potential risks and  complications of this procedure and he gives informed consent. His urine will be cultured today we will treat him with appropriate preoperative antibiotics.   2. Prostate cancer: His PSA will be checked today.   Cc:    * Signed by Raynelle Bring, M.D. on 10/01/19 at 7:39 PM (EDT)*

## 2019-10-16 ENCOUNTER — Encounter (HOSPITAL_COMMUNITY): Payer: Self-pay | Admitting: Urology

## 2019-10-16 ENCOUNTER — Ambulatory Visit (HOSPITAL_COMMUNITY)
Admission: RE | Admit: 2019-10-16 | Discharge: 2019-10-16 | Disposition: A | Discharge status: Home / Self Care | Payer: Medicare Other | Attending: Urology | Admitting: Urology

## 2019-10-16 ENCOUNTER — Other Ambulatory Visit: Payer: Self-pay

## 2019-10-16 ENCOUNTER — Ambulatory Visit (HOSPITAL_COMMUNITY): Payer: Medicare Other | Admitting: Anesthesiology

## 2019-10-16 ENCOUNTER — Ambulatory Visit (HOSPITAL_COMMUNITY): Payer: Medicare Other

## 2019-10-16 ENCOUNTER — Encounter (HOSPITAL_COMMUNITY)
Admission: RE | Disposition: A | Discharge status: Home / Self Care | Payer: Self-pay | Source: Home / Self Care | Attending: Urology

## 2019-10-16 ENCOUNTER — Ambulatory Visit (HOSPITAL_COMMUNITY): Payer: Medicare Other | Admitting: Physician Assistant

## 2019-10-16 DIAGNOSIS — I2510 Atherosclerotic heart disease of native coronary artery without angina pectoris: Secondary | ICD-10-CM | POA: Insufficient documentation

## 2019-10-16 DIAGNOSIS — C679 Malignant neoplasm of bladder, unspecified: Secondary | ICD-10-CM | POA: Insufficient documentation

## 2019-10-16 DIAGNOSIS — Q600 Renal agenesis, unilateral: Secondary | ICD-10-CM | POA: Diagnosis not present

## 2019-10-16 DIAGNOSIS — Z951 Presence of aortocoronary bypass graft: Secondary | ICD-10-CM | POA: Insufficient documentation

## 2019-10-16 DIAGNOSIS — M1990 Unspecified osteoarthritis, unspecified site: Secondary | ICD-10-CM | POA: Diagnosis not present

## 2019-10-16 DIAGNOSIS — Z8546 Personal history of malignant neoplasm of prostate: Secondary | ICD-10-CM | POA: Diagnosis not present

## 2019-10-16 DIAGNOSIS — Z8553 Personal history of malignant neoplasm of renal pelvis: Secondary | ICD-10-CM | POA: Insufficient documentation

## 2019-10-16 DIAGNOSIS — C651 Malignant neoplasm of right renal pelvis: Secondary | ICD-10-CM | POA: Diagnosis not present

## 2019-10-16 DIAGNOSIS — G4730 Sleep apnea, unspecified: Secondary | ICD-10-CM | POA: Diagnosis not present

## 2019-10-16 DIAGNOSIS — I10 Essential (primary) hypertension: Secondary | ICD-10-CM | POA: Insufficient documentation

## 2019-10-16 DIAGNOSIS — Z905 Acquired absence of kidney: Secondary | ICD-10-CM | POA: Insufficient documentation

## 2019-10-16 HISTORY — PX: CYSTOSCOPY WITH RETROGRADE PYELOGRAM, URETEROSCOPY AND STENT PLACEMENT: SHX5789

## 2019-10-16 HISTORY — PX: HOLMIUM LASER APPLICATION: SHX5852

## 2019-10-16 SURGERY — CYSTOURETEROSCOPY, WITH RETROGRADE PYELOGRAM AND STENT INSERTION
Anesthesia: General | Site: Ureter | Laterality: Right

## 2019-10-16 MED ORDER — FENTANYL CITRATE (PF) 100 MCG/2ML IJ SOLN
INTRAMUSCULAR | Status: DC | PRN
  Administered 2019-10-16 (×2): 50 ug via INTRAVENOUS

## 2019-10-16 MED ORDER — PROMETHAZINE HCL 25 MG/ML IJ SOLN: 6.2500 mg | INTRAMUSCULAR | Status: DC | PRN

## 2019-10-16 MED ORDER — ROCURONIUM BROMIDE 10 MG/ML (PF) SYRINGE
PREFILLED_SYRINGE | INTRAVENOUS | Status: AC
  Filled 2019-10-16: qty 10

## 2019-10-16 MED ORDER — DEXAMETHASONE SODIUM PHOSPHATE 10 MG/ML IJ SOLN
INTRAMUSCULAR | Status: DC | PRN
  Administered 2019-10-16: 10 mg via INTRAVENOUS

## 2019-10-16 MED ORDER — LIDOCAINE 2% (20 MG/ML) 5 ML SYRINGE
INTRAMUSCULAR | Status: DC | PRN
  Administered 2019-10-16: 100 mg via INTRAVENOUS

## 2019-10-16 MED ORDER — SODIUM CHLORIDE 0.9 % IR SOLN
Status: DC | PRN
  Administered 2019-10-16: 6000 mL

## 2019-10-16 MED ORDER — EPHEDRINE 5 MG/ML INJ
INTRAVENOUS | Status: AC
  Filled 2019-10-16: qty 10

## 2019-10-16 MED ORDER — ROCURONIUM BROMIDE 10 MG/ML (PF) SYRINGE
PREFILLED_SYRINGE | INTRAVENOUS | Status: DC | PRN
  Administered 2019-10-16: 60 mg via INTRAVENOUS

## 2019-10-16 MED ORDER — CELECOXIB 200 MG PO CAPS
200.0000 mg | ORAL_CAPSULE | Freq: Once | ORAL | Status: AC
  Administered 2019-10-16: 200 mg via ORAL
  Filled 2019-10-16: qty 1

## 2019-10-16 MED ORDER — SODIUM CHLORIDE 0.9 % IR SOLN
Status: DC | PRN
  Administered 2019-10-16: 1000 mL

## 2019-10-16 MED ORDER — LACTATED RINGERS IV SOLN: INTRAVENOUS | Status: DC

## 2019-10-16 MED ORDER — PROPOFOL 10 MG/ML IV BOLUS
INTRAVENOUS | Status: AC
  Filled 2019-10-16: qty 20

## 2019-10-16 MED ORDER — ONDANSETRON HCL 4 MG/2ML IJ SOLN
INTRAMUSCULAR | Status: DC | PRN
  Administered 2019-10-16: 4 mg via INTRAVENOUS

## 2019-10-16 MED ORDER — CHLORHEXIDINE GLUCONATE 0.12 % MT SOLN
15.0000 mL | Freq: Once | OROMUCOSAL | Status: AC
  Administered 2019-10-16: 15 mL via OROMUCOSAL

## 2019-10-16 MED ORDER — EPHEDRINE SULFATE-NACL 50-0.9 MG/10ML-% IV SOSY
PREFILLED_SYRINGE | INTRAVENOUS | Status: DC | PRN
  Administered 2019-10-16: 5 mg via INTRAVENOUS
  Administered 2019-10-16: 10 mg via INTRAVENOUS
  Administered 2019-10-16: 5 mg via INTRAVENOUS

## 2019-10-16 MED ORDER — ACETAMINOPHEN 500 MG PO TABS
1000.0000 mg | ORAL_TABLET | Freq: Once | ORAL | Status: AC
  Administered 2019-10-16: 1000 mg via ORAL
  Filled 2019-10-16: qty 2

## 2019-10-16 MED ORDER — PHENYLEPHRINE 40 MCG/ML (10ML) SYRINGE FOR IV PUSH (FOR BLOOD PRESSURE SUPPORT)
PREFILLED_SYRINGE | INTRAVENOUS | Status: AC
  Filled 2019-10-16: qty 10

## 2019-10-16 MED ORDER — ORAL CARE MOUTH RINSE: 15.0000 mL | Freq: Once | OROMUCOSAL | Status: AC

## 2019-10-16 MED ORDER — LIDOCAINE 2% (20 MG/ML) 5 ML SYRINGE
INTRAMUSCULAR | Status: AC
  Filled 2019-10-16: qty 5

## 2019-10-16 MED ORDER — SUCCINYLCHOLINE CHLORIDE 200 MG/10ML IV SOSY
PREFILLED_SYRINGE | INTRAVENOUS | Status: AC
  Filled 2019-10-16: qty 10

## 2019-10-16 MED ORDER — FENTANYL CITRATE (PF) 100 MCG/2ML IJ SOLN: 25.0000 ug | INTRAMUSCULAR | Status: DC | PRN

## 2019-10-16 MED ORDER — DEXAMETHASONE SODIUM PHOSPHATE 10 MG/ML IJ SOLN
INTRAMUSCULAR | Status: AC
  Filled 2019-10-16: qty 1

## 2019-10-16 MED ORDER — PHENYLEPHRINE 40 MCG/ML (10ML) SYRINGE FOR IV PUSH (FOR BLOOD PRESSURE SUPPORT)
PREFILLED_SYRINGE | INTRAVENOUS | Status: DC | PRN
  Administered 2019-10-16 (×3): 80 ug via INTRAVENOUS

## 2019-10-16 MED ORDER — FENTANYL CITRATE (PF) 100 MCG/2ML IJ SOLN
INTRAMUSCULAR | Status: AC
Start: 2019-10-16 — End: ?
  Filled 2019-10-16: qty 2

## 2019-10-16 MED ORDER — PROPOFOL 10 MG/ML IV BOLUS
INTRAVENOUS | Status: DC | PRN
  Administered 2019-10-16: 150 mg via INTRAVENOUS

## 2019-10-16 MED ORDER — SUGAMMADEX SODIUM 200 MG/2ML IV SOLN
INTRAVENOUS | Status: DC | PRN
  Administered 2019-10-16: 200 mg via INTRAVENOUS

## 2019-10-16 MED ORDER — SODIUM CHLORIDE 0.9 % IV SOLN
2.0000 g | INTRAVENOUS | Status: AC
  Administered 2019-10-16: 2 g via INTRAVENOUS
  Filled 2019-10-16: qty 20

## 2019-10-16 MED ORDER — ONDANSETRON HCL 4 MG/2ML IJ SOLN
INTRAMUSCULAR | Status: AC
  Filled 2019-10-16: qty 2

## 2019-10-16 SURGICAL SUPPLY — 29 items
BAG DRN RND TRDRP ANRFLXCHMBR (UROLOGICAL SUPPLIES)
BAG URINE DRAIN 2000ML AR STRL (UROLOGICAL SUPPLIES) IMPLANT
BAG URO CATCHER STRL LF (MISCELLANEOUS) ×5 IMPLANT
BASKET ZERO TIP NITINOL 2.4FR (BASKET) IMPLANT
BSKT STON RTRVL ZERO TP 2.4FR (BASKET)
CATH INTERMIT  6FR 70CM (CATHETERS) ×4 IMPLANT
CLOTH BEACON ORANGE TIMEOUT ST (SAFETY) ×5 IMPLANT
ELECT REM PT RETURN 15FT ADLT (MISCELLANEOUS) ×5 IMPLANT
GLOVE BIOGEL M STRL SZ7.5 (GLOVE) ×5 IMPLANT
GOWN STRL REUS W/TWL LRG LVL3 (GOWN DISPOSABLE) ×5 IMPLANT
GUIDEWIRE STR DUAL SENSOR (WIRE) ×5 IMPLANT
GUIDEWIRE ZIPWRE .038 STRAIGHT (WIRE) ×4 IMPLANT
IV NS 1000ML (IV SOLUTION) ×5
IV NS 1000ML BAXH (IV SOLUTION) ×3 IMPLANT
KIT TURNOVER KIT A (KITS) IMPLANT
LASER FIB FLEXIVA PULSE ID 365 (Laser) IMPLANT
LOOP CUT BIPOLAR 24F LRG (ELECTROSURGICAL) IMPLANT
MANIFOLD NEPTUNE II (INSTRUMENTS) ×5 IMPLANT
PACK CYSTO (CUSTOM PROCEDURE TRAY) ×5 IMPLANT
PENCIL SMOKE EVACUATOR (MISCELLANEOUS) IMPLANT
SHEATH URETERAL 12FRX35CM (MISCELLANEOUS) ×4 IMPLANT
STENT URET 6FRX26 CONTOUR (STENTS) ×3 IMPLANT
SYR TOOMEY IRRIG 70ML (MISCELLANEOUS)
SYRINGE TOOMEY IRRIG 70ML (MISCELLANEOUS) IMPLANT
TRACTIP FLEXIVA PULS ID 200XHI (Laser) IMPLANT
TRACTIP FLEXIVA PULSE ID 200 (Laser)
TUBING CONNECTING 10 (TUBING) ×4 IMPLANT
TUBING CONNECTING 10' (TUBING) ×1
TUBING UROLOGY SET (TUBING) ×5 IMPLANT

## 2019-10-16 NOTE — Anesthesia Procedure Notes (Signed)
Procedure Name: Intubation Date/Time: 10/16/2019 10:52 AM Performed by: Silas Sacramento, CRNA Pre-anesthesia Checklist: Patient identified, Emergency Drugs available, Suction available and Patient being monitored Patient Re-evaluated:Patient Re-evaluated prior to induction Oxygen Delivery Method: Circle system utilized Preoxygenation: Pre-oxygenation with 100% oxygen Induction Type: IV induction Ventilation: Mask ventilation without difficulty Laryngoscope Size: Mac and 4 Grade View: Grade I Tube type: Oral Tube size: 7.5 mm Number of attempts: 1 Airway Equipment and Method: Stylet Placement Confirmation: ETT inserted through vocal cords under direct vision,  positive ETCO2 and breath sounds checked- equal and bilateral Secured at: 23 cm Tube secured with: Tape Dental Injury: Teeth and Oropharynx as per pre-operative assessment

## 2019-10-16 NOTE — Interval H&P Note (Signed)
History and Physical Interval Note:  10/16/2019 10:24 AM  Wesley Snyder.  has presented today for surgery, with the diagnosis of RIGHT RENAL PELVIS AND BLADDER CANCER.  The various methods of treatment have been discussed with the patient and family. After consideration of risks, benefits and other options for treatment, the patient has consented to  Procedure(s): CYSTOSCOPY WITH RIGHT RETROGRADE PYELOGRAM, URETEROSCOPY AND STENT PLACEMENT (Right) POSSIBLE TRANSURETHRAL RESECTION OF BLADDER TUMOR (TURBT) POSSIBLE LASER ABLATION OF TUMOR (N/A) as a surgical intervention.  The patient's history has been reviewed, patient examined, no change in status, stable for surgery.  I have reviewed the patient's chart and labs.  Questions were answered to the patient's satisfaction.     [employer]

## 2019-10-16 NOTE — Transfer of Care (Signed)
Immediate Anesthesia Transfer of Care Note  Patient: Wesley Snyder.  Procedure(s) Performed: CYSTOSCOPY WITH RIGHT RETROGRADE PYELOGRAM, URETEROSCOPY AND STENT PLACEMENT (Right Ureter) HOLMIUM LASER APPLICATION laser ablation right renal pelvic tumor and biopsy  Patient Location: PACU  Anesthesia Type:General  Level of Consciousness: awake, oriented, patient cooperative and responds to stimulation  Airway & Oxygen Therapy: Patient Spontanous Breathing and Patient connected to face mask oxygen  Post-op Assessment: Report given to RN and Post -op Vital signs reviewed and stable  Post vital signs: Reviewed and stable  Last Vitals:  Vitals Value Taken Time  BP 136/75 10/16/19 1157  Temp    Pulse 67 10/16/19 1159  Resp 18 10/16/19 1159  SpO2 100 % 10/16/19 1159  Vitals shown include unvalidated device data.  Last Pain:  Vitals:   10/16/19 0849  TempSrc: Oral      Patients Stated Pain Goal: 3 (56/38/93 7342)  Complications: No complications documented.

## 2019-10-16 NOTE — Anesthesia Preprocedure Evaluation (Addendum)
Anesthesia Evaluation  Patient identified by MRN, date of birth, ID band Patient awake    Reviewed: Allergy & Precautions, NPO status , Patient's Chart, lab work & pertinent test results  History of Anesthesia Complications Negative for: history of anesthetic complications  Airway Mallampati: I  TM Distance: >3 FB Neck ROM: Full    Dental  (+) Poor Dentition, Dental Advisory Given   Pulmonary neg pulmonary ROS, former smoker,    Pulmonary exam normal        Cardiovascular hypertension, + CAD  Normal cardiovascular exam     Neuro/Psych negative neurological ROS  negative psych ROS   GI/Hepatic negative GI ROS, Neg liver ROS,   Endo/Other  negative endocrine ROS  Renal/GU negative Renal ROS  negative genitourinary   Musculoskeletal  (+) Arthritis ,   Abdominal   Peds  Hematology negative hematology ROS (+)   Anesthesia Other Findings   Reproductive/Obstetrics                            Anesthesia Physical  Anesthesia Plan  ASA: III  Anesthesia Plan: General   Post-op Pain Management:    Induction: Intravenous  PONV Risk Score and Plan: 2 and Ondansetron and Dexamethasone  Airway Management Planned: Oral ETT  Additional Equipment:   Intra-op Plan:   Post-operative Plan: Extubation in OR  Informed Consent: I have reviewed the patients History and Physical, chart, labs and discussed the procedure including the risks, benefits and alternatives for the proposed anesthesia with the patient or authorized representative who has indicated his/her understanding and acceptance.     Dental advisory given  Plan Discussed with: CRNA and Anesthesiologist  Anesthesia Plan Comments:       Anesthesia Quick Evaluation

## 2019-10-16 NOTE — Op Note (Signed)
Preoperative diagnosis: Urothelial carcinoma of the right renal pelvis and bladder  Postoperative diagnosis: Urothelial carcinoma of the right renal pelvis and bladder  Procedures: 1.  Cystoscopy 2.  Right retrograde pyelography with interpretation 3.  Right ureteroscopy with laser ablation of renal pelvic tumor 4.  Right ureteral stent placement (6 x 26-no string) 5.  Biopsy of right renal pelvic tumor  Surgeon: Pryor Curia MD  Anesthesia: General  Complications: None  EBL: Minimal  Specimens: 1.  Right renal pelvic tumor Disposition of specimen: Pathology  Intraoperative findings: Right retrograde pyelogram was performed with Omnipaque contrast and a 6 French ureteral catheter.  This revealed a normal caliber ureter without filling defects or other abnormalities.  The renal collecting system was not hydronephrotic.  There was a questionable filling defect in the lower pole calyx.  All other calyces appeared normal without filling defects.  Indication: [name] is an 86 year old gentleman with a right solitary kidney and chronic kidney disease.  He has a history of urothelial carcinoma of the right renal collecting system and bladder.  He recently had undergone imaging that suggested possible recurrent tumor in the lower pole of the right kidney.  He presents today to undergo further evaluation and possible laser ablation of his tumor.  The potential risks, complications, and the expected recovery process have been discussed in detail.  Informed consent was obtained.  Description of procedure: The patient was taken to the operating room and a general anesthetic was administered.  He was given preoperative antibiotics, placed in the dorsolithotomy position, and prepped and draped in usual sterile fashion.  Next, a preoperative timeout was performed.  Cystourethroscopy was then performed and the bladder was carefully examined with a 30 and 70 degree lens.  The urethra was normal.   Inspection of the bladder revealed a solitary right ureteral orifice in its expected anatomic location.  No bladder tumors, stones, or other mucosal pathology was identified other than mild trabeculation throughout the bladder.  Attention then turned to the right ureteral orifice.  This was cannulated with a 6 French ureteral catheter and Omnipaque contrast was injected.  Findings are as dictated above.  The ureter appeared normal.  A 0.38 sensor guidewire was then advanced up the ureter into the right renal collecting system.  A 12/14 ureteral access sheath was then advanced over the wire into the proximal ureter.  Using flexible digital ureteroscopy, the renal collecting system was then systematically examined.  There was noted to be tumor in the lower pole calyx.  This appeared to be mostly necrotic but with some potential viable aspects to it.  Utilizing a 200 m holmium laser fiber, this area of tumor was then laser ablated on a setting of 1 J and 6 Hz.  Once all viable tumor was ablated, a 0 tip nitinol basket was used to remove any tumor as possible.  The area was then inspected and appeared to be relatively hemostatic.  The guidewire was left in place and the ureteral access sheath was removed.  The guidewire was then backloaded on the cystoscope and a 6 x 26 double-J ureteral stent was advanced over the wire using Seldinger technique and positioned appropriately in the kidney as well as within the bladder.  The wire was removed.  The patient tolerated the procedure well.  The bladder was emptied.  He was transferred to the recovery unit in stable condition.

## 2019-10-16 NOTE — Discharge Instructions (Signed)

## 2019-10-16 NOTE — Anesthesia Postprocedure Evaluation (Signed)
Anesthesia Post Note  Patient: Wesley Snyder.  Procedure(s) Performed: CYSTOSCOPY WITH RIGHT RETROGRADE PYELOGRAM, URETEROSCOPY AND STENT PLACEMENT (Right Ureter) HOLMIUM LASER APPLICATION laser ablation right renal pelvic tumor and biopsy     Patient location during evaluation: PACU Anesthesia Type: General Level of consciousness: sedated Pain management: pain level controlled Vital Signs Assessment: post-procedure vital signs reviewed and stable Respiratory status: spontaneous breathing and respiratory function stable Cardiovascular status: stable Postop Assessment: no apparent nausea or vomiting Anesthetic complications: no   No complications documented.  Last Vitals:  Vitals:   10/16/19 1300 10/16/19 1315  BP: 129/65   Pulse: (!) 58   Resp: 16   Temp:  (!) 36.3 C  SpO2: 97%     Last Pain:  Vitals:   10/16/19 1315  TempSrc:   PainSc: 0-No pain                 [name] DANIEL

## 2019-10-17 ENCOUNTER — Encounter (HOSPITAL_COMMUNITY): Payer: Self-pay | Admitting: Urology

## 2019-10-21 LAB — SURGICAL PATHOLOGY

## 2019-10-27 ENCOUNTER — Encounter (HOSPITAL_COMMUNITY): Payer: Self-pay | Admitting: Urology

## 2019-11-05 ENCOUNTER — Other Ambulatory Visit: Payer: Self-pay | Admitting: Urology

## 2019-11-17 NOTE — Progress Notes (Signed)
DUE TO COVID-19 ONLY ONE VISITOR IS ALLOWED TO COME WITH YOU AND STAY IN THE WAITING ROOM ONLY DURING PRE OP AND PROCEDURE DAY OF SURGERY. THE 1 VISITOR  MAY VISIT WITH YOU AFTER SURGERY IN YOUR PRIVATE ROOM DURING VISITING HOURS ONLY!  YOU NEED TO HAVE A COVID 19 TEST ON___9/23/2021 ____ @_______ , THIS TEST MUST BE DONE BEFORE SURGERY,  COVID TESTING SITE [address] [place] [address], IT IS ON THE RIGHT GOING OUT [address] APPROXIMATELY  2 MINUTES PAST [employer] ON THE RIGHT. ONCE YOUR COVID TEST IS COMPLETED,  PLEASE BEGIN THE QUARANTINE INSTRUCTIONS AS OUTLINED IN YOUR HANDOUT.                Wesley Snyder.  11/17/2019   Your procedure is scheduled on:   11/24/19    Report to [facility] Main  Entrance   Report to admitting at    100pm     Call this number if you have problems the morning of surgery [phone]    Remember: Do not eat food , candy gum or mints :After Midnight. You may have clear liquids from midnight until 1200 noon    CLEAR LIQUID DIET   Foods Allowed                                                                       Coffee and tea, regular and decaf                              Plain Jell-O any favor except red or purple                                            Fruit ices (not with fruit pulp)                                      Iced Popsicles                                     Carbonated beverages, regular and diet                                    Cranberry, grape and apple juices Sports drinks like Gatorade Lightly seasoned clear broth or consume(fat free) Sugar, honey syrup   _____________________________________________________________________    BRUSH YOUR TEETH MORNING OF SURGERY AND RINSE YOUR MOUTH OUT, NO CHEWING GUM CANDY OR MINTS.     Take these medicines the morning of surgery with A SIP OF WATER: none                                  You may not have any metal on your body including  hair pins and  piercings  Do not wear jewelry, make-up, lotions, powders or perfumes, deodorant             Do not wear nail polish on your fingernails.  Do not shave  48 hours prior to surgery.              Men may shave face and neck.   Do not bring valuables to the hospital. [place].  Contacts, dentures or bridgework may not be worn into surgery.  Leave suitcase in the car. After surgery it may be brought to your room.     Patients discharged the day of surgery will not be allowed to drive home. IF YOU ARE HAVING SURGERY AND GOING HOME THE SAME DAY, YOU MUST HAVE AN ADULT TO DRIVE YOU HOME AND BE WITH YOU FOR 24 HOURS. YOU MAY GO HOME BY TAXI OR UBER OR ORTHERWISE, BUT AN ADULT MUST ACCOMPANY YOU HOME AND STAY WITH YOU FOR 24 HOURS.  Name and phone number of your driver:  Special Instructions: N/A              Please read over the following fact sheets you were given: _____________________________________________________________________  [facility] - Preparing for Surgery Before surgery, you can play an important role.  Because skin is not sterile, your skin needs to be as free of germs as possible.  You can reduce the number of germs on your skin by washing with CHG (chlorahexidine gluconate) soap before surgery.  CHG is an antiseptic cleaner which kills germs and bonds with the skin to continue killing germs even after washing. Please DO NOT use if you have an allergy to CHG or antibacterial soaps.  If your skin becomes reddened/irritated stop using the CHG and inform your nurse when you arrive at Short Stay. Do not shave (including legs and underarms) for at least 48 hours prior to the first CHG shower.  You may shave your face/neck. Please follow these instructions carefully:  1.  Shower with CHG Soap the night before surgery and the  morning of Surgery.  2.  If you choose to wash your hair, wash your hair first as usual  with your  normal  shampoo.  3.  After you shampoo, rinse your hair and body thoroughly to remove the  shampoo.                           4.  Use CHG as you would any other liquid soap.  You can apply chg directly  to the skin and wash                       Gently with a scrungie or clean washcloth.  5.  Apply the CHG Soap to your body ONLY FROM THE NECK DOWN.   Do not use on face/ open                           Wound or open sores. Avoid contact with eyes, ears mouth and genitals (private parts).                       Wash face,  Genitals (private parts) with your normal soap.             6.  Wash thoroughly, paying special attention to the area where your surgery  will be performed.  7.  Thoroughly rinse your body with warm water from the neck down.  8.  DO NOT shower/wash with your normal soap after using and rinsing off  the CHG Soap.                9.  Pat yourself dry with a clean towel.            10.  Wear clean pajamas.            11.  Place clean sheets on your bed the night of your first shower and do not  sleep with pets. Day of Surgery : Do not apply any lotions/deodorants the morning of surgery.  Please wear clean clothes to the hospital/surgery center.  FAILURE TO FOLLOW THESE INSTRUCTIONS MAY RESULT IN THE CANCELLATION OF YOUR SURGERY PATIENT SIGNATURE_________________________________  NURSE SIGNATURE__________________________________  ________________________________________________________________________

## 2019-11-20 ENCOUNTER — Encounter (HOSPITAL_COMMUNITY): Payer: Self-pay

## 2019-11-20 ENCOUNTER — Other Ambulatory Visit: Payer: Self-pay

## 2019-11-20 ENCOUNTER — Other Ambulatory Visit (HOSPITAL_COMMUNITY)
Admission: RE | Admit: 2019-11-20 | Discharge: 2019-11-20 | Disposition: A | Discharge status: Home / Self Care | Payer: Medicare Other | Source: Ambulatory Visit | Attending: Urology | Admitting: Urology

## 2019-11-20 ENCOUNTER — Encounter (HOSPITAL_COMMUNITY)
Admission: RE | Admit: 2019-11-20 | Discharge: 2019-11-20 | Disposition: A | Discharge status: Home / Self Care | Payer: Medicare Other | Source: Ambulatory Visit | Attending: Urology | Admitting: Urology

## 2019-11-20 DIAGNOSIS — Z01812 Encounter for preprocedural laboratory examination: Secondary | ICD-10-CM | POA: Insufficient documentation

## 2019-11-20 DIAGNOSIS — Z20822 Contact with and (suspected) exposure to covid-19: Secondary | ICD-10-CM | POA: Insufficient documentation

## 2019-11-20 LAB — CBC
HCT: 35.1 % — ABNORMAL LOW (ref 39.0–52.0)
Hemoglobin: 11.2 g/dL — ABNORMAL LOW (ref 13.0–17.0)
MCH: 29 pg (ref 26.0–34.0)
MCHC: 31.9 g/dL (ref 30.0–36.0)
MCV: 90.9 fL (ref 80.0–100.0)
Platelets: 142 10*3/uL — ABNORMAL LOW (ref 150–400)
RBC: 3.86 MIL/uL — ABNORMAL LOW (ref 4.22–5.81)
RDW: 14.7 % (ref 11.5–15.5)
WBC: 7.1 10*3/uL (ref 4.0–10.5)
nRBC: 0 % (ref 0.0–0.2)

## 2019-11-20 LAB — BASIC METABOLIC PANEL
Anion gap: 7 (ref 5–15)
BUN: 22 mg/dL (ref 8–23)
CO2: 22 mmol/L (ref 22–32)
Calcium: 9.1 mg/dL (ref 8.9–10.3)
Chloride: 113 mmol/L — ABNORMAL HIGH (ref 98–111)
Creatinine, Ser: 1.77 mg/dL — ABNORMAL HIGH (ref 0.61–1.24)
GFR calc Af Amer: 40 mL/min — ABNORMAL LOW (ref >60)
GFR calc non Af Amer: 35 mL/min — ABNORMAL LOW (ref >60)
Glucose, Bld: 96 mg/dL (ref 70–99)
Potassium: 4.1 mmol/L (ref 3.5–5.1)
Sodium: 142 mmol/L (ref 135–145)

## 2019-11-20 LAB — SARS CORONAVIRUS 2 (TAT 6-24 HRS): SARS Coronavirus 2: NEGATIVE

## 2019-11-20 NOTE — Progress Notes (Addendum)
Anesthesia Review:  PCP: DR Doree Bart[facility] Not seen in at least 2 years per wife Cardiologist :NOt followed by a cardiologist  Chest x-ray : EKG : 10/14/19  Echo : Stress test: Cardiac Cath :  Activity level: Uses cane can not do a fight of stairs without difficulty  Sleep Study/ CPAP : Fasting Blood Sugar :      / Checks Blood Sugar -- times a day:   Blood Thinner/ Instructions /Last Dose: ASA / Instructions/ Last Dose :  BMP done 11/20/19 routed via epic to DR Birden.  Last cysto 10/16/2019

## 2019-11-20 NOTE — Progress Notes (Signed)
BMP done 11/20/19 routed via epic to Dr Alinda Money.

## 2019-11-20 NOTE — Progress Notes (Signed)
Wife reports that pt is not followed by a cardiologist.   PCP- Dr Allen Kell in [place]- not seen in at least 2 years per wife Not followed by a cardiologist.

## 2019-11-20 NOTE — H&P (Signed)
Office Visit Report     10/28/2019   --------------------------------------------------------------------------------   Lattie Corns  MRN: [id number]  DOB: 1933/01/31, 86 year old Male  SSN: [id number]   PRIMARY CARE:    REFERRING:  Aloha Gell. Hurt, MD  PROVIDER:  Raynelle Bring, M.D.  LOCATION:  [facility] [phone]     --------------------------------------------------------------------------------   CC/HPI: 1. Urothelial carcinoma of the right renal pelvis and bladder  2. Prostate cancer   Rush Landmark returns today for removal of his right ureteral stent of his solitary right kidney following laser ablation of his right renal pelvic tumor. He was not noted to have any bladder tumors at his recent cystoscopic evaluation. He did start developing gross hematuria yesterday which has persisted in to today. He also follows up to review his pathology from his recent procedure. I was able to laser ablate much of his tumor was able to actually remove some of this tumor with age overnight basket. This was sent to pathology. This revealed areas of high-grade urothelial carcinoma with necrosis.     ALLERGIES: None    MEDICATIONS: No Medications    GU PSH: Bladder Instill AntiCA Agent - 01/16/2019, 01/09/2019, 01/02/2019, 10/31/2018, 09/09/2018, 09/02/2018, 08/26/2018, 08/12/2018, 08/05/2018, 07/29/2018, 06/03/2018, 05/27/2018, 05/20/2018, 05/13/2018, 05/06/2018, 04/29/2018 Catheterization For Collection Of Specimen, Single Patient, All Places Of Service - 01/28/2018 Cysto Remove Stent FB Sim - 12/17/2018, 11/13/2018, 01/08/2018, 2018 Cysto Uretero Biopsy Fulgura, Right - 12/09/2018, Right - 12/24/2017, Right - 2018 Cysto Uretero W/excise Tumor, Right - 10/16/2019, Right - 10/31/2018, Right - 2018, Right - 2018 Cystoscopy - 10/01/2019, 07/09/2019, 04/09/2019, 03/12/2019, 10/18/2018, 07/12/2018, 2020, 11/23/2017 Cystoscopy Fulguration - 01/31/2018 Cystoscopy Insert Stent, Right - 10/16/2019, Right - 12/09/2018,  Right - 10/31/2018, Right - 10/31/2018, Right - 12/24/2017, Right - 2019, Right - 2018, Right - 2018, Right - 2018 Cystoscopy TURBT <2 cm - 10/31/2018 Cystoscopy TURBT >5 cm - 2020 Cystoscopy TURBT 2-5 cm - 02/11/2018, Right - 12/24/2017 Cystoscopy Ureteroscopy, Right - 2019 Locm 300-399Mg /Ml Iodine,1Ml - 10/15/2018, 11/19/2017, 2019 Radical Prostatectomy     NON-GU PSH: Cataract surgery, Bilateral - 11/08/2017 Coronary Artery Bypass Grafting     GU PMH: Bladder Cancer overlapping sites - 01/08/2018 Gross hematuria - 12/10/2017, - 11/08/2017, - 2018 Renal pelvis cancer, right - 2018 History of kidney cancer - 2018 Prostate Cancer - 2018 Right renal neoplasm - 2018      PMH Notes:   1) Renal cell carcinoma: He is s/p an open left radical nephrectomy at [facility] in 1994.   2) Prostate cancer: He is s/p an open RRP at [facility] in 1994. He apparently had a biochemical recurrence soon after primary therapy and has been on systemic therapy with ADT since then under his urologist in [place].   3) Urothelial carcinoma of the right kidney: He was found to have a right renal collecting system tumor on imaging in 2018 in his solitary right kidney. This was confirmed on ureteroscopic evaluation to be high grade urothelial carcinoma.   Aug 2018: Right ureteroscopy with laser ablation of right renal pelvic tumor - pathology indicated high-grade urothelial carcinoma  Sep 2018: Repeat right ureteroscopy with laser ablation of remaining right renal pelvic tumor - pathology confirmed high-grade urothelial carcinoma with a suspicious focus of lamina propria invasion  Nov 2018: Repeat right ureteroscopy - Minimal tumor remaining and completely laser ablated  May 2019: Cystoscopy with surveillance right ureteroscopy - Negative  Oct 2019: He develop recurrent hematuria, right ureteroscopy with  laser ablation of small tumor in lower pole and TURBT of bladder tumor - High grade T1a urothelial  carcinoma  Dec 2019: Recurrent hematuria and recurrent tumor  Dec 2019: TURBT - Multifocal high grade, T1 urothelial carcinoma  Jan 2020: TURBT - Recurrent multifocal high grade, T1 urothelial carcinoma, post-op intravesical gemcitabine  Feb-Apr 2020: 6 week induction BCG  May 2020: Positive cytology (no visible tumors)  Jun-Jul 2020: Repeat 6 week induction BCG  Sep 2020: Right ureteroscopic laser ablation of 1.5 cm lower pole tumor, TURBT small left 1 cm bladder tumor- high grade Ta  Oct 2020: Right ureteroscopy with completion ablation of right renal pelvic tumor  Nov 2020: 3 week maintenance BCG  Aug 2021: TURBT and right ureteroscopy -   4) Chronic bacteruria:      NON-GU PMH: Bacteriuria (Stable) - 12/17/2018, (Stable), - 11/13/2018, - 2018 Pyuria/other UA findings - 2020 Coronary Artery Disease Sleep Apnea    FAMILY HISTORY: 1 Daughter - Runs in Family 2 sons - Runs in Family Cirrhosis - Father Death - Father, Mother   SOCIAL HISTORY: Marital Status: Married Preferred Language: English; Ethnicity: Not Hispanic Or Latino; Race: White Current Smoking Status: Patient has never smoked.   Tobacco Use Assessment Completed: Used Tobacco in last 30 days? Has never drank.  Drinks 1 caffeinated drink per day.    REVIEW OF SYSTEMS:    GU Review Male:   Patient denies frequent urination, hard to postpone urination, burning/ pain with urination, get up at night to urinate, leakage of urine, stream starts and stops, trouble starting your streams, and have to strain to urinate .  Gastrointestinal (Upper):   Patient denies nausea and vomiting.  Gastrointestinal (Lower):   Patient denies diarrhea and constipation.  Constitutional:   Patient denies fever, night sweats, weight loss, and fatigue.  Skin:   Patient denies skin rash/ lesion and itching.  Eyes:   Patient denies blurred vision and double vision.  Ears/ Nose/ Throat:   Patient denies sore throat and sinus problems.   Hematologic/Lymphatic:   Patient denies swollen glands and easy bruising.  Cardiovascular:   Patient denies leg swelling and chest pains.  Respiratory:   Patient denies shortness of breath and cough.  Endocrine:   Patient denies excessive thirst.  Musculoskeletal:   Patient denies back pain and joint pain.  Neurological:   Patient denies headaches and dizziness.  Psychologic:   Patient denies depression and anxiety.   VITAL SIGNS:      10/28/2019 09:46 AM  Weight 150 lb / 68.04 kg  Height 71 in / 180.34 cm  BP 122/73 mmHg  Pulse 63 /min  Temperature 97.1 F / 36.1 C  BMI 20.9 kg/m   MULTI-SYSTEM PHYSICAL EXAMINATION:    Constitutional: Well-nourished. No physical deformities. Normally developed. Good grooming.  Respiratory: No labored breathing, no use of accessory muscles. Clear bilaterally.  Cardiovascular: Normal temperature, normal extremity pulses, no swelling, no varicosities. Regular rate and rhythm.     Complexity of Data:  Records Review:   Pathology Reports, Previous Patient Records   10/01/19 10/17/18 09/27/16  PSA  Total PSA <0.015 ng/mL <0.015 ng/mL <0.015 ng/dL    PROCEDURES:         Flexible Cystoscopy Right Stent Removal - 52310  Risks, benefits, and potential complications of the procedure were discussed at length with the patient. All questions were answered. Informed consent was obtained. Sterile technique and intraurethral analgesia were used. Antibiotic prophylaxis: ciprofloxacin  Meatus:  Normal size. Normal location.  Normal condition.  Urethra:  No strictures.  External Sphincter:  Normal.  Verumontanum:  Verumontanum Surgically Absent.  Prostate:  Prostate Surgically Absent.  Bladder Neck:  Non-obstructing.  Ureteral Orifices:  Normal location. Normal size. Normal shape. Effluxed clear urine.  Bladder:  No trabeculation. No tumors. Normal mucosa. No stones.  The right ureteral stent was carefully removed with a grasping instrument.    The  procedure was well-tolerated and without complications.         Urinalysis w/Scope - 81001 Dipstick Dipstick Cont'd Micro  Color: Red Bilirubin: Invalid RBC/hpf: Packed/hpf  Appearance: Turbid Ketones: Invalid Cystals: NS (Not Seen)  Specific Gravity: Invalid Blood: Invalid Casts: NS (Not Seen)  pH: Invalid Protein: Invalid Trichomonas: Not Present  Glucose: Invalid Urobilinogen: Invalid Mucous: Not Present    Nitrites: Invalid Epithelial Cells: NS (Not Seen)    Leukocyte Esterase: Invalid Yeast: NS (Not Seen)      Sperm: Not Present    Notes:  unable to quantitate due to increased rbcs unable to quantitate due to increased rbcs     ASSESSMENT:      ICD-10 Details  1 GU:   Renal pelvis cancer, right - C65.1   2 NON-GU:   Bacteriuria - R82.71    PLAN:           Orders Labs Urine Culture          Schedule Return Visit/Planned Activity: Other See Visit Notes             Note: Will call to schedule surgery.          Document Letter(s):  Created for Patient: Clinical Summary         Notes:   1. High-grade urothelial carcinoma of the right renal pelvis and bladder: We reviewed his recent pathology indicating persistent high-grade urothelial carcinoma the lower pole of his right renal collecting system. I did remove a bulk of this tumor and much of it was necrotic likely related to his prior laser ablation procedures. However, I do have concern that this has not been completely addressed. We have been trying to proceed with conservative therapy to maintain his right kidney his he is not interested in proceeding with nephrectomy and being on dialysis. After further discussion, we have agreed to proceed with a repeat ureteroscopic evaluation and laser ablation in the next few weeks allowing time for his hematuria to clear. His urine will be cultured today. Once we do feel confident that we have him visually clear, I will proceed with follow-up imaging at his next visit with a full  metastatic evaluation. We will then consider follow-up ureteroscopic evaluation after that appointment possibly in approximately 6 months. He will continue to undergo cystoscopic surveillance at least every 3 months right now.   2. Prostate cancer: His next PSA would be due for August of 2022.       * Signed by Raynelle Bring, M.D. on 10/28/19 at 5:37 PM (EDT)*

## 2019-11-20 NOTE — Progress Notes (Signed)
Called wife and reviewed preop instructions via phone with wife.  Informed wife that typed copy of instructions would be coming home with patient along with hibiclens.  Wife voiced understanding

## 2019-11-24 ENCOUNTER — Ambulatory Visit (HOSPITAL_COMMUNITY): Payer: Medicare Other

## 2019-11-24 ENCOUNTER — Ambulatory Visit (HOSPITAL_COMMUNITY)
Admission: RE | Admit: 2019-11-24 | Discharge: 2019-11-24 | Disposition: A | Discharge status: Home / Self Care | Payer: Medicare Other | Attending: Urology | Admitting: Urology

## 2019-11-24 ENCOUNTER — Encounter (HOSPITAL_COMMUNITY): Payer: Self-pay | Admitting: Urology

## 2019-11-24 ENCOUNTER — Ambulatory Visit (HOSPITAL_COMMUNITY): Payer: Medicare Other | Admitting: Anesthesiology

## 2019-11-24 ENCOUNTER — Ambulatory Visit (HOSPITAL_COMMUNITY): Payer: Medicare Other | Admitting: Physician Assistant

## 2019-11-24 ENCOUNTER — Encounter (HOSPITAL_COMMUNITY)
Admission: RE | Disposition: A | Discharge status: Home / Self Care | Payer: Self-pay | Source: Home / Self Care | Attending: Urology

## 2019-11-24 DIAGNOSIS — C679 Malignant neoplasm of bladder, unspecified: Secondary | ICD-10-CM | POA: Insufficient documentation

## 2019-11-24 DIAGNOSIS — Z905 Acquired absence of kidney: Secondary | ICD-10-CM | POA: Insufficient documentation

## 2019-11-24 DIAGNOSIS — Z87891 Personal history of nicotine dependence: Secondary | ICD-10-CM | POA: Insufficient documentation

## 2019-11-24 DIAGNOSIS — C651 Malignant neoplasm of right renal pelvis: Secondary | ICD-10-CM | POA: Diagnosis present

## 2019-11-24 DIAGNOSIS — G4730 Sleep apnea, unspecified: Secondary | ICD-10-CM | POA: Diagnosis not present

## 2019-11-24 DIAGNOSIS — Z951 Presence of aortocoronary bypass graft: Secondary | ICD-10-CM | POA: Diagnosis not present

## 2019-11-24 DIAGNOSIS — C61 Malignant neoplasm of prostate: Secondary | ICD-10-CM | POA: Insufficient documentation

## 2019-11-24 DIAGNOSIS — I2510 Atherosclerotic heart disease of native coronary artery without angina pectoris: Secondary | ICD-10-CM | POA: Insufficient documentation

## 2019-11-24 DIAGNOSIS — I10 Essential (primary) hypertension: Secondary | ICD-10-CM | POA: Insufficient documentation

## 2019-11-24 DIAGNOSIS — Z9079 Acquired absence of other genital organ(s): Secondary | ICD-10-CM | POA: Diagnosis not present

## 2019-11-24 DIAGNOSIS — M1990 Unspecified osteoarthritis, unspecified site: Secondary | ICD-10-CM | POA: Insufficient documentation

## 2019-11-24 HISTORY — PX: CYSTOSCOPY/RETROGRADE/URETEROSCOPY: SHX5316

## 2019-11-24 SURGERY — CYSTOSCOPY/RETROGRADE/URETEROSCOPY
Anesthesia: General | Laterality: Right

## 2019-11-24 MED ORDER — MEPERIDINE HCL 50 MG/ML IJ SOLN: 6.2500 mg | INTRAMUSCULAR | Status: DC | PRN

## 2019-11-24 MED ORDER — EPHEDRINE SULFATE-NACL 50-0.9 MG/10ML-% IV SOSY
PREFILLED_SYRINGE | INTRAVENOUS | Status: DC | PRN
  Administered 2019-11-24: 10 mg via INTRAVENOUS
  Administered 2019-11-24: 5 mg via INTRAVENOUS

## 2019-11-24 MED ORDER — 0.9 % SODIUM CHLORIDE (POUR BTL) OPTIME
TOPICAL | Status: DC | PRN
  Administered 2019-11-24: 1000 mL

## 2019-11-24 MED ORDER — CHLORHEXIDINE GLUCONATE 0.12 % MT SOLN
15.0000 mL | Freq: Once | OROMUCOSAL | Status: AC
  Administered 2019-11-24: 15 mL via OROMUCOSAL

## 2019-11-24 MED ORDER — ONDANSETRON HCL 4 MG/2ML IJ SOLN
INTRAMUSCULAR | Status: DC | PRN
  Administered 2019-11-24: 4 mg via INTRAVENOUS

## 2019-11-24 MED ORDER — FENTANYL CITRATE (PF) 100 MCG/2ML IJ SOLN
INTRAMUSCULAR | Status: DC | PRN
Start: 2019-11-24 — End: 2019-11-24
  Administered 2019-11-24 (×3): 25 ug via INTRAVENOUS

## 2019-11-24 MED ORDER — SODIUM CHLORIDE 0.9 % IR SOLN
Status: DC | PRN
  Administered 2019-11-24: 3000 mL

## 2019-11-24 MED ORDER — LACTATED RINGERS IV SOLN: INTRAVENOUS | Status: DC

## 2019-11-24 MED ORDER — FENTANYL CITRATE (PF) 100 MCG/2ML IJ SOLN
INTRAMUSCULAR | Status: AC
Start: 2019-11-24 — End: ?
  Filled 2019-11-24: qty 2

## 2019-11-24 MED ORDER — OXYCODONE HCL 5 MG/5ML PO SOLN: 5.0000 mg | Freq: Once | ORAL | Status: DC | PRN

## 2019-11-24 MED ORDER — IOHEXOL 300 MG/ML  SOLN
INTRAMUSCULAR | Status: DC | PRN
  Administered 2019-11-24: 10 mL

## 2019-11-24 MED ORDER — MIDAZOLAM HCL 2 MG/2ML IJ SOLN
INTRAMUSCULAR | Status: AC
  Filled 2019-11-24: qty 2

## 2019-11-24 MED ORDER — PROPOFOL 10 MG/ML IV BOLUS
INTRAVENOUS | Status: AC
  Filled 2019-11-24: qty 20

## 2019-11-24 MED ORDER — SODIUM CHLORIDE 0.9 % IV SOLN
2.0000 g | Freq: Once | INTRAVENOUS | Status: AC
  Administered 2019-11-24: 2 g via INTRAVENOUS
  Filled 2019-11-24: qty 20

## 2019-11-24 MED ORDER — ONDANSETRON HCL 4 MG/2ML IJ SOLN
INTRAMUSCULAR | Status: AC
  Filled 2019-11-24: qty 2

## 2019-11-24 MED ORDER — ONDANSETRON HCL 4 MG/2ML IJ SOLN: 4.0000 mg | Freq: Once | INTRAMUSCULAR | Status: DC | PRN

## 2019-11-24 MED ORDER — ACETAMINOPHEN 325 MG PO TABS: 325.0000 mg | ORAL_TABLET | ORAL | Status: DC | PRN

## 2019-11-24 MED ORDER — ACETAMINOPHEN 160 MG/5ML PO SOLN: 325.0000 mg | ORAL | Status: DC | PRN

## 2019-11-24 MED ORDER — EPHEDRINE 5 MG/ML INJ
INTRAVENOUS | Status: AC
  Filled 2019-11-24: qty 10

## 2019-11-24 MED ORDER — OXYCODONE HCL 5 MG PO TABS: 5.0000 mg | ORAL_TABLET | Freq: Once | ORAL | Status: DC | PRN

## 2019-11-24 MED ORDER — ORAL CARE MOUTH RINSE: 15.0000 mL | Freq: Once | OROMUCOSAL | Status: AC

## 2019-11-24 MED ORDER — LIDOCAINE 2% (20 MG/ML) 5 ML SYRINGE
INTRAMUSCULAR | Status: DC | PRN
  Administered 2019-11-24: 100 mg via INTRAVENOUS

## 2019-11-24 MED ORDER — FENTANYL CITRATE (PF) 100 MCG/2ML IJ SOLN: 25.0000 ug | INTRAMUSCULAR | Status: DC | PRN

## 2019-11-24 MED ORDER — LIDOCAINE 2% (20 MG/ML) 5 ML SYRINGE
INTRAMUSCULAR | Status: AC
  Filled 2019-11-24: qty 5

## 2019-11-24 MED ORDER — PROPOFOL 10 MG/ML IV BOLUS
INTRAVENOUS | Status: DC | PRN
  Administered 2019-11-24: 130 mg via INTRAVENOUS

## 2019-11-24 SURGICAL SUPPLY — 24 items
BAG URO CATCHER STRL LF (MISCELLANEOUS) ×3 IMPLANT
BASKET STNLS GEMINI 4WIRE 3FR (BASKET) ×2 IMPLANT
BASKET ZERO TIP NITINOL 2.4FR (BASKET) IMPLANT
BSKT STON RTRVL GEM 120X11 3FR (BASKET) ×1
BSKT STON RTRVL ZERO TP 2.4FR (BASKET)
CATH INTERMIT  6FR 70CM (CATHETERS) ×2 IMPLANT
CLOTH BEACON ORANGE TIMEOUT ST (SAFETY) ×3 IMPLANT
GLOVE BIOGEL M STRL SZ7.5 (GLOVE) ×3 IMPLANT
GOWN STRL REUS W/TWL LRG LVL3 (GOWN DISPOSABLE) ×3 IMPLANT
GUIDEWIRE STR DUAL SENSOR (WIRE) ×3 IMPLANT
GUIDEWIRE ZIPWRE .038 STRAIGHT (WIRE) IMPLANT
IV NS 1000ML (IV SOLUTION) ×3
IV NS 1000ML BAXH (IV SOLUTION) ×1 IMPLANT
KIT TURNOVER KIT A (KITS) IMPLANT
LASER FIB FLEXIVA PULSE ID 365 (Laser) IMPLANT
MANIFOLD NEPTUNE II (INSTRUMENTS) ×3 IMPLANT
PACK CYSTO (CUSTOM PROCEDURE TRAY) ×3 IMPLANT
SHEATH URETERAL 12FRX35CM (MISCELLANEOUS) ×2 IMPLANT
STENT URET 6FRX26 CONTOUR (STENTS) ×3 IMPLANT
TRACTIP FLEXIVA PULS ID 200XHI (Laser) IMPLANT
TRACTIP FLEXIVA PULSE ID 200 (Laser) ×3
TUBING CONNECTING 10 (TUBING) ×2 IMPLANT
TUBING CONNECTING 10' (TUBING) ×1
TUBING UROLOGY SET (TUBING) ×3 IMPLANT

## 2019-11-24 NOTE — Anesthesia Postprocedure Evaluation (Signed)
Anesthesia Post Note  Patient: Wesley Snyder.  Procedure(s) Performed: CYSTOSCOPY/RETROGRADE/URETEROSCOPY/ URETEROSCOPIC LASER ABLATION OF TUMOR, URETERAL STENT (Right )     Patient location during evaluation: PACU Anesthesia Type: General Level of consciousness: sedated and patient cooperative Pain management: pain level controlled Vital Signs Assessment: post-procedure vital signs reviewed and stable Respiratory status: spontaneous breathing Cardiovascular status: stable Anesthetic complications: no   No complications documented.  Last Vitals:  Vitals:   11/24/19 1545 11/24/19 1600  BP: (!) 157/76 (!) 155/81  Pulse: (!) 55 (!) 58  Resp: 12 12  Temp: 36.5 C 36.5 C  SpO2: 100% 94%    Last Pain:  Vitals:   11/24/19 1600  TempSrc:   PainSc: 0-No pain                 Nolon Nations

## 2019-11-24 NOTE — Discharge Instructions (Signed)

## 2019-11-24 NOTE — Anesthesia Procedure Notes (Signed)
Procedure Name: LMA Insertion Date/Time: 11/24/2019 2:10 PM Performed by: Mitzie Na, CRNA Pre-anesthesia Checklist: Patient identified, Emergency Drugs available, Suction available and Patient being monitored Patient Re-evaluated:Patient Re-evaluated prior to induction Oxygen Delivery Method: Circle system utilized Preoxygenation: Pre-oxygenation with 100% oxygen Induction Type: IV induction LMA: LMA inserted LMA Size: 4.0 Number of attempts: 1 Placement Confirmation: breath sounds checked- equal and bilateral and positive ETCO2 Tube secured with: Tape Dental Injury: Teeth and Oropharynx as per pre-operative assessment

## 2019-11-24 NOTE — Transfer of Care (Signed)
Immediate Anesthesia Transfer of Care Note  Patient: Wesley Snyder.  Procedure(s) Performed: CYSTOSCOPY/RETROGRADE/URETEROSCOPY/ URETEROSCOPIC LASER ABLATION OF TUMOR, URETERAL STENT (Right )  Patient Location: PACU  Anesthesia Type:General  Level of Consciousness: awake, oriented and drowsy  Airway & Oxygen Therapy: Patient Spontanous Breathing and Patient connected to face mask  Post-op Assessment: Report given to RN, Post -op Vital signs reviewed and stable and Patient moving all extremities  Post vital signs: Reviewed and stable  Last Vitals:  Vitals Value Taken Time  BP 152/78 11/24/19 1524  Temp 36.5 C 11/24/19 1524  Pulse 71 11/24/19 1527  Resp 14 11/24/19 1527  SpO2 100 % 11/24/19 1527  Vitals shown include unvalidated device data.  Last Pain:  Vitals:   11/24/19 1306  TempSrc: Oral         Complications: No complications documented.

## 2019-11-24 NOTE — Op Note (Signed)
Preoperative diagnosis: Urothelial carcinoma of the right renal pelvis  Postoperative diagnosis: Urothelial carcinoma of the right renal pelvis  Procedure: 1.  Cystoscopy 2.  Right retrograde pyelography with interpretation 3.  Right ureteroscopy with laser ablation of tumor 4.  Right ureteral stent placement (6 x 26 - no string)  Surgeon: Roxy Horseman, Brooke Bonito MD  Anesthesia: General  Complications: None  EBL: Minimal  Specimens: Right renal pelvic tumor  Disposition of specimen: Pathology  Intraoperative findings: Right retrograde pyelography was performed with Omnipaque contrast and a 6 French ureteral catheter.  This demonstrated a filling defect in the lower pole of the right renal collecting system consistent with the patient's known tumor.  No other filling defects within the renal collecting system or ureter was identified.  Indication: Mr. Haisley is an 86 year old gentleman with a history of recurrent urothelial carcinoma of the bladder and right renal collecting system.  He has been treated with endoscopic therapy considering his solitary kidney and refusal of nephrectomy.  He presents today for further laser ablation of his known tumor.  The potential risks, complications, and the expected recovery process associated with the above procedures was discussed in detail.  Informed consent was obtained.  Description of procedure: The patient was taken the operating room and a general anesthetic was administered.  He was given preoperative antibiotics, placed in the dorsolithotomy position, and prepped and draped in the usual sterile fashion.  Next, a preoperative timeout was performed.  Cystourethroscopy was then performed.  The bladder was examined and there did not appear to be any bladder tumors or other mucosal pathology.  The left ureteral orifice was absent.  The right ureteral orifice was then cannulated with a 6 French ureteral catheter and Omnipaque contrast was injected.  This  revealed findings as dictated above.  There did appear to be a remaining filling defect in the lower pole of the right renal collecting system consistent with the patient's known papillary tumor.  A 0.38 sensor guidewire was then advanced up into the right renal collecting system.  This was done under fluoroscopic guidance.  A 12/14 ureteral access sheath was then advanced over the wire.  A digital flexible ureteroscope was then advanced into the renal collecting system and the collecting system was systematically examined.  Within the lower pole, there was noted to be a remaining tumor.  No other abnormalities were noted.  Most of this tumor did appear to be somewhat necrotic consistent with prior ablation procedures.  A Gemini stone basket was then used to debulk some of the tumor which was removed.  A 200 m holmium laser fiber was then advanced through the ureteroscope and laser ablation was performed on a setting of 1 J and 10 Hz.  Much of the tumor was then able to be removed after it was amputated with the laser.  Further inspection revealed no significant bulk of tumor remaining.  There was some bleeding at this point and it was elected to place a ureteral stent and end the procedure.  The guidewire was left in place and the ureteral access sheath and ureteroscope were removed.  The wire was then backloaded on the cystoscope and a 6 x 26 double-J ureteral stent was advanced over the wire using Seldinger technique and positioned appropriately under fluoroscopic and cystoscopic guidance.  The wire was removed with a good curl noted in the renal pelvis as well as within the bladder.  The patient tolerated the procedure well without complications.  He was able  to be awakened and transferred to the recovery unit in satisfactory condition.

## 2019-11-24 NOTE — Anesthesia Preprocedure Evaluation (Addendum)
Anesthesia Evaluation  Patient identified by MRN, date of birth, ID band Patient awake    Reviewed: Allergy & Precautions, NPO status , Patient's Chart, lab work & pertinent test results  History of Anesthesia Complications (+) history of anesthetic complications  Airway Mallampati: I  TM Distance: >3 FB Neck ROM: Full    Dental  (+) Poor Dentition, Dental Advisory Given   Pulmonary shortness of breath, former smoker,    Pulmonary exam normal        Cardiovascular hypertension, + CAD  Normal cardiovascular exam     Neuro/Psych negative neurological ROS  negative psych ROS   GI/Hepatic negative GI ROS, Neg liver ROS,   Endo/Other  negative endocrine ROS  Renal/GU Renal disease  negative genitourinary   Musculoskeletal  (+) Arthritis , Osteoarthritis,    Abdominal   Peds  Hematology negative hematology ROS (+)   Anesthesia Other Findings   Reproductive/Obstetrics                            Anesthesia Physical  Anesthesia Plan  ASA: III  Anesthesia Plan: General   Post-op Pain Management:    Induction: Intravenous  PONV Risk Score and Plan: 2 and Ondansetron and Dexamethasone  Airway Management Planned: Oral ETT and LMA  Additional Equipment:   Intra-op Plan:   Post-operative Plan: Extubation in OR  Informed Consent: I have reviewed the patients History and Physical, chart, labs and discussed the procedure including the risks, benefits and alternatives for the proposed anesthesia with the patient or authorized representative who has indicated his/her understanding and acceptance.     Dental advisory given  Plan Discussed with: CRNA and Anesthesiologist  Anesthesia Plan Comments:         Anesthesia Quick Evaluation

## 2019-11-24 NOTE — Interval H&P Note (Signed)
History and Physical Interval Note:  11/24/2019 1:42 PM  Wesley Snyder.  has presented today for surgery, with the diagnosis of UROTHELIAL CARCINOMA OF RIGHT RENAL PELVIS.  The various methods of treatment have been discussed with the patient and family. After consideration of risks, benefits and other options for treatment, the patient has consented to  Procedure(s): CYSTOSCOPY/RETROGRADE/URETEROSCOPY/ URETEROSCOPIC LASER ABLATION OF TUMOR, URETERAL STENT (Right) as a surgical intervention.  The patient's history has been reviewed, patient examined, no change in status, stable for surgery.  I have reviewed the patient's chart and labs.  Questions were answered to the patient's satisfaction.     [employer]

## 2019-11-25 ENCOUNTER — Encounter (HOSPITAL_COMMUNITY): Payer: Self-pay | Admitting: Urology

## 2019-11-25 LAB — SURGICAL PATHOLOGY

## 2020-04-02 ENCOUNTER — Other Ambulatory Visit: Payer: Self-pay | Admitting: Urology

## 2020-04-05 NOTE — Patient Instructions (Addendum)
DUE TO COVID-19 ONLY ONE VISITOR IS ALLOWED TO COME WITH YOU AND STAY IN THE WAITING ROOM ONLY DURING PRE OP AND PROCEDURE.     COVID SWAB TESTING MUST BE COMPLETED ON:  Thursday, 04-09-20 @ 9:40   [address] [place], [place] [address]  (Must self quarantine after testing. Follow instructions on handout.)    Your procedure is scheduled on: Monday, 04-12-20   Report to [facility] Main  Entrance    Report to admitting at 10:45 AM   Call this number if you have problems the morning of surgery [phone]   Do not eat food :After Midnight.   May have liquids until 9:45 AM day of surgery  CLEAR LIQUID DIET  Foods Allowed                                                                     Foods Excluded  Water, Black Coffee and tea, regular and decaf              liquids that you cannot  Plain Jell-O in any flavor  (No red)                                    see through such as: Fruit ices (not with fruit pulp)                                      milk, soups, orange juice              Iced Popsicles (No red)                                      All solid food                                   Apple juices Sports drinks like Gatorade (No red) Lightly seasoned clear broth or consume(fat free) Sugar, honey syrup      Oral Hygiene is also important to reduce your risk of infection.                                    Remember - BRUSH YOUR TEETH THE MORNING OF SURGERY WITH YOUR REGULAR TOOTHPASTE   Do NOT smoke after Midnight   Take these medicines the morning of surgery with A SIP OF WATER:  None                                You may not have any metal on your body including  jewelry, and body piercings             Do not wear lotions, powders, perfumes/cologne, or deodorant             Men may shave face and neck.  Do not bring valuables to the hospital. [place].   Contacts, dentures or bridgework may not be worn into  surgery.   Patients discharged the day of surgery will not be allowed to drive home.              Please read over the following fact sheets you were given: IF YOU HAVE QUESTIONS ABOUT YOUR PRE OP INSTRUCTIONS PLEASE CALL  [facility] - Preparing for Surgery Before surgery, you can play an important role.  Because skin is not sterile, your skin needs to be as free of germs as possible.  You can reduce the number of germs on your skin by washing with CHG (chlorahexidine gluconate) soap before surgery.  CHG is an antiseptic cleaner which kills germs and bonds with the skin to continue killing germs even after washing. Please DO NOT use if you have an allergy to CHG or antibacterial soaps.  If your skin becomes reddened/irritated stop using the CHG and inform your nurse when you arrive at Short Stay. Do not shave (including legs and underarms) for at least 48 hours prior to the first CHG shower.  You may shave your face/neck.  Please follow these instructions carefully:  1.  Shower with CHG Soap the night before surgery and the  morning of surgery.  2.  If you choose to wash your hair, wash your hair first as usual with your normal  shampoo.  3.  After you shampoo, rinse your hair and body thoroughly to remove the shampoo.                             4.  Use CHG as you would any other liquid soap.  You can apply chg directly to the skin and wash.  Gently with a scrungie or clean washcloth.  5.  Apply the CHG Soap to your body ONLY FROM THE NECK DOWN.   Do   not use on face/ open                           Wound or open sores. Avoid contact with eyes, ears mouth and   genitals (private parts).                       Wash face,  Genitals (private parts) with your normal soap.             6.  Wash thoroughly, paying special attention to the area where your    surgery  will be performed.  7.  Thoroughly rinse your body with warm water from the neck down.  8.  DO NOT shower/wash with  your normal soap after using and rinsing off the CHG Soap.                9.  Pat yourself dry with a clean towel.            10.  Wear clean pajamas.            11.  Place clean sheets on your bed the night of your first shower and do not  sleep with pets. Day of Surgery : Do not apply any lotions/deodorants the morning of surgery.  Please wear clean clothes to the hospital/surgery center.  FAILURE TO FOLLOW THESE INSTRUCTIONS MAY RESULT IN THE CANCELLATION  OF YOUR SURGERY  PATIENT SIGNATURE_________________________________  NURSE SIGNATURE__________________________________  ________________________________________________________________________

## 2020-04-05 NOTE — Progress Notes (Addendum)
COVID Vaccine Completed:  x2 Date COVID Vaccine completed: 2nd shot 6/21 COVID vaccine manufacturer: [place]  Date of COVID positive in last 90 days:  PCP - Majel Homer, PA Cardiologist - Not recently followed by cardiology.  Hx of CABG 20+ years ago.  Chest x-ray - N/A EKG - 10-04-19 in Epic Stress Test -  ECHO -  Cardiac Cath -  Pacemaker/ICD device last checked:  Sleep Study - N/A CPAP -   Fasting Blood Sugar - N/A Checks Blood Sugar _____ times a day  Blood Thinner Instructions:  N/A Aspirin Instructions: Last Dose:  Activity level:   Can go up a flight of stairs without stopping and without symptoms.  Patient able to do ADLs independently.    Anesthesia review:  CBC and BMP reviewed by Janett Billow.  Repeat CBC day of procedure.  Patient denies shortness of breath, fever, cough and chest pain at PAT appointment   Patient verbalized understanding of instructions that were given to them at the PAT appointment. Patient was also instructed that they will need to review over the PAT instructions again at home before surgery.

## 2020-04-09 ENCOUNTER — Encounter (HOSPITAL_COMMUNITY)
Admission: RE | Admit: 2020-04-09 | Discharge: 2020-04-09 | Disposition: A | Discharge status: Home / Self Care | Payer: Medicare Other | Source: Ambulatory Visit | Attending: Urology | Admitting: Urology

## 2020-04-09 ENCOUNTER — Other Ambulatory Visit (HOSPITAL_COMMUNITY)
Admission: RE | Admit: 2020-04-09 | Discharge: 2020-04-09 | Disposition: A | Discharge status: Home / Self Care | Payer: Medicare Other | Source: Ambulatory Visit | Attending: Urology | Admitting: Urology

## 2020-04-09 ENCOUNTER — Encounter (HOSPITAL_COMMUNITY): Payer: Self-pay

## 2020-04-09 DIAGNOSIS — Z01812 Encounter for preprocedural laboratory examination: Secondary | ICD-10-CM | POA: Diagnosis not present

## 2020-04-09 DIAGNOSIS — Z20822 Contact with and (suspected) exposure to covid-19: Secondary | ICD-10-CM | POA: Diagnosis not present

## 2020-04-09 HISTORY — DX: Anemia, unspecified: D64.9

## 2020-04-09 HISTORY — DX: Chronic kidney disease, unspecified: N18.9

## 2020-04-09 LAB — CBC
HCT: 26 % — ABNORMAL LOW (ref 39.0–52.0)
Hemoglobin: 8.2 g/dL — ABNORMAL LOW (ref 13.0–17.0)
MCH: 29.5 pg (ref 26.0–34.0)
MCHC: 31.5 g/dL (ref 30.0–36.0)
MCV: 93.5 fL (ref 80.0–100.0)
Platelets: 160 10*3/uL (ref 150–400)
RBC: 2.78 MIL/uL — ABNORMAL LOW (ref 4.22–5.81)
RDW: 16.5 % — ABNORMAL HIGH (ref 11.5–15.5)
WBC: 8.8 10*3/uL (ref 4.0–10.5)
nRBC: 0 % (ref 0.0–0.2)

## 2020-04-09 LAB — BASIC METABOLIC PANEL
Anion gap: 6 (ref 5–15)
BUN: 46 mg/dL — ABNORMAL HIGH (ref 8–23)
CO2: 21 mmol/L — ABNORMAL LOW (ref 22–32)
Calcium: 9.1 mg/dL (ref 8.9–10.3)
Chloride: 112 mmol/L — ABNORMAL HIGH (ref 98–111)
Creatinine, Ser: 3.19 mg/dL — ABNORMAL HIGH (ref 0.61–1.24)
GFR, Estimated: 18 mL/min — ABNORMAL LOW (ref >60)
Glucose, Bld: 85 mg/dL (ref 70–99)
Potassium: 4.7 mmol/L (ref 3.5–5.1)
Sodium: 139 mmol/L (ref 135–145)

## 2020-04-09 LAB — SARS CORONAVIRUS 2 (TAT 6-24 HRS): SARS Coronavirus 2: NEGATIVE

## 2020-04-09 NOTE — H&P (Signed)
--------------------------------------------------------------------------------   Wesley Snyder  MRN: [id number]  DOB: [birth year], 87 year old Male  SSN: [id number]   PRIMARY CARE:    REFERRING:  Azucena Fallen, NP  PROVIDER:  Raynelle Bring, M.D.  LOCATION:  [facility] [phone]     --------------------------------------------------------------------------------   CC/HPI: 1. Urothelial carcinoma the right renal pelvis and bladder  2. Solitary right kidney  3. Prostate cancer   Rush Landmark returns today for surveillance cystoscopy. Unfortunately, he did recently suffer a fall and injured his knee. He is currently using a cane and intermittently a wheelchair. He has not noted any recent gross hematuria. He denies dysuria. He denies flank pain.     ALLERGIES: None    MEDICATIONS: No Medications    GU PSH: Bladder Instill AntiCA Agent - 01/16/2019, 01/09/2019, 01/02/2019, 10/31/2018, 09/09/2018, 09/02/2018, 08/26/2018, 08/12/2018, 08/05/2018, 07/29/2018, 06/03/2018, 05/27/2018, 05/20/2018, 05/13/2018, 05/06/2018, 04/29/2018 Catheterization For Collection Of Specimen, Single Patient, All Places Of Service - 01/28/2018 Cysto Remove Stent FB Sim - 12/09/2019, 10/28/2019, 12/17/2018, 11/13/2018, 01/08/2018, 2018 Cysto Uretero Biopsy Fulgura, Right - 12/09/2018, Right - 12/24/2017, Right - 2018 Cysto Uretero W/excise Tumor, Right - 11/24/2019, Right - 10/16/2019, Right - 10/31/2018, Right - 2018, Right - 2018 Cystoscopy - 10/01/2019, 07/09/2019, 04/09/2019, 03/12/2019, 10/18/2018, 07/12/2018, 03/14/2018, 11/23/2017 Cystoscopy Fulguration - 01/31/2018 Cystoscopy Insert Stent, Right - 11/24/2019, Right - 10/16/2019, Right - 12/09/2018, Right - 10/31/2018, Right - 10/31/2018, Right - 12/24/2017, Right - 2019, Right - 2018, Right - 2018, Right - 2018 Cystoscopy TURBT <2 cm - 10/31/2018 Cystoscopy TURBT >5 cm - 03/18/2018 Cystoscopy TURBT 2-5 cm - 02/11/2018, Right - 12/24/2017 Cystoscopy Ureteroscopy, Right - 2019 Locm  300-399Mg /Ml Iodine,1Ml - 10/15/2018, 11/19/2017, 2019 Radical Prostatectomy     NON-GU PSH: Cataract surgery, Bilateral - 11/08/2017 Coronary Artery Bypass Grafting     GU PMH: Gross hematuria - 01/06/2020, - 12/30/2019, - 12/10/2017, - 11/08/2017, - 2018 Renal pelvis cancer, right - 01/06/2020, - 12/30/2019, - 12/09/2019, - 2018 Bladder Cancer overlapping sites - 01/08/2018 History of kidney cancer - 2018 Prostate Cancer - 2018 Right renal neoplasm - 2018      PMH Notes:   1) Renal cell carcinoma: He is s/p an open left radical nephrectomy at [facility] in 1994.   2) Prostate cancer: He is s/p an open RRP at [facility] in 1994. He apparently had a biochemical recurrence soon after primary therapy and has been on systemic therapy with ADT since then under his urologist in [place].   3) Urothelial carcinoma of the right kidney: He was found to have a right renal collecting system tumor on imaging in 2018 in his solitary right kidney. This was confirmed on ureteroscopic evaluation to be high grade urothelial carcinoma.   Aug 2018: Right ureteroscopy with laser ablation of right renal pelvic tumor - pathology indicated high-grade urothelial carcinoma  Sep 2018: Repeat right ureteroscopy with laser ablation of remaining right renal pelvic tumor - pathology confirmed high-grade urothelial carcinoma with a suspicious focus of lamina propria invasion  Nov 2018: Repeat right ureteroscopy - Minimal tumor remaining and completely laser ablated  May 2019: Cystoscopy with surveillance right ureteroscopy - Negative  Oct 2019: He develop recurrent hematuria, right ureteroscopy with laser ablation of small tumor in lower pole and TURBT of bladder tumor - High grade T1a urothelial carcinoma  Dec 2019: Recurrent hematuria and recurrent tumor  Dec 2019: TURBT - Multifocal high grade, T1 urothelial carcinoma  Jan 2020: TURBT - Recurrent multifocal high grade, T1  urothelial carcinoma, post-op  intravesical gemcitabine  Feb-Apr 2020: 6 week induction BCG  May 2020: Positive cytology (no visible tumors)  Jun-Jul 2020: Repeat 6 week induction BCG  Sep 2020: Right ureteroscopic laser ablation of 1.5 cm lower pole tumor, TURBT small left 1 cm bladder tumor- high grade Ta  Oct 2020: Right ureteroscopy with completion ablation of right renal pelvic tumor  Nov 2020: 3 week maintenance BCG  Aug 2021: TURBT and right ureteroscopy - Laser ablation of tumor in lower pole but incomplete  Sep 2021: R ureteroscopic laser tumor ablation - biopsies with scant residual high grade tumor and necrotic tumor (likely ablated most if not all of tumor)   4) Chronic bacteruria:      NON-GU PMH: Bacteriuria - 12/30/2019, (Stable), - 12/09/2019 (Stable), - 12/17/2018 (Stable), - 11/13/2018, - 2018 Pyuria/other UA findings - 04/10/2018 Coronary Artery Disease Sleep Apnea    FAMILY HISTORY: 1 Daughter - Runs in Family 2 sons - Runs in Family Cirrhosis - Father Death - Father, Mother   SOCIAL HISTORY: Marital Status: Married Preferred Language: English; Ethnicity: Not Hispanic Or Latino; Race: White Current Smoking Status: Patient has never smoked.   Tobacco Use Assessment Completed: Used Tobacco in last 30 days? Has never drank.  Drinks 1 caffeinated drink per day.    REVIEW OF SYSTEMS:    GU Review Male:   Patient denies frequent urination, hard to postpone urination, burning/ pain with urination, get up at night to urinate, leakage of urine, stream starts and stops, trouble starting your streams, and have to strain to urinate .  Gastrointestinal (Upper):   Patient denies nausea and vomiting.  Gastrointestinal (Lower):   Patient denies diarrhea and constipation.  Constitutional:   Patient denies fever, night sweats, weight loss, and fatigue.  Skin:   Patient denies skin rash/ lesion and itching.  Eyes:   Patient denies blurred vision and double vision.  Ears/ Nose/ Throat:   Patient denies sore  throat and sinus problems.  Hematologic/Lymphatic:   Patient denies easy bruising and swollen glands.  Cardiovascular:   Patient denies leg swelling and chest pains.  Respiratory:   Patient denies cough and shortness of breath.  Endocrine:   Patient denies excessive thirst.  Musculoskeletal:   Patient denies back pain and joint pain.  Neurological:   Patient denies headaches and dizziness.  Psychologic:   Patient denies depression and anxiety.   VITAL SIGNS:      03/10/2020 11:09 AM  Weight 147 lb / 66.68 kg  Height 71 in / 180.34 cm  BP 116/70 mmHg  Pulse 103 /min  Temperature 97.5 F / 36.3 C  BMI 20.5 kg/m   GU PHYSICAL EXAMINATION:    Urethral Meatus: Normal size. No lesion, no wart, no discharge, no polyp. Normal location.   MULTI-SYSTEM PHYSICAL EXAMINATION:    Constitutional: Well-nourished. No physical deformities. Normally developed. Good grooming.     Complexity of Data:  Records Review:   Previous Patient Records   10/01/19 10/17/18 09/27/16  PSA  Total PSA <0.015 ng/mL <0.015 ng/mL <0.015 ng/dL    PROCEDURES:         Flexible Cystoscopy - 52000  Indication: Urothelial carcinoma Risks, benefits, and potential complications of the procedure were discussed with the patient including infection, bleeding, voiding discomfort, urinary retention, fever, chills, sepsis, and others. All questions were answered. Informed consent was obtained. Sterile technique and intraurethral analgesia were used.  Meatus:  Normal size. Normal location. Normal condition.  Urethra:  No strictures.  External Sphincter:  Normal.  Verumontanum:  Verumontanum Surgically Absent.  Prostate:  Prostate Surgically Absent.  Bladder Neck:  Non-obstructing.  Ureteral Orifices:  Normal location. Normal size. Normal shape. Effluxed clear urine.  Bladder:  Moderate to severe trabeculation throughout the bladder. No obvious bladder tumors were identified. Visualization was somewhat obscured by hematuria.  Bladder washing was obtained for cytology.      Chaperone: Alleen Borne The procedure was well-tolerated and without complications. Instructions were given to call the office immediately if questions or problems.   ASSESSMENT:      ICD-10 Details  1 GU:   Renal pelvis cancer, right - C65.1   2   Bladder Cancer overlapping sites - C67.8    PLAN:           Orders Labs Urine Cytology, Urine Culture          Schedule Labs: 3 Months - Urinalysis  Return Visit/Planned Activity: 3 Months - Office Visit, Cystoscopy          Document Letter(s):  Created for Patient: Clinical Summary         Notes:   1. Urothelial carcinoma of the right renal pelvis and bladder: No evidence of cystoscopic recurrence of bladder cancer. A bladder washing has been obtained for cytology. If negative, our tentative plan is to allow him to recover more from his recent knee injury in follow-up in 3 months for surveillance cystoscopy and then to schedule him for surveillance right ureteroscopy with possible laser ablation if recurrent tumor is found. If his cytology is positive, we will plan to do this sooner.   2. Prostate cancer: His PSA will be due to be checked around August of 2022.           I discussed his cytology results which were positive. I recommended proceeding with further endoscopic evaluation at this time including cystoscopy, possible bladder biopsies, right retrograde pyelography, right ureteroscopy with possible laser ablation of tumor, and right ureteral stent as needed. I did recommend that we proceed with this in the near future rather than waiting for repeat office evaluation later in the spring.     * Signed by Raynelle Bring, M.D. on 03/23/20 at 8:13 AM (EST)*

## 2020-04-09 NOTE — Progress Notes (Signed)
CBC and BMP sent to Dr. Alinda Money to review.

## 2020-04-12 ENCOUNTER — Ambulatory Visit (HOSPITAL_COMMUNITY): Payer: Medicare Other | Admitting: Physician Assistant

## 2020-04-12 ENCOUNTER — Other Ambulatory Visit: Payer: Self-pay

## 2020-04-12 ENCOUNTER — Encounter (HOSPITAL_COMMUNITY): Payer: Self-pay | Admitting: Urology

## 2020-04-12 ENCOUNTER — Encounter (HOSPITAL_COMMUNITY)
Admission: RE | Disposition: A | Discharge status: Home / Self Care | Payer: Self-pay | Source: Home / Self Care | Attending: Urology

## 2020-04-12 ENCOUNTER — Ambulatory Visit (HOSPITAL_COMMUNITY)
Admission: RE | Admit: 2020-04-12 | Discharge: 2020-04-12 | Disposition: A | Discharge status: Home / Self Care | Payer: Medicare Other | Attending: Urology | Admitting: Urology

## 2020-04-12 ENCOUNTER — Ambulatory Visit (HOSPITAL_COMMUNITY): Payer: Medicare Other | Admitting: Anesthesiology

## 2020-04-12 ENCOUNTER — Ambulatory Visit (HOSPITAL_COMMUNITY): Payer: Medicare Other

## 2020-04-12 DIAGNOSIS — C678 Malignant neoplasm of overlapping sites of bladder: Secondary | ICD-10-CM | POA: Diagnosis not present

## 2020-04-12 DIAGNOSIS — C651 Malignant neoplasm of right renal pelvis: Secondary | ICD-10-CM | POA: Diagnosis present

## 2020-04-12 DIAGNOSIS — Z951 Presence of aortocoronary bypass graft: Secondary | ICD-10-CM | POA: Diagnosis not present

## 2020-04-12 DIAGNOSIS — C61 Malignant neoplasm of prostate: Secondary | ICD-10-CM | POA: Insufficient documentation

## 2020-04-12 DIAGNOSIS — Z87891 Personal history of nicotine dependence: Secondary | ICD-10-CM | POA: Insufficient documentation

## 2020-04-12 DIAGNOSIS — Z905 Acquired absence of kidney: Secondary | ICD-10-CM | POA: Diagnosis not present

## 2020-04-12 HISTORY — PX: CYSTOSCOPY WITH FULGERATION: SHX6638

## 2020-04-12 LAB — CBC
HCT: 25.8 % — ABNORMAL LOW (ref 39.0–52.0)
Hemoglobin: 8.6 g/dL — ABNORMAL LOW (ref 13.0–17.0)
MCH: 30.6 pg (ref 26.0–34.0)
MCHC: 33.3 g/dL (ref 30.0–36.0)
MCV: 91.8 fL (ref 80.0–100.0)
Platelets: 173 10*3/uL (ref 150–400)
RBC: 2.81 MIL/uL — ABNORMAL LOW (ref 4.22–5.81)
RDW: 16.4 % — ABNORMAL HIGH (ref 11.5–15.5)
WBC: 7.7 10*3/uL (ref 4.0–10.5)
nRBC: 0 % (ref 0.0–0.2)

## 2020-04-12 SURGERY — CYSTOSCOPY, WITH BLADDER FULGURATION
Anesthesia: General

## 2020-04-12 MED ORDER — EPHEDRINE SULFATE-NACL 50-0.9 MG/10ML-% IV SOSY
PREFILLED_SYRINGE | INTRAVENOUS | Status: DC | PRN
  Administered 2020-04-12: 5 mg via INTRAVENOUS
  Administered 2020-04-12: 10 mg via INTRAVENOUS
  Administered 2020-04-12 (×2): 5 mg via INTRAVENOUS
  Administered 2020-04-12: 10 mg via INTRAVENOUS

## 2020-04-12 MED ORDER — LIDOCAINE HCL (PF) 2 % IJ SOLN
INTRAMUSCULAR | Status: AC
  Filled 2020-04-12: qty 5

## 2020-04-12 MED ORDER — ACETAMINOPHEN 500 MG PO TABS
1000.0000 mg | ORAL_TABLET | Freq: Once | ORAL | Status: AC
  Administered 2020-04-12: 1000 mg via ORAL
  Filled 2020-04-12: qty 2

## 2020-04-12 MED ORDER — FENTANYL CITRATE (PF) 100 MCG/2ML IJ SOLN
INTRAMUSCULAR | Status: DC | PRN
  Administered 2020-04-12: 25 ug via INTRAVENOUS

## 2020-04-12 MED ORDER — PROPOFOL 10 MG/ML IV BOLUS
INTRAVENOUS | Status: AC
  Filled 2020-04-12: qty 40

## 2020-04-12 MED ORDER — ORAL CARE MOUTH RINSE
15.0000 mL | Freq: Once | OROMUCOSAL | Status: AC
  Administered 2020-04-12: 15 mL via OROMUCOSAL

## 2020-04-12 MED ORDER — PROPOFOL 10 MG/ML IV BOLUS
INTRAVENOUS | Status: DC | PRN
  Administered 2020-04-12: 50 mg via INTRAVENOUS
  Administered 2020-04-12: 100 mg via INTRAVENOUS
  Administered 2020-04-12: 50 mg via INTRAVENOUS

## 2020-04-12 MED ORDER — ONDANSETRON HCL 4 MG/2ML IJ SOLN
INTRAMUSCULAR | Status: AC
  Filled 2020-04-12: qty 2

## 2020-04-12 MED ORDER — DEXAMETHASONE SODIUM PHOSPHATE 10 MG/ML IJ SOLN
INTRAMUSCULAR | Status: AC
  Filled 2020-04-12: qty 1

## 2020-04-12 MED ORDER — ONDANSETRON HCL 4 MG/2ML IJ SOLN
INTRAMUSCULAR | Status: DC | PRN
  Administered 2020-04-12: 4 mg via INTRAVENOUS

## 2020-04-12 MED ORDER — FENTANYL CITRATE (PF) 100 MCG/2ML IJ SOLN: 25.0000 ug | INTRAMUSCULAR | Status: DC | PRN

## 2020-04-12 MED ORDER — DEXAMETHASONE SODIUM PHOSPHATE 10 MG/ML IJ SOLN
INTRAMUSCULAR | Status: DC | PRN
  Administered 2020-04-12: 4 mg via INTRAVENOUS

## 2020-04-12 MED ORDER — IOHEXOL 300 MG/ML  SOLN
INTRAMUSCULAR | Status: DC | PRN
  Administered 2020-04-12: 9 mL

## 2020-04-12 MED ORDER — FENTANYL CITRATE (PF) 100 MCG/2ML IJ SOLN
INTRAMUSCULAR | Status: AC
  Filled 2020-04-12: qty 2

## 2020-04-12 MED ORDER — LACTATED RINGERS IV SOLN: INTRAVENOUS | Status: DC

## 2020-04-12 MED ORDER — SODIUM CHLORIDE 0.9 % IR SOLN
Status: DC | PRN
  Administered 2020-04-12: 6000 mL

## 2020-04-12 MED ORDER — 0.9 % SODIUM CHLORIDE (POUR BTL) OPTIME
TOPICAL | Status: DC | PRN
  Administered 2020-04-12: 1000 mL

## 2020-04-12 MED ORDER — LIDOCAINE 2% (20 MG/ML) 5 ML SYRINGE
INTRAMUSCULAR | Status: DC | PRN
  Administered 2020-04-12: 80 mg via INTRAVENOUS

## 2020-04-12 MED ORDER — CHLORHEXIDINE GLUCONATE 0.12 % MT SOLN: 15.0000 mL | Freq: Once | OROMUCOSAL | Status: AC

## 2020-04-12 MED ORDER — SODIUM CHLORIDE 0.9 % IV SOLN
2.0000 g | Freq: Once | INTRAVENOUS | Status: AC
  Administered 2020-04-12: 2 g via INTRAVENOUS
  Filled 2020-04-12: qty 20

## 2020-04-12 SURGICAL SUPPLY — 23 items
BAG DRN RND TRDRP ANRFLXCHMBR (UROLOGICAL SUPPLIES)
BAG URINE DRAIN 2000ML AR STRL (UROLOGICAL SUPPLIES) IMPLANT
BAG URO CATCHER STRL LF (MISCELLANEOUS) ×2 IMPLANT
BASKET ZERO TIP NITINOL 2.4FR (BASKET) ×1 IMPLANT
BSKT STON RTRVL ZERO TP 2.4FR (BASKET) ×1
CATH INTERMIT  6FR 70CM (CATHETERS) ×1 IMPLANT
ELECT REM PT RETURN 15FT ADLT (MISCELLANEOUS) ×2 IMPLANT
FIBER LASER TRAC TIP (UROLOGICAL SUPPLIES) ×1 IMPLANT
GLOVE SURG ENC TEXT LTX SZ7.5 (GLOVE) ×2 IMPLANT
GOWN STRL REUS W/TWL LRG LVL3 (GOWN DISPOSABLE) ×2 IMPLANT
GUIDEWIRE STR DUAL SENSOR (WIRE) ×1 IMPLANT
KIT TURNOVER KIT A (KITS) ×2 IMPLANT
LOOP CUT BIPOLAR 24F LRG (ELECTROSURGICAL) IMPLANT
MANIFOLD NEPTUNE II (INSTRUMENTS) ×2 IMPLANT
PACK CYSTO (CUSTOM PROCEDURE TRAY) ×2 IMPLANT
PAD TELFA 2X3 NADH STRL (GAUZE/BANDAGES/DRESSINGS) ×1 IMPLANT
PENCIL SMOKE EVACUATOR (MISCELLANEOUS) IMPLANT
SHEATH URETERAL 12FRX35CM (MISCELLANEOUS) ×1 IMPLANT
STENT URET 6FRX24 CONTOUR (STENTS) ×1 IMPLANT
SYR TOOMEY IRRIG 70ML (MISCELLANEOUS)
SYRINGE TOOMEY IRRIG 70ML (MISCELLANEOUS) IMPLANT
TUBING CONNECTING 10 (TUBING) ×2 IMPLANT
TUBING UROLOGY SET (TUBING) ×1 IMPLANT

## 2020-04-12 NOTE — Op Note (Signed)
Preoperative diagnosis: 1.  History of urothelial carcinoma of the right renal collecting system and bladder 2.  Solitary right kidney  Postoperative diagnosis: 1.  Urothelial carcinoma of the right renal collecting system 2.  Solitary right kidney  Procedures: 1.  Cystoscopy 2.  Right retrograde pyelography with interpretation 3.  Right ureteroscopy with removal of tumor and laser ablation of tumor 4.  Right ureteral stent (6 x 24 -no string)  Surgeon: Roxy Horseman, Brooke Bonito MD  Anesthesia: General  Complications: None  EBL: Minimal  Specimens: Right renal pelvic tumor  Disposition of specimen: Pathology  Intraoperative findings: Right retrograde pyelography was performed with Omnipaque contrast and a 6 French ureteral catheter.  This revealed a normal ureter without filling defects.  The right renal collecting system did not demonstrate any clear filling defects but the lower pole calyx was not completely filled out.  Indication: Wesley Snyder is an 87 year old gentleman with a history of urothelial carcinoma of his bladder and right renal pelvis.  He has a solitary right kidney and despite presence of high-grade disease, he has refused to undergo nephrectomy.  As such, we have been proceeding with endoscopic treatment.  He recently presented for surveillance cystoscopy and did not have evidence of tumor recurrence in the bladder.  However, a bladder washing for cytology was positive.  He presents today for further endoscopic evaluation and treatment as indicated.  The potential risks, complications, and the expected recovery process associated with the above procedures were discussed in detail.  Informed consent was obtained.  Description of procedure: The patient was taken to the operating room and a general anesthetic was administered.  He was given preoperative antibiotics, placed in the dorsolithotomy position, and prepped and draped in the usual sterile fashion.  Next, a preoperative  timeout was performed.  Cystourethroscopy was then performed with a 21 French cystoscope sheath.  Inspection of the bladder was performed systematically no bladder tumors or other coastal abnormalities were identified except for previous resection sites.  Attention then turned to the solitary right ureteral orifice.  This was intubated with a 6 French ureteral catheter.  Omnipaque contrast was injected.  This demonstrated no ureteral filling defects.  The lower pole right renal calyx was not filled out.  A 0.38 sensor guidewire was then advanced up into the right renal collecting system under fluoroscopic guidance.  A 12/14 ureteral access sheath was then advanced over the wire into the proximal ureter.  Flexible digital ureteroscopy was then performed.  Inspection of the right renal collecting system did reveal what appeared to be recurrent tumor growth in the lower pole calyx as has been previously noted.  There was a large amount of clot along with tumor.  A 0 tip nitinol basket was used to grab a large amount of clot and tumor on multiple passes allowing a large amount of clot and tumor to be removed.  This was sent for pathologic analysis.  Once visualization was poor and no progress was made with tumor removal in this fashion, attention turned to laser ablation.  Visualization was improved with the basket removed.  Using a 200 m holmium laser fiber, the lower pole tumor was then ablated on a setting of 1.5 J and 8 Hz.  This was ablated until the laser fiber had exceeded its lifespan.  There did not appear to be any clearly viable tumor although it was felt that this ablation was likely incomplete considering the large amount of tumor that was noted.  The ureteroscope  was then withdrawn and the access sheath was removed leaving the 0.38 sensor guidewire in place.  The wire was then backloaded on the cystoscope and a 6 x 24 double-J ureteral stent was advanced over the wire using Seldinger technique and  positioned under fluoroscopic and cystoscopic guidance.  The wire was removed with a good curl noted in the renal pelvis as well as within the bladder.  The bladder was emptied and the procedure was ended.  He tolerated the procedure well without complications.  He was able to be awakened and transferred to recovery unit in satisfactory condition.

## 2020-04-12 NOTE — Anesthesia Procedure Notes (Signed)
Procedure Name: LMA Insertion Date/Time: 04/12/2020 12:01 PM Performed by: Niel Hummer, CRNA Pre-anesthesia Checklist: Patient identified, Emergency Drugs available, Suction available and Patient being monitored Patient Re-evaluated:Patient Re-evaluated prior to induction Oxygen Delivery Method: Circle system utilized Preoxygenation: Pre-oxygenation with 100% oxygen Induction Type: IV induction LMA: LMA with gastric port inserted LMA Size: 4.0 Number of attempts: 1 Dental Injury: Teeth and Oropharynx as per pre-operative assessment

## 2020-04-12 NOTE — Anesthesia Preprocedure Evaluation (Addendum)
Anesthesia Evaluation  Patient identified by MRN, date of birth, ID band Patient awake    Reviewed: Allergy & Precautions, NPO status , Patient's Chart, lab work & pertinent test results  History of Anesthesia Complications Negative for: history of anesthetic complications  Airway Mallampati: II  TM Distance: >3 FB Neck ROM: Full    Dental  (+) Edentulous Upper, Edentulous Lower, Dental Advisory Given   Pulmonary shortness of breath, former smoker,    Pulmonary exam normal        Cardiovascular hypertension, + CAD  Normal cardiovascular exam     Neuro/Psych negative neurological ROS  negative psych ROS   GI/Hepatic negative GI ROS, Neg liver ROS,   Endo/Other  negative endocrine ROS  Renal/GU Renal InsufficiencyRenal diseaseS/P L nephrectomy  negative genitourinary   Musculoskeletal  (+) Arthritis , Osteoarthritis,    Abdominal   Peds  Hematology negative hematology ROS (+)   Anesthesia Other Findings   Reproductive/Obstetrics                            Anesthesia Physical  Anesthesia Plan  ASA: III  Anesthesia Plan: General   Post-op Pain Management:    Induction: Intravenous  PONV Risk Score and Plan: 4 or greater and Ondansetron, Dexamethasone and Treatment may vary due to age or medical condition  Airway Management Planned: Oral ETT and LMA  Additional Equipment:   Intra-op Plan:   Post-operative Plan: Extubation in OR  Informed Consent: I have reviewed the patients History and Physical, chart, labs and discussed the procedure including the risks, benefits and alternatives for the proposed anesthesia with the patient or authorized representative who has indicated his/her understanding and acceptance.     Dental advisory given  Plan Discussed with: Anesthesiologist and CRNA  Anesthesia Plan Comments:        Anesthesia Quick Evaluation

## 2020-04-12 NOTE — Transfer of Care (Signed)
Immediate Anesthesia Transfer of Care Note  Patient: Wesley Snyder.  Procedure(s) Performed: CYSTOSCOPY, RIGHT RETROGRADE, RIGHT URETEROSCOPY, HOLMIUM LASER RIGHT URETERAL TUMOR, RT URETERAL STENT PLACEMENT (N/A )  Patient Location: PACU  Anesthesia Type:General  Level of Consciousness: awake, alert  and oriented  Airway & Oxygen Therapy: Patient Spontanous Breathing and Patient connected to face mask oxygen  Post-op Assessment: Report given to RN, Post -op Vital signs reviewed and stable and Patient moving all extremities X 4  Post vital signs: Reviewed and stable  Last Vitals:  Vitals Value Taken Time  BP    Temp    Pulse    Resp    SpO2      Last Pain:  Vitals:   04/12/20 1118  TempSrc:   PainSc: 0-No pain         Complications: No complications documented.

## 2020-04-12 NOTE — Interval H&P Note (Signed)
History and Physical Interval Note:  04/12/2020 11:26 AM  Wesley Snyder.  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  The various methods of treatment have been discussed with the patient and family. After consideration of risks, benefits and other options for treatment, the patient has consented to  Procedure(s): CYSTOSCOPY WITH FULGERATION/ RETROGRADE/ RIGHT URETEROSCOPY/ BLADDER BIOPSY/ RT URETERAL STENT (N/A) as a surgical intervention.  The patient's history has been reviewed, patient examined, no change in status, stable for surgery.  I have reviewed the patient's chart and labs.  Questions were answered to the patient's satisfaction.     [employer]

## 2020-04-12 NOTE — Anesthesia Postprocedure Evaluation (Signed)
Anesthesia Post Note  Patient: Fransico Sciandra.  Procedure(s) Performed: CYSTOSCOPY, RIGHT RETROGRADE, RIGHT URETEROSCOPY, HOLMIUM LASER RIGHT URETERAL TUMOR, RT URETERAL STENT PLACEMENT (N/A )     Patient location during evaluation: PACU Anesthesia Type: General Level of consciousness: sedated Pain management: pain level controlled Vital Signs Assessment: post-procedure vital signs reviewed and stable Respiratory status: spontaneous breathing and respiratory function stable Cardiovascular status: stable Postop Assessment: no apparent nausea or vomiting Anesthetic complications: no   No complications documented.  Last Vitals:  Vitals:   04/12/20 1445 04/12/20 1500  BP: 125/67 (!) 148/63  Pulse: 60 62  Resp: 14 18  Temp: (!) 36.4 C (!) 36.4 C  SpO2: 99% 100%    Last Pain:  Vitals:   04/12/20 1500  TempSrc:   PainSc: 0-No pain                 Nekeisha Aure DANIEL

## 2020-04-12 NOTE — Discharge Instructions (Signed)

## 2020-04-13 ENCOUNTER — Encounter (HOSPITAL_COMMUNITY): Payer: Self-pay | Admitting: Urology

## 2020-04-13 LAB — SURGICAL PATHOLOGY

## 2020-05-03 ENCOUNTER — Other Ambulatory Visit: Payer: Self-pay | Admitting: Urology

## 2020-05-17 NOTE — Patient Instructions (Addendum)
DUE TO COVID-19 ONLY ONE VISITOR IS ALLOWED TO COME WITH YOU AND STAY IN THE WAITING ROOM ONLY DURING PRE OP AND PROCEDURE DAY OF SURGERY. THE 1 VISITOR  MAY VISIT WITH YOU AFTER SURGERY IN YOUR PRIVATE ROOM DURING VISITING HOURS ONLY!  YOU NEED TO HAVE A COVID 19 TEST ON: 05/24/20 @ 11:00 AM , THIS TEST MUST BE DONE BEFORE SURGERY,  COVID TESTING SITE [place] [id number], IT IS ON THE RIGHT GOING OUT [address] APPROXIMATELY  2 MINUTES PAST [employer] ON THE RIGHT. ONCE YOUR COVID TEST IS COMPLETED,  PLEASE BEGIN THE QUARANTINE INSTRUCTIONS AS OUTLINED IN YOUR HANDOUT.                Wesley Snyder.   Your procedure is scheduled on: 05/27/20   Report to [facility] Main  Entrance   Report to admitting at: 10:45 AM     Call this number if you have problems the morning of surgery [phone]    Remember: Do not eat solid food :After Midnight. Clear liquids until: 9:45 am.  CLEAR LIQUID DIET  Foods Allowed                                                                     Foods Excluded  Coffee and tea, regular and decaf                             liquids that you cannot  Plain Jell-O any favor except red or purple                                           see through such as: Fruit ices (not with fruit pulp)                                     milk, soups, orange juice  Iced Popsicles                                    All solid food Carbonated beverages, regular and diet                                    Cranberry, grape and apple juices Sports drinks like Gatorade Lightly seasoned clear broth or consume(fat free) Sugar, honey syrup  Sample Menu Breakfast                                Lunch                                     Supper Cranberry juice  Beef broth                            Chicken broth Jell-O                                     Grape juice                           Apple juice Coffee or tea                         Jell-O                                      Popsicle                                                Coffee or tea                        Coffee or tea  _____________________________________________________________________  BRUSH YOUR TEETH MORNING OF SURGERY AND RINSE YOUR MOUTH OUT, NO CHEWING GUM CANDY OR MINTS.                               You may not have any metal on your body including hair pins and              piercings  Do not wear jewelry, lotions, powders or perfumes, deodorant             Men may shave face and neck.   Do not bring valuables to the hospital. [place].  Contacts, dentures or bridgework may not be worn into surgery.  Leave suitcase in the car. After surgery it may be brought to your room.     Patients discharged the day of surgery will not be allowed to drive home. IF YOU ARE HAVING SURGERY AND GOING HOME THE SAME DAY, YOU MUST HAVE AN ADULT TO DRIVE YOU HOME AND BE WITH YOU FOR 24 HOURS. YOU MAY GO HOME BY TAXI OR UBER OR ORTHERWISE, BUT AN ADULT MUST ACCOMPANY YOU HOME AND STAY WITH YOU FOR 24 HOURS.  Name and phone number of your driver:  Special Instructions: N/A              Please read over the following fact sheets you were given: _____________________________________________________________________          [facility] - Preparing for Surgery Before surgery, you can play an important role.  Because skin is not sterile, your skin needs to be as free of germs as possible.  You can reduce the number of germs on your skin by washing with CHG (chlorahexidine gluconate) soap before surgery.  CHG is an antiseptic cleaner which kills germs and bonds with the skin to continue killing germs even after washing. Please DO NOT use if you have an allergy to CHG or antibacterial soaps.  If your skin becomes reddened/irritated stop using the CHG and inform your nurse when you arrive at Short Stay. Do  not shave (including legs and underarms) for at least 48 hours prior to the first CHG shower.  You may shave your face/neck. Please follow these instructions carefully:  1.  Shower with CHG Soap the night before surgery and the  morning of Surgery.  2.  If you choose to wash your hair, wash your hair first as usual with your  normal  shampoo.  3.  After you shampoo, rinse your hair and body thoroughly to remove the  shampoo.                           4.  Use CHG as you would any other liquid soap.  You can apply chg directly  to the skin and wash                       Gently with a scrungie or clean washcloth.  5.  Apply the CHG Soap to your body ONLY FROM THE NECK DOWN.   Do not use on face/ open                           Wound or open sores. Avoid contact with eyes, ears mouth and genitals (private parts).                       Wash face,  Genitals (private parts) with your normal soap.             6.  Wash thoroughly, paying special attention to the area where your surgery  will be performed.  7.  Thoroughly rinse your body with warm water from the neck down.  8.  DO NOT shower/wash with your normal soap after using and rinsing off  the CHG Soap.                9.  Pat yourself dry with a clean towel.            10.  Wear clean pajamas.            11.  Place clean sheets on your bed the night of your first shower and do not  sleep with pets. Day of Surgery : Do not apply any lotions/deodorants the morning of surgery.  Please wear clean clothes to the hospital/surgery center.  FAILURE TO FOLLOW THESE INSTRUCTIONS MAY RESULT IN THE CANCELLATION OF YOUR SURGERY PATIENT SIGNATURE_________________________________  NURSE SIGNATURE__________________________________  ________________________________________________________________________

## 2020-05-18 ENCOUNTER — Other Ambulatory Visit: Payer: Self-pay

## 2020-05-18 ENCOUNTER — Encounter (HOSPITAL_COMMUNITY)
Admission: RE | Admit: 2020-05-18 | Discharge: 2020-05-18 | Disposition: A | Discharge status: Home / Self Care | Payer: Medicare Other | Source: Ambulatory Visit | Attending: Urology | Admitting: Urology

## 2020-05-18 ENCOUNTER — Encounter (HOSPITAL_COMMUNITY): Payer: Self-pay

## 2020-05-18 DIAGNOSIS — Z01812 Encounter for preprocedural laboratory examination: Secondary | ICD-10-CM | POA: Diagnosis not present

## 2020-05-18 HISTORY — DX: Depression, unspecified: F32.A

## 2020-05-18 LAB — BASIC METABOLIC PANEL
Anion gap: 11 (ref 5–15)
BUN: 46 mg/dL — ABNORMAL HIGH (ref 8–23)
CO2: 16 mmol/L — ABNORMAL LOW (ref 22–32)
Calcium: 9.4 mg/dL (ref 8.9–10.3)
Chloride: 112 mmol/L — ABNORMAL HIGH (ref 98–111)
Creatinine, Ser: 4.53 mg/dL — ABNORMAL HIGH (ref 0.61–1.24)
GFR, Estimated: 12 mL/min — ABNORMAL LOW (ref >60)
Glucose, Bld: 94 mg/dL (ref 70–99)
Potassium: 5.6 mmol/L — ABNORMAL HIGH (ref 3.5–5.1)
Sodium: 139 mmol/L (ref 135–145)

## 2020-05-18 LAB — CBC
HCT: 21.1 % — ABNORMAL LOW (ref 39.0–52.0)
Hemoglobin: 6.7 g/dL — CL (ref 13.0–17.0)
MCH: 27.8 pg (ref 26.0–34.0)
MCHC: 31.8 g/dL (ref 30.0–36.0)
MCV: 87.6 fL (ref 80.0–100.0)
Platelets: 203 10*3/uL (ref 150–400)
RBC: 2.41 MIL/uL — ABNORMAL LOW (ref 4.22–5.81)
RDW: 14 % (ref 11.5–15.5)
WBC: 7.2 10*3/uL (ref 4.0–10.5)
nRBC: 0 % (ref 0.0–0.2)

## 2020-05-18 NOTE — Progress Notes (Addendum)
CRITICAL RESULT PROVIDER NOTIFICATION  Test performed and critical result: Hgb: 6.7  Date and time result received:  05/18/20  Provider name/title: Dr. Raynelle Bring  Date and time provider notified: 05/18/20  Date and time provider responded:   Provider response:

## 2020-05-18 NOTE — Progress Notes (Signed)
Dr. Lynne Logan office was contacted,message about critical level was informed to [facility].

## 2020-05-18 NOTE — Progress Notes (Signed)
Lab results: Cr: 4.53 K: 5.6 HCT: 21.1

## 2020-05-18 NOTE — Progress Notes (Signed)
Made Louann Sjogren RN aware of Hgb 6.7 called from the lab.

## 2020-05-18 NOTE — Progress Notes (Signed)
COVID Vaccine Completed: Date COVID Vaccine completed: COVID vaccine manufacturer: [place]   PCP -  Cardiologist -   Chest x-ray -  EKG -  Stress Test -  ECHO -  Cardiac Cath -  Pacemaker/ICD device last checked:  Sleep Study -  CPAP -   Fasting Blood Sugar -  Checks Blood Sugar _____ times a day  Blood Thinner Instructions: Aspirin Instructions: Last Dose:  Anesthesia review:   Patient denies shortness of breath, fever, cough and chest pain at PAT appointment   Patient verbalized understanding of instructions that were given to them at the PAT appointment. Patient was also instructed that they will need to review over the PAT instructions again at home before surgery.COVID Vaccine Completed: Yes Date COVID Vaccine completed: 05/2019 COVID vaccine manufacturer:    Moderna     PCP - Majel Homer: PA Cardiologist -   Chest x-ray -  EKG - 10/14/19 Stress Test -  ECHO -  Cardiac Cath -  Pacemaker/ICD device last checked:  Sleep Study -  CPAP -   Fasting Blood Sugar -  Checks Blood Sugar _____ times a day  Blood Thinner Instructions: Aspirin Instructions: Last Dose:  Anesthesia review:   Patient denies shortness of breath, fever, cough and chest pain at PAT appointment   Patient verbalized understanding of instructions that were given to them at the PAT appointment. Patient was also instructed that they will need to review over the PAT instructions again at home before surgery.

## 2020-05-24 ENCOUNTER — Other Ambulatory Visit (HOSPITAL_COMMUNITY)
Admission: RE | Admit: 2020-05-24 | Discharge: 2020-05-24 | Disposition: A | Discharge status: Home / Self Care | Payer: Medicare Other | Source: Ambulatory Visit | Attending: Urology | Admitting: Urology

## 2020-05-24 DIAGNOSIS — Z01812 Encounter for preprocedural laboratory examination: Secondary | ICD-10-CM | POA: Insufficient documentation

## 2020-05-24 DIAGNOSIS — Z20822 Contact with and (suspected) exposure to covid-19: Secondary | ICD-10-CM | POA: Insufficient documentation

## 2020-05-24 LAB — SARS CORONAVIRUS 2 (TAT 6-24 HRS): SARS Coronavirus 2: NEGATIVE

## 2020-05-26 ENCOUNTER — Other Ambulatory Visit: Payer: Self-pay | Admitting: Urology

## 2020-05-26 NOTE — Progress Notes (Signed)
Anesthesia Chart Review   Case: [id number] Date/Time: 05/27/20 1205   Procedure: CYSTOSCOPY / RETROGRADE/URETEROSCOPY / LASER ABLATION OF TUMOR AND STENT PLACEMENT (Right )   Anesthesia type: General   Pre-op diagnosis: UROTHELIAL CARCINOMA OF THE RIGHT RENAL PELVIS   Location: [facility]   Surgeons: Raynelle Bring, MD      DISCUSSION:87 y.o. former smoker with h/o HTN, CAD, CKD, s/p left radical nephrectomy 1994, urothelial carcinoma of the right renal pelvis scheduled for above procedure 05/27/2020 with Dr. Raynelle Bring.   Hemoglobin 6.7 at PAT visit, reported to surgeon by PAT nurse. I spoke with Dr. Lynne Logan surgical coordinator who reports pt has received a transfusion.  Will recheck labs DOS.  Potassium elevated as well.  Will repeat DOS. Per Dr. Lynne Logan H&P, "His preoperative hemoglobin was noted to be 6.7 yesterday. He went to the emergency room in [place] and received 3 units of red blood cells. I will plan to recheck his hemoglobin the day of his surgery next week."   S/p multiple procedures over the last few years with no anesthesia complications noted.  VS: BP 128/70   Pulse 61   Temp 36.7 C (Oral)   Ht 5\' 10"  (1.778 m)   Wt 63 kg   SpO2 100%   BMI 19.94 kg/m   PROVIDERS: Majel Homer, PA is PCP    LABS: Critical Lab, called into surgeon (all labs ordered are listed, but only abnormal results are displayed)  Labs Reviewed  CBC - Abnormal; Notable for the following components:      Result Value   RBC 2.41 (*)    Hemoglobin 6.7 (*)    HCT 21.1 (*)    All other components within normal limits  BASIC METABOLIC PANEL - Abnormal; Notable for the following components:   Potassium 5.6 (*)    Chloride 112 (*)    CO2 16 (*)    BUN 46 (*)    Creatinine, Ser 4.53 (*)    GFR, Estimated 12 (*)    All other components within normal limits     IMAGES:   EKG: 10/14/2019 Rate 56 bpm  Sinus bradycardia Left axis deviation Right bundle branch  block Abnormal ECG No significant change since last tracing  CV:  Past Medical History:  Diagnosis Date  . Anemia   . Anxiety   . Arthritis   . Cancer [facility])    prostate ca and Left kidney and right kidney, bladder  . Chronic kidney disease   . Coronary artery disease   . Depression   . Dyspnea    Not currently  . Hypertension    pt takes no medications  . Skin abrasion    left arm wood fell on it 1 week ago    Past Surgical History:  Procedure Laterality Date  . CHOLECYSTECTOMY    . CORONARY ARTERY BYPASS GRAFT     2009, 5 vessel  . CYSTOSCOPY W/ URETERAL STENT PLACEMENT Right 12/09/2018   Procedure: CYSTOSCOPY WITH RETROGRADE PYELOGRAM/URETEROSCOPY WITH LASER ABLATION OF TUMOR/ URETERAL STENT PLACEMENT;  Surgeon: Raynelle Bring, MD;  Location: [facility];  Service: Urology;  Laterality: Right;  . CYSTOSCOPY WITH FULGERATION N/A 11/16/2016   Procedure: CYSTOSCOPY WITH FULGERATION WITH HOLMIUM LASER;  Surgeon: Raynelle Bring, MD;  Location: [facility];  Service: Urology;  Laterality: N/A;  . CYSTOSCOPY WITH FULGERATION N/A 04/12/2020   Procedure: CYSTOSCOPY, RIGHT RETROGRADE, RIGHT URETEROSCOPY, HOLMIUM LASER RIGHT URETERAL TUMOR, RT URETERAL STENT PLACEMENT;  Surgeon: Raynelle Bring,  MD;  Location: [facility];  Service: Urology;  Laterality: N/A;  . CYSTOSCOPY WITH RETROGRADE PYELOGRAM, URETEROSCOPY AND STENT PLACEMENT Right 10/16/2019   Procedure: CYSTOSCOPY WITH RIGHT RETROGRADE PYELOGRAM, URETEROSCOPY AND STENT PLACEMENT;  Surgeon: Raynelle Bring, MD;  [facility]: [facility];  Service: Urology;  Laterality: Right;  . CYSTOSCOPY WITH URETEROSCOPY AND STENT PLACEMENT N/A 11/16/2016   Procedure: CYSTOSCOPY WITH URETEROSCOPY AND STENT PLACEMENT;  Surgeon: Raynelle Bring, MD;  [facility];  Service: Urology;  Laterality: N/A;  . CYSTOSCOPY/RETROGRADE/URETEROSCOPY Right 10/23/2016   Procedure: CYSTOSCOPY/RETROGRADE/URETEROSCOPY/STENT/ BIOPSY/ HOLMIUM LASER/ ABLATION OF TUMOR;  Surgeon: Raynelle Bring, MD;  [facility]: [facility];  Service: Urology;  Laterality: Right;  . CYSTOSCOPY/RETROGRADE/URETEROSCOPY Right 07/05/2017   Procedure: CYSTOSCOPY/RETROGRADE/URETEROSCOPY/ STENT PLACEMENT;  Surgeon: Raynelle Bring, MD;  [facility];  Service: Urology;  Laterality: Right;  . CYSTOSCOPY/RETROGRADE/URETEROSCOPY Right 11/24/2019   Procedure: CYSTOSCOPY/RETROGRADE/URETEROSCOPY/ URETEROSCOPIC LASER ABLATION OF TUMOR, URETERAL STENT;  Surgeon: Raynelle Bring, MD;  [facility]: [facility];  Service: Urology;  Laterality: Right;  . CYSTOSCOPY/URETEROSCOPY/HOLMIUM LASER Right 01/11/2017   Procedure: CYSTOSCOPY/RIGHT URETEROSCOPY/HOLMIUM LASER ABLATION OF RIGHT RENAL PELVIC TUMOR/ RIGHT URETERAL STENT PLACEMENT/ FULGARATION OF RIGHT RENAL PELVIS;  Surgeon: Raynelle Bring, MD;  Location: [facility];  Service: Urology;  Laterality: Right;  . EYE SURGERY Bilateral 2018   catarcats   . HOLMIUM LASER APPLICATION  1/61/0960   Procedure: HOLMIUM LASER APPLICATION laser ablation right renal pelvic tumor and biopsy;  Surgeon: Raynelle Bring, MD;  Location: [facility];  Service: Urology;;  . Left kidney removed    . PROSTATECTOMY    . TRANSURETHRAL RESECTION OF BLADDER TUMOR Right 12/24/2017   Procedure: TRANSURETHRAL RESECTION OF BLADDER TUMOR (TURBT) WITH CYSTOSCOPY, URETEROSCOPY, LASER ABLATION, URETERAL STENT PLACEMENT;  Surgeon: Raynelle Bring, MD;  [facility];  Service: Urology;  Laterality: Right;  GENERAL ANESTHESIA WITH PARALYSIS  . TRANSURETHRAL RESECTION OF BLADDER TUMOR N/A 02/11/2018   Procedure: TRANSURETHRAL RESECTION OF BLADDER TUMOR (TURBT) WITH CYSTOSCOPY;  Surgeon: Raynelle Bring, MD;  [facility];  Service: Urology;  Laterality: N/A;  GENERAL ANESTHESIA WITH PARALYSIS  . TRANSURETHRAL RESECTION OF BLADDER TUMOR N/A 03/18/2018   Procedure: TRANSURETHRAL RESECTION OF BLADDER TUMOR (TURBT) WITH CYSTOSCOPY/ RIGHT RETROGRADE PYELOGRAM;  Surgeon: Raynelle Bring, MD;  Location[facility];  Service: Urology;   Laterality: N/A;  ONLY NEEDS 45 MIN  . TRANSURETHRAL RESECTION OF BLADDER TUMOR Right 10/31/2018   Procedure: TRANSURETHRAL RESECTION OF BLADDER TUMOR (TURBT) WITH CYSTOSCOPY/BILATERAL RETROGRADE/ RIGHT URETEROSCOPY WITH LASER ABLATION OF TUMOR/URETERAL STENT;  Surgeon: Raynelle Bring, MD;  Location[facility];  Service: Urology;  Laterality: Right;    MEDICATIONS: . loperamide (IMODIUM A-D) 2 MG tablet  . Multiple Vitamin (MULTIVITAMIN WITH MINERALS) TABS tablet   No current facility-administered medications for this encounter.    Konrad Felix, PA-C [facility] [phone]

## 2020-05-26 NOTE — H&P (Signed)
Office Visit Report     04/28/2020   --------------------------------------------------------------------------------   Wesley Snyder  MRN: [id number]  DOB: 14-Apr-1932, 87 year old Male  SSN: [id number]   PRIMARY CARE:    REFERRING:  Wesley Snyder  PROVIDER:  Raynelle Bring, M.D.  LOCATION:  [facility] [phone]     --------------------------------------------------------------------------------   CC/HPI: 1. Urothelial carcinoma of solitary right kidney  2. Prostate cancer   Wesley Snyder follows up today for cystoscopy and stent removal after his recent ureteroscopic procedure. He was noted to have significant tumor regrowth in the lower pole of the right renal collecting system and underwent debulking with multiple large piece of tumor removed with a gem and I stone basket. We also performed laser lithotripsy at the same time. Considering his solitary kidney, his stent was left indwelling any returns today for removal. He has continued to have hematuria as expected. Unfortunately, he fell prior to his visit today and hit his head on the concrete of a side walk. This has resulted in quite a sizable hematoma on the right side of his forehead.     ALLERGIES: None    MEDICATIONS: None   GU PSH: Bladder Instill AntiCA Agent - 01/16/2019, 01/09/2019, 01/02/2019, 10/31/2018, 09/09/2018, 09/02/2018, 08/26/2018, 08/12/2018, 08/05/2018, 07/29/2018, 06/03/2018, 05/27/2018, 05/20/2018, 05/13/2018, 05/06/2018, 04/29/2018 Catheterization For Collection Of Specimen, Single Patient, All Places Of Service - 01/28/2018 Cysto Remove Stent FB Sim - 12/09/2019, 10/28/2019, 12/17/2018, 11/13/2018, 01/08/2018, 2018 Cysto Uretero Biopsy Fulgura, Right - 12/09/2018, Right - 12/24/2017, Right - 2018 Cysto Uretero W/excise Tumor, Right - 04/12/2020, Right - 11/24/2019, Right - 10/16/2019, Right - 10/31/2018, Right - 2018, Right - 2018 Cystoscopy - 03/10/2020, 10/01/2019, 07/09/2019, 04/09/2019, 03/12/2019, 10/18/2018,  07/12/2018, 2020, 11/23/2017 Cystoscopy Fulguration - 01/31/2018 Cystoscopy Insert Stent, Right - 04/12/2020, Right - 11/24/2019, Right - 10/16/2019, Right - 12/09/2018, Right - 10/31/2018, Right - 10/31/2018, Right - 12/24/2017, Right - 2019, Right - 2018, Right - 2018, Right - 2018 Cystoscopy TURBT <2 cm - 10/31/2018 Cystoscopy TURBT >5 cm - 2020 Cystoscopy TURBT 2-5 cm - 02/11/2018, Right - 12/24/2017 Cystoscopy Ureteroscopy, Right - 2019 Locm 300-399Mg /Ml Iodine,1Ml - 10/15/2018, 11/19/2017, 2019 Radical Prostatectomy     NON-GU PSH: Cataract surgery, Bilateral - 11/08/2017 Coronary Artery Bypass Grafting     GU PMH: Bladder Cancer overlapping sites - 03/10/2020, - 01/08/2018 Renal pelvis cancer, right - 03/10/2020, - 01/06/2020, - 12/30/2019, - 12/09/2019, - 2018 Gross hematuria - 01/06/2020, - 12/30/2019, - 12/10/2017, - 11/08/2017, - 2018 History of kidney cancer - 2018 Prostate Cancer - 2018 Right renal neoplasm - 2018      PMH Notes:   1) Renal cell carcinoma: He is s/p an open left radical nephrectomy at [facility] in 1994.   2) Prostate cancer: He is s/p an open RRP at [facility] in 1994. He apparently had a biochemical recurrence soon after primary therapy and has been on systemic therapy with ADT since then under his urologist in [place].   3) Urothelial carcinoma of the right kidney: He was found to have a right renal collecting system tumor on imaging in 2018 in his solitary right kidney. This was confirmed on ureteroscopic evaluation to be high grade urothelial carcinoma.   Aug 2018: Right ureteroscopy with laser ablation of right renal pelvic tumor - pathology indicated high-grade urothelial carcinoma  Sep 2018: Repeat right ureteroscopy with laser ablation of remaining right renal pelvic tumor - pathology confirmed high-grade urothelial carcinoma with a suspicious focus  of lamina propria invasion  Nov 2018: Repeat right ureteroscopy - Minimal tumor remaining and completely  laser ablated  May 2019: Cystoscopy with surveillance right ureteroscopy - Negative  Oct 2019: He develop recurrent hematuria, right ureteroscopy with laser ablation of small tumor in lower pole and TURBT of bladder tumor - High grade T1a urothelial carcinoma  Dec 2019: Recurrent hematuria and recurrent tumor  Dec 2019: TURBT - Multifocal high grade, T1 urothelial carcinoma  Jan 2020: TURBT - Recurrent multifocal high grade, T1 urothelial carcinoma, post-op intravesical gemcitabine  Feb-Apr 2020: 6 week induction BCG  May 2020: Positive cytology (no visible tumors)  Jun-Jul 2020: Repeat 6 week induction BCG  Sep 2020: Right ureteroscopic laser ablation of 1.5 cm lower pole tumor, TURBT small left 1 cm bladder tumor- high grade Ta  Oct 2020: Right ureteroscopy with completion ablation of right renal pelvic tumor  Nov 2020: 3 week maintenance BCG  Aug 2021: TURBT and right ureteroscopy - Laser ablation of tumor in lower pole but incomplete  Sep 2021: R ureteroscopic laser tumor ablation - biopsies with scant residual high grade tumor and necrotic tumor (likely ablated most if not all of tumor)  Feb 2022: R ureteroscopic tumor resection/laser ablation (high grade urothelial carcinoma)   4) Chronic bacteruria:      NON-GU PMH: Bacteriuria - 12/30/2019, (Stable), - 12/09/2019 (Stable), - 12/17/2018 (Stable), - 11/13/2018, - 2018 Pyuria/other UA findings - 2020 Coronary Artery Disease Sleep Apnea    FAMILY HISTORY: 1 Daughter - Runs in Family 2 sons - Runs in Family Cirrhosis - Father Death - Father, Mother   SOCIAL HISTORY: Marital Status: Married Preferred Language: English; Ethnicity: Not Hispanic Or Latino; Race: White Current Smoking Status: Patient has never smoked.   Tobacco Use Assessment Completed: Used Tobacco in last 30 days? Has never drank.  Drinks 1 caffeinated drink per day.    REVIEW OF SYSTEMS:    GU Review Male:   Patient denies frequent urination, hard to postpone  urination, burning/ pain with urination, get up at night to urinate, leakage of urine, stream starts and stops, trouble starting your streams, and have to strain to urinate .  Gastrointestinal (Lower):   Patient denies diarrhea and constipation.  Gastrointestinal (Upper):   Patient denies nausea and vomiting.  Constitutional:   Patient denies fever, night sweats, weight loss, and fatigue.  Skin:   Patient denies skin rash/ lesion and itching.  Eyes:   Patient denies blurred vision and double vision.  Ears/ Nose/ Throat:   Patient denies sore throat and sinus problems.  Hematologic/Lymphatic:   Patient denies swollen glands and easy bruising.  Cardiovascular:   Patient denies leg swelling and chest pains.  Respiratory:   Patient denies cough and shortness of breath.  Endocrine:   Patient denies excessive thirst.  Musculoskeletal:   Patient denies back pain and joint pain.  Neurological:   Patient denies dizziness and headaches.  Psychologic:   Patient denies depression and anxiety.   VITAL SIGNS:      04/28/2020 12:52 PM  Weight 150 lb / 68.04 kg  Height 71 in / 180.34 cm  BP 156/78 mmHg  Pulse 68 /min  Temperature 98.0 F / 36.6 C  BMI 20.9 kg/m   GU PHYSICAL EXAMINATION:    Urethral Meatus: Normal size. No lesion, no wart, no discharge, no polyp. Normal location.   Notes: He does have a large hematoma located on the right side of his forehead.   MULTI-SYSTEM PHYSICAL EXAMINATION:  Constitutional: Well-nourished. No physical deformities. Normally developed. Good grooming.     Complexity of Data:  Records Review:   Pathology Reports, Previous Patient Records   10/01/19 10/17/18 09/27/16  PSA  Total PSA <0.015 ng/mL <0.015 ng/mL <0.015 ng/dL    PROCEDURES:         Flexible Cystoscopy Right Stent Removal - 52310  Risks, benefits, and potential complications of the procedure were discussed at length with the patient. All questions were answered. Informed consent was obtained.  Sterile technique and intraurethral analgesia were used. Antibiotic prophylaxis:  Meatus:  Normal size. Normal location. Normal condition.  Urethra:  No strictures.  External Sphincter:  Normal.  Verumontanum:  Verumontanum Surgically Absent.  Prostate:  Prostate Surgically Absent.  Bladder Neck:  Non-obstructing.  Ureteral Orifices:  Normal location. Normal size. Normal shape. Effluxed clear urine.  Bladder:  No trabeculation. No tumors. Normal mucosa. No stones.  The right ureteral stent was carefully removed with a grasping instrument.    The procedure was well-tolerated and without complications.         Urinalysis w/Scope Dipstick Dipstick Cont'd Micro  Color: Red Bilirubin: Neg mg/dL WBC/hpf: 10 - 20/hpf  Appearance: Turbid Ketones: Trace mg/dL RBC/hpf: >60/hpf  Specific Gravity: 1.025 Blood: 3+ ery/uL Bacteria: Mod (26-50/hpf)  pH: 5.5 Protein: 3+ mg/dL Cystals: NS (Not Seen)  Glucose: Neg mg/dL Urobilinogen: 0.2 mg/dL Casts: NS (Not Seen)    Nitrites: Positive Trichomonas: Not Present    Leukocyte Esterase: 3+ leu/uL Mucous: Not Present      Epithelial Cells: 0 - 5/hpf      Yeast: NS (Not Seen)      Sperm: Not Present    Notes: microscopic not concentrated    ASSESSMENT:      ICD-10 Details  2 GU:   Renal pelvis cancer, right - C65.1   1 NON-GU:   Bacteriuria - R82.71    PLAN:            Medications New Meds: Cephalexin 500 mg capsule 1 capsule PO TID Begin the morning of your procedure  #3  0 Refill(s)            Orders Labs Urine Culture          Schedule Return Visit/Planned Activity: Other See Visit Notes             Note: Will call to schedule surgery.          Document Letter(s):  Created for Patient: Clinical Summary         Notes:   1. Urothelial carcinoma of the right renal pelvis and bladder: We reviewed his pathology indicating recurrent high-grade urothelial carcinoma. Again, he has refused definitive curative therapy. As such, we have  continued with endoscopic management. He likely does have residual tumor we have discussed the need to perform repeat endoscopy. I would like to give a little bit of time for his hematuria to resolve. I will tentatively plan to schedule him for later in March. This will be for ureteroscopy with tumor ablation with laser. He is already scheduled for an appointment in April we will use that for cystoscopy removal of his stent.   2. Prostate cancer: He is due for this to be checked around August of 2022.   3. Right cranial hematoma: I did recommend that he proceed to the emergency room for further evaluation. He and his wife for quite hesitant. However, I did counsel them that there was a significant risk that he could  potentially have internal bleeding considering what looks like a severe fall. They stated that they understood and are likely to proceed to the emergency department later today. He was provided with an ice pack in the meantime. He understands the potential risk of intracranial bleeding and possible death if he does not proceed to emergency department for timely evaluation.           Next Appointment:      Next Appointment: 06/23/2020 10:15 AM    Appointment Type: Male Cysto    Location: [facility] [phone]    Provider: Raynelle Bring, M.D.    Reason for Visit: 3 MO CYSTO/OV      * Signed by Raynelle Bring, M.D. on 04/28/20 at 5:14 PM (EST)*       APPENDED NOTES:  His preoperative hemoglobin was noted to be 6.7 yesterday. He went to the emergency room in [place] and received 3 units of red blood cells. I will plan to recheck his hemoglobin the day of his surgery next week.     * Signed by Raynelle Bring, M.D. on 05/19/20 at 1:26 PM (EDT)*

## 2020-05-27 ENCOUNTER — Encounter (HOSPITAL_COMMUNITY): Payer: Self-pay | Admitting: Urology

## 2020-05-27 ENCOUNTER — Ambulatory Visit (HOSPITAL_COMMUNITY): Payer: Medicare Other

## 2020-05-27 ENCOUNTER — Encounter (HOSPITAL_COMMUNITY)
Admission: AD | Disposition: A | Discharge status: Home Health | Payer: Self-pay | Source: Home / Self Care | Attending: Urology

## 2020-05-27 ENCOUNTER — Ambulatory Visit (HOSPITAL_COMMUNITY): Payer: Medicare Other | Admitting: Anesthesiology

## 2020-05-27 ENCOUNTER — Other Ambulatory Visit: Payer: Self-pay

## 2020-05-27 ENCOUNTER — Ambulatory Visit (HOSPITAL_COMMUNITY): Payer: Medicare Other | Admitting: Physician Assistant

## 2020-05-27 ENCOUNTER — Inpatient Hospital Stay (HOSPITAL_COMMUNITY)
Admission: AD | Admit: 2020-05-27 | Discharge: 2020-06-02 | DRG: 660 | Disposition: A | Discharge status: Home Health | Payer: Medicare Other | Attending: Urology | Admitting: Urology

## 2020-05-27 DIAGNOSIS — C651 Malignant neoplasm of right renal pelvis: Secondary | ICD-10-CM | POA: Diagnosis present

## 2020-05-27 DIAGNOSIS — N179 Acute kidney failure, unspecified: Principal | ICD-10-CM | POA: Diagnosis present

## 2020-05-27 DIAGNOSIS — D509 Iron deficiency anemia, unspecified: Secondary | ICD-10-CM | POA: Diagnosis present

## 2020-05-27 DIAGNOSIS — I2510 Atherosclerotic heart disease of native coronary artery without angina pectoris: Secondary | ICD-10-CM | POA: Diagnosis present

## 2020-05-27 DIAGNOSIS — Z8546 Personal history of malignant neoplasm of prostate: Secondary | ICD-10-CM

## 2020-05-27 DIAGNOSIS — Z951 Presence of aortocoronary bypass graft: Secondary | ICD-10-CM | POA: Diagnosis not present

## 2020-05-27 DIAGNOSIS — E440 Moderate protein-calorie malnutrition: Secondary | ICD-10-CM | POA: Diagnosis present

## 2020-05-27 DIAGNOSIS — Z87891 Personal history of nicotine dependence: Secondary | ICD-10-CM

## 2020-05-27 DIAGNOSIS — Z681 Body mass index (BMI) 19 or less, adult: Secondary | ICD-10-CM

## 2020-05-27 DIAGNOSIS — I129 Hypertensive chronic kidney disease with stage 1 through stage 4 chronic kidney disease, or unspecified chronic kidney disease: Secondary | ICD-10-CM | POA: Diagnosis present

## 2020-05-27 DIAGNOSIS — E875 Hyperkalemia: Secondary | ICD-10-CM | POA: Diagnosis present

## 2020-05-27 DIAGNOSIS — E872 Acidosis: Secondary | ICD-10-CM | POA: Diagnosis present

## 2020-05-27 DIAGNOSIS — Z905 Acquired absence of kidney: Secondary | ICD-10-CM | POA: Diagnosis not present

## 2020-05-27 DIAGNOSIS — Z85528 Personal history of other malignant neoplasm of kidney: Secondary | ICD-10-CM | POA: Diagnosis not present

## 2020-05-27 DIAGNOSIS — Z79899 Other long term (current) drug therapy: Secondary | ICD-10-CM | POA: Diagnosis not present

## 2020-05-27 DIAGNOSIS — N184 Chronic kidney disease, stage 4 (severe): Secondary | ICD-10-CM | POA: Diagnosis present

## 2020-05-27 DIAGNOSIS — R32 Unspecified urinary incontinence: Secondary | ICD-10-CM | POA: Diagnosis present

## 2020-05-27 DIAGNOSIS — Z20822 Contact with and (suspected) exposure to covid-19: Secondary | ICD-10-CM | POA: Diagnosis present

## 2020-05-27 DIAGNOSIS — Z7189 Other specified counseling: Secondary | ICD-10-CM | POA: Diagnosis not present

## 2020-05-27 DIAGNOSIS — C689 Malignant neoplasm of urinary organ, unspecified: Secondary | ICD-10-CM | POA: Diagnosis not present

## 2020-05-27 DIAGNOSIS — Z9079 Acquired absence of other genital organ(s): Secondary | ICD-10-CM | POA: Diagnosis not present

## 2020-05-27 DIAGNOSIS — Z515 Encounter for palliative care: Secondary | ICD-10-CM | POA: Diagnosis not present

## 2020-05-27 DIAGNOSIS — R531 Weakness: Secondary | ICD-10-CM | POA: Diagnosis not present

## 2020-05-27 HISTORY — PX: CYSTOSCOPY WITH URETEROSCOPY AND STENT PLACEMENT: SHX6377

## 2020-05-27 LAB — POCT I-STAT, CHEM 8
BUN: 66 mg/dL — ABNORMAL HIGH (ref 8–23)
Calcium, Ion: 1.03 mmol/L — ABNORMAL LOW (ref 1.15–1.40)
Chloride: 117 mmol/L — ABNORMAL HIGH (ref 98–111)
Creatinine, Ser: 8.9 mg/dL — ABNORMAL HIGH (ref 0.61–1.24)
Glucose, Bld: 87 mg/dL (ref 70–99)
HCT: 31 % — ABNORMAL LOW (ref 39.0–52.0)
Hemoglobin: 10.5 g/dL — ABNORMAL LOW (ref 13.0–17.0)
Potassium: 5.4 mmol/L — ABNORMAL HIGH (ref 3.5–5.1)
Sodium: 138 mmol/L (ref 135–145)
TCO2: 12 mmol/L — ABNORMAL LOW (ref 22–32)

## 2020-05-27 LAB — HEMOGLOBIN AND HEMATOCRIT, BLOOD
HCT: 27.7 % — ABNORMAL LOW (ref 39.0–52.0)
Hemoglobin: 8.6 g/dL — ABNORMAL LOW (ref 13.0–17.0)

## 2020-05-27 LAB — BASIC METABOLIC PANEL
Anion gap: 8 (ref 5–15)
BUN: 72 mg/dL — ABNORMAL HIGH (ref 8–23)
CO2: 15 mmol/L — ABNORMAL LOW (ref 22–32)
Calcium: 8.5 mg/dL — ABNORMAL LOW (ref 8.9–10.3)
Chloride: 113 mmol/L — ABNORMAL HIGH (ref 98–111)
Creatinine, Ser: 8.2 mg/dL — ABNORMAL HIGH (ref 0.61–1.24)
GFR, Estimated: 6 mL/min — ABNORMAL LOW (ref >60)
Glucose, Bld: 104 mg/dL — ABNORMAL HIGH (ref 70–99)
Potassium: 5.4 mmol/L — ABNORMAL HIGH (ref 3.5–5.1)
Sodium: 136 mmol/L (ref 135–145)

## 2020-05-27 SURGERY — CYSTOURETEROSCOPY, WITH STENT INSERTION
Anesthesia: General | Laterality: Right

## 2020-05-27 MED ORDER — ORAL CARE MOUTH RINSE: 15.0000 mL | Freq: Once | OROMUCOSAL | Status: AC

## 2020-05-27 MED ORDER — PROPOFOL 10 MG/ML IV BOLUS
INTRAVENOUS | Status: DC | PRN
  Administered 2020-05-27: 100 mg via INTRAVENOUS

## 2020-05-27 MED ORDER — FENTANYL CITRATE (PF) 100 MCG/2ML IJ SOLN
INTRAMUSCULAR | Status: DC | PRN
  Administered 2020-05-27: 25 ug via INTRAVENOUS

## 2020-05-27 MED ORDER — FENTANYL CITRATE (PF) 100 MCG/2ML IJ SOLN
25.0000 ug | INTRAMUSCULAR | Status: DC | PRN
Start: 2020-05-27 — End: 2020-05-27

## 2020-05-27 MED ORDER — 0.9 % SODIUM CHLORIDE (POUR BTL) OPTIME
TOPICAL | Status: DC | PRN
  Administered 2020-05-27: 1000 mL

## 2020-05-27 MED ORDER — SODIUM CHLORIDE 0.9 % IR SOLN
Status: DC | PRN
  Administered 2020-05-27: 6000 mL

## 2020-05-27 MED ORDER — LACTATED RINGERS IV SOLN: INTRAVENOUS | Status: DC

## 2020-05-27 MED ORDER — CHLORHEXIDINE GLUCONATE 0.12 % MT SOLN
15.0000 mL | Freq: Once | OROMUCOSAL | Status: AC
  Administered 2020-05-27: 15 mL via OROMUCOSAL

## 2020-05-27 MED ORDER — SODIUM CHLORIDE 0.9 % IV SOLN: INTRAVENOUS | Status: DC

## 2020-05-27 MED ORDER — NEPRO/CARBSTEADY PO LIQD
237.0000 mL | Freq: Two times a day (BID) | ORAL | Status: DC
  Administered 2020-05-28: 237 mL via ORAL
  Filled 2020-05-27 (×2): qty 237

## 2020-05-27 MED ORDER — IOHEXOL 300 MG/ML  SOLN
INTRAMUSCULAR | Status: DC | PRN
  Administered 2020-05-27: 18 mL

## 2020-05-27 MED ORDER — FENTANYL CITRATE (PF) 100 MCG/2ML IJ SOLN
INTRAMUSCULAR | Status: AC
  Filled 2020-05-27: qty 2

## 2020-05-27 MED ORDER — PROPOFOL 10 MG/ML IV BOLUS
INTRAVENOUS | Status: AC
  Filled 2020-05-27: qty 20

## 2020-05-27 MED ORDER — DIPHENHYDRAMINE HCL 12.5 MG/5ML PO ELIX: 12.5000 mg | ORAL_SOLUTION | Freq: Four times a day (QID) | ORAL | Status: DC | PRN

## 2020-05-27 MED ORDER — ZOLPIDEM TARTRATE 5 MG PO TABS: 5.0000 mg | ORAL_TABLET | Freq: Every evening | ORAL | Status: DC | PRN

## 2020-05-27 MED ORDER — ACETAMINOPHEN 325 MG PO TABS: 650.0000 mg | ORAL_TABLET | ORAL | Status: DC | PRN

## 2020-05-27 MED ORDER — DEXAMETHASONE SODIUM PHOSPHATE 4 MG/ML IJ SOLN
INTRAMUSCULAR | Status: DC | PRN
  Administered 2020-05-27: 4 mg via INTRAVENOUS

## 2020-05-27 MED ORDER — DOCUSATE SODIUM 100 MG PO CAPS
100.0000 mg | ORAL_CAPSULE | Freq: Two times a day (BID) | ORAL | Status: DC
  Administered 2020-05-27: 100 mg via ORAL
  Filled 2020-05-27 (×7): qty 1

## 2020-05-27 MED ORDER — DIPHENHYDRAMINE HCL 50 MG/ML IJ SOLN
12.5000 mg | Freq: Four times a day (QID) | INTRAMUSCULAR | Status: DC | PRN
Start: 2020-05-27 — End: 2020-06-02

## 2020-05-27 MED ORDER — BELLADONNA ALKALOIDS-OPIUM 16.2-60 MG RE SUPP: 1.0000 | Freq: Four times a day (QID) | RECTAL | Status: DC | PRN

## 2020-05-27 MED ORDER — GENTAMICIN SULFATE 40 MG/ML IJ SOLN
1.5000 mg/kg | Freq: Once | INTRAVENOUS | Status: AC
  Administered 2020-05-27: 90 mg via INTRAVENOUS
  Filled 2020-05-27: qty 2.25

## 2020-05-27 MED ORDER — ONDANSETRON HCL 4 MG/2ML IJ SOLN
4.0000 mg | INTRAMUSCULAR | Status: DC | PRN
  Administered 2020-06-01: 4 mg via INTRAVENOUS
  Filled 2020-05-27: qty 2

## 2020-05-27 MED ORDER — ONDANSETRON HCL 4 MG/2ML IJ SOLN
INTRAMUSCULAR | Status: DC | PRN
  Administered 2020-05-27: 4 mg via INTRAVENOUS

## 2020-05-27 MED ORDER — HYDROCODONE-ACETAMINOPHEN 5-325 MG PO TABS
1.0000 | ORAL_TABLET | ORAL | Status: DC | PRN
  Administered 2020-05-28 – 2020-06-01 (×6): 1 via ORAL
  Filled 2020-05-27 (×6): qty 1

## 2020-05-27 MED ORDER — LIDOCAINE HCL (CARDIAC) PF 100 MG/5ML IV SOSY
PREFILLED_SYRINGE | INTRAVENOUS | Status: DC | PRN
  Administered 2020-05-27: 60 mg via INTRAVENOUS

## 2020-05-27 SURGICAL SUPPLY — 23 items
BAG DRN RND TRDRP ANRFLXCHMBR (UROLOGICAL SUPPLIES) ×1
BAG URINE DRAIN 2000ML AR STRL (UROLOGICAL SUPPLIES) ×2 IMPLANT
BAG URO CATCHER STRL LF (MISCELLANEOUS) ×2 IMPLANT
BASKET ZERO TIP NITINOL 2.4FR (BASKET) IMPLANT
BSKT STON RTRVL ZERO TP 2.4FR (BASKET)
CATH FOLEY 2WAY SLVR  5CC 16FR (CATHETERS) ×2
CATH FOLEY 2WAY SLVR 5CC 16FR (CATHETERS) IMPLANT
CATH INTERMIT  6FR 70CM (CATHETERS) ×1 IMPLANT
CLOTH BEACON ORANGE TIMEOUT ST (SAFETY) ×2 IMPLANT
GLOVE SURG ENC TEXT LTX SZ7.5 (GLOVE) ×2 IMPLANT
GOWN STRL REUS W/TWL LRG LVL3 (GOWN DISPOSABLE) ×2 IMPLANT
GUIDEWIRE STR DUAL SENSOR (WIRE) ×2 IMPLANT
GUIDEWIRE ZIPWRE .038 STRAIGHT (WIRE) IMPLANT
KIT TURNOVER KIT A (KITS) ×2 IMPLANT
LASER FIB FLEXIVA PULSE ID 365 (Laser) IMPLANT
MANIFOLD NEPTUNE II (INSTRUMENTS) ×2 IMPLANT
PACK CYSTO (CUSTOM PROCEDURE TRAY) ×2 IMPLANT
SHEATH URETERAL 12FRX35CM (MISCELLANEOUS) ×1 IMPLANT
STENT URET 6FRX26 CONTOUR (STENTS) ×1 IMPLANT
TRACTIP FLEXIVA PULS ID 200XHI (Laser) IMPLANT
TRACTIP FLEXIVA PULSE ID 200 (Laser)
TUBING CONNECTING 10 (TUBING) ×2 IMPLANT
TUBING UROLOGY SET (TUBING) ×2 IMPLANT

## 2020-05-27 NOTE — Anesthesia Preprocedure Evaluation (Addendum)
Anesthesia Evaluation  Patient identified by MRN, date of birth, ID band Patient awake    Reviewed: Allergy & Precautions, NPO status , Patient's Chart, lab work & pertinent test results  Airway Mallampati: II  TM Distance: >3 FB Neck ROM: Full    Dental  (+) Dental Advisory Given   Pulmonary former smoker,    breath sounds clear to auscultation       Cardiovascular hypertension, Pt. on medications + CAD   Rhythm:Regular Rate:Normal     Neuro/Psych negative neurological ROS     GI/Hepatic negative GI ROS, Neg liver ROS,   Endo/Other  negative endocrine ROS  Renal/GU CRF and ARFRenal disease     Musculoskeletal  (+) Arthritis ,   Abdominal   Peds  Hematology  (+) anemia ,   Anesthesia Other Findings   Reproductive/Obstetrics                            Anesthesia Physical Anesthesia Plan  ASA: III  Anesthesia Plan: General   Post-op Pain Management:    Induction: Intravenous  PONV Risk Score and Plan: 2 and Dexamethasone, Ondansetron and Treatment may vary due to age or medical condition  Airway Management Planned: LMA  Additional Equipment:   Intra-op Plan:   Post-operative Plan: Extubation in OR  Informed Consent: I have reviewed the patients History and Physical, chart, labs and discussed the procedure including the risks, benefits and alternatives for the proposed anesthesia with the patient or authorized representative who has indicated his/her understanding and acceptance.     Dental advisory given  Plan Discussed with: CRNA  Anesthesia Plan Comments:         Anesthesia Quick Evaluation

## 2020-05-27 NOTE — Transfer of Care (Signed)
Immediate Anesthesia Transfer of Care Note  Patient: Wesley Snyder.  Procedure(s) Performed: Procedure(s): CYSTOSCOPY / RETROGRADE/URETEROSCOPY /STENT PLACEMENT (Right)  Patient Location: PACU  Anesthesia Type:General  Level of Consciousness: Patient easily awoken, sedated, comfortable, cooperative, following commands, responds to stimulation.   Airway & Oxygen Therapy: Patient spontaneously breathing, ventilating well, oxygen via simple oxygen mask.  Post-op Assessment: Report given to PACU RN, vital signs reviewed and stable, moving all extremities.   Post vital signs: Reviewed and stable.  Complications: No apparent anesthesia complications  Last Vitals:  Vitals Value Taken Time  BP 137/75 05/27/20 1212  Temp    Pulse 68 05/27/20 1213  Resp 14 05/27/20 1213  SpO2 100 % 05/27/20 1213  Vitals shown include unvalidated device data.  Last Pain:  Vitals:   05/27/20 1041  TempSrc: Oral         Complications: No complications documented.

## 2020-05-27 NOTE — Interval H&P Note (Signed)
History and Physical Interval Note:  05/27/2020 11:09 AM  Wesley Snyder.  has presented today for surgery, with the diagnosis of UROTHELIAL CARCINOMA OF THE RIGHT RENAL PELVIS.  The various methods of treatment have been discussed with the patient and family. After consideration of risks, benefits and other options for treatment, the patient has consented to  Procedure(s): CYSTOSCOPY / RETROGRADE/URETEROSCOPY / [place] (Right) as a surgical intervention.  The patient's history has been reviewed, patient examined, no change in status, stable for surgery.  I have reviewed the patient's chart and labs.  Questions were answered to the patient's satisfaction.     [employer]

## 2020-05-27 NOTE — Anesthesia Procedure Notes (Addendum)
Procedure Name: LMA Insertion Performed by: Deliah Boston, CRNA Pre-anesthesia Checklist: Patient identified, Emergency Drugs available, Suction available and Patient being monitored Patient Re-evaluated:Patient Re-evaluated prior to induction Oxygen Delivery Method: Circle system utilized Preoxygenation: Pre-oxygenation with 100% oxygen Induction Type: IV induction Ventilation: Mask ventilation without difficulty LMA: LMA inserted LMA Size: 5.0 Number of attempts: 1 Placement Confirmation: positive ETCO2 and breath sounds checked- equal and bilateral Tube secured with: Tape Dental Injury: Teeth and Oropharynx as per pre-operative assessment

## 2020-05-27 NOTE — Anesthesia Postprocedure Evaluation (Signed)
Anesthesia Post Note  Patient: Wesley Snyder.  Procedure(s) Performed: CYSTOSCOPY / RETROGRADE/URETEROSCOPY /STENT PLACEMENT (Right )     Patient location during evaluation: PACU Anesthesia Type: General Level of consciousness: awake and alert Pain management: pain level controlled Vital Signs Assessment: post-procedure vital signs reviewed and stable Respiratory status: spontaneous breathing, nonlabored ventilation, respiratory function stable and patient connected to nasal cannula oxygen Cardiovascular status: blood pressure returned to baseline and stable Postop Assessment: no apparent nausea or vomiting Anesthetic complications: no   No complications documented.  Last Vitals:  Vitals:   05/27/20 1400 05/27/20 1518  BP: 135/71 (!) 149/70  Pulse: (!) 52 (!) 48  Resp: 12 18  Temp:    SpO2: 99% 99%    Last Pain:  Vitals:   05/27/20 1400  TempSrc:   PainSc: 0-No pain                 Tiajuana Amass

## 2020-05-27 NOTE — Progress Notes (Signed)
Patient ID: Wesley Snyder., male   DOB: 1932-05-18, 87 y.o.   MRN: [id number]    I spoke with Rush Landmark and his wife this afternoon as to whether he would want to be resuscitated if needed.  He and his wife clearly stated that he would not want to be resuscitated with life saving measures.  He will be made DNR status.

## 2020-05-27 NOTE — Op Note (Signed)
Preoperative diagnosis: 1.  Urothelial carcinoma of the right renal pelvis 2.  Solitary right kidney 3.  Chronic kidney disease  Postoperative diagnosis:  1.  Urothelial carcinoma of the right renal pelvis 2.  Solitary right kidney 3.  Chronic kidney disease with acute kidney injury  Procedures: 1.  Cystoscopy 2.  Right retrograde pyelography with interpretation 3.  Right ureteroscopy 4.  Right ureteral stent placement (6 x 26 -no string)  Surgeon: Pryor Curia MD  Anesthesia: General  Complications: None  EBL: Minimal  Specimens: None  Intraoperative findings: Retrograde pyelography was performed on the right side with Omnipaque contrast and a 6 French ureteral catheter.  This did reveal moderate dilation of the right ureter without filling defects.  There was a large filling defect in the lower pole of the calyx consistent with his previously known tumor.  He did have evidence of calyceal dilation as well concerning for hydronephrosis/obstruction.  Indication: Wesley Snyder is an 87 year old gentleman with a complex urologic history.  He has a history of prostate cancer status post radical prostatectomy and has been disease-free.  He also has a history of renal cell carcinoma status post left nephrectomy in the distant past.  He has a solitary right kidney with known high-grade urothelial carcinoma the right renal pelvis.  He had refused definitive surgical therapy with nephro ureterectomy due to his refusal to consider dialysis.  He does have chronic kidney disease with a baseline creatinine of around 2.0.  As such, we have been proceeding with endoscopic management including periodic laser ablation of his high-grade tumor in the lower pole of the right kidney.  He recently was found to have significant regrowth of tumor and underwent a procedure where a large amount of tumor was removed.  He returns today for a second stage procedure.  In the interim, he has had significant ongoing  hematuria.  He was found of a hemoglobin of 6.7 last week and did receive 3 units of packed red blood cells.  His hemoglobin today is increased to 10.5 appropriately.  However, he has also been noted to have worsening renal function with a creatinine over 4 last week which is now 8.9 today.  He therefore presents for further evaluation and possible treatment of his tumor as well as for ureteral stent placement to ensure he does not have an obstructive etiology for his renal failure.  The potential risks, complications, and expected recovery process associated with the above procedures was discussed in detail.  Informed consent was obtained.  Description of procedure: The patient was taken the operating room and a general anesthetic was administered.  He was given preoperative antibiotics, placed in the dorsolithotomy position, and prepped and draped in usual sterile fashion.  Next, a preoperative timeout was performed.  Cystourethroscopy was performed.  Visualization was obscured due to significant hematuria.  However, no obvious bladder tumors were identified.  The left ureteral orifice was not readily identified.  The right ureteral orifice was identified and appeared patent and widely open.  A 6 French ureteral catheter was then used to intubate the right ureteral orifice and Omnipaque contrast was injected with findings as dictated above.  There was evidence of possible hydronephrosis.  A 0.38 sensor guidewire was then advanced up the right ureter into the right renal collecting system.  A 12/14 ureteral access sheath was then advanced over the wire into the proximal ureter and digital flexible ureteroscopy was then performed.  There was poor visualization due to hematuria which  likely persisted from the patient's prior ureteroscopic procedure.  I was able to visualize what appeared to still be significant tumor growth in the lower pole calyx.  Otherwise, visualization was quite poor and it was not felt it  would be safe to proceed with ablation at this time.  Considering his developing renal failure, a ureteral stent was placed.  The wire was left in place and the ureteral access sheath was removed.  This was backloaded on the cystoscope and a 6 x 26 double-J ureteral stent was advanced over the wire using Seldinger technique and positioned appropriately under fluoroscopic and cystoscopic guidance.  The wire was removed with a good curl noted in the renal pelvis as well as within the bladder.  A 16 French Foley catheter was inserted for monitoring his urine output.  He tolerated the procedure well without complications.  He was able to be awakened and transferred to recovery unit in satisfactory condition.  Disposition: Considering his developing renal failure, he will be admitted to the hospital with repeat labs drawn this afternoon and in the morning to determine if this is improving status post ureteral stent placement.  He has previously refused to consider renal replacement with dialysis.  I will consult nephrology if he is not improving so that they can further discuss this with him so he can make a fully informed decision.  We will also consider a palliative care consult in that setting.

## 2020-05-27 NOTE — Discharge Instructions (Signed)

## 2020-05-28 ENCOUNTER — Encounter (HOSPITAL_COMMUNITY): Payer: Self-pay | Admitting: Urology

## 2020-05-28 LAB — BASIC METABOLIC PANEL
Anion gap: 10 (ref 5–15)
Anion gap: 8 (ref 5–15)
BUN: 80 mg/dL — ABNORMAL HIGH (ref 8–23)
BUN: 83 mg/dL — ABNORMAL HIGH (ref 8–23)
CO2: 15 mmol/L — ABNORMAL LOW (ref 22–32)
CO2: 15 mmol/L — ABNORMAL LOW (ref 22–32)
Calcium: 8.5 mg/dL — ABNORMAL LOW (ref 8.9–10.3)
Calcium: 8.3 mg/dL — ABNORMAL LOW (ref 8.9–10.3)
Chloride: 112 mmol/L — ABNORMAL HIGH (ref 98–111)
Chloride: 113 mmol/L — ABNORMAL HIGH (ref 98–111)
Creatinine, Ser: 8.19 mg/dL — ABNORMAL HIGH (ref 0.61–1.24)
Creatinine, Ser: 8.02 mg/dL — ABNORMAL HIGH (ref 0.61–1.24)
GFR, Estimated: 6 mL/min — ABNORMAL LOW (ref >60)
GFR, Estimated: 6 mL/min — ABNORMAL LOW (ref >60)
Glucose, Bld: 127 mg/dL — ABNORMAL HIGH (ref 70–99)
Glucose, Bld: 112 mg/dL — ABNORMAL HIGH (ref 70–99)
Potassium: 5.5 mmol/L — ABNORMAL HIGH (ref 3.5–5.1)
Potassium: 5 mmol/L (ref 3.5–5.1)
Sodium: 137 mmol/L (ref 135–145)
Sodium: 136 mmol/L (ref 135–145)

## 2020-05-28 LAB — IRON AND TIBC
Iron: 18 ug/dL — ABNORMAL LOW (ref 45–182)
Saturation Ratios: 6 % — ABNORMAL LOW (ref 17.9–39.5)
TIBC: 291 ug/dL (ref 250–450)
UIBC: 273 ug/dL

## 2020-05-28 LAB — URINALYSIS, ROUTINE W REFLEX MICROSCOPIC
Bacteria, UA: NONE SEEN
Protein, ur: 100 mg/dL — AB
RBC / HPF: >50 RBC/hpf — ABNORMAL HIGH (ref 0–5)

## 2020-05-28 LAB — SODIUM, URINE, RANDOM: Sodium, Ur: 105 mmol/L

## 2020-05-28 LAB — HEMOGLOBIN AND HEMATOCRIT, BLOOD
HCT: 24.9 % — ABNORMAL LOW (ref 39.0–52.0)
Hemoglobin: 8 g/dL — ABNORMAL LOW (ref 13.0–17.0)

## 2020-05-28 MED ORDER — PROSOURCE PLUS PO LIQD
30.0000 mL | Freq: Every day | ORAL | Status: DC
  Administered 2020-05-31 – 2020-06-02 (×3): 30 mL via ORAL
  Filled 2020-05-28 (×4): qty 30

## 2020-05-28 MED ORDER — DOXYCYCLINE HYCLATE 100 MG PO TABS
100.0000 mg | ORAL_TABLET | Freq: Two times a day (BID) | ORAL | Status: DC
  Administered 2020-05-28 – 2020-06-02 (×11): 100 mg via ORAL
  Filled 2020-05-28 (×11): qty 1

## 2020-05-28 MED ORDER — NEPRO/CARBSTEADY PO LIQD
237.0000 mL | ORAL | Status: DC
  Administered 2020-05-29 – 2020-05-31 (×3): 237 mL via ORAL
  Filled 2020-05-28 (×6): qty 237

## 2020-05-28 MED ORDER — SODIUM BICARBONATE 650 MG PO TABS
650.0000 mg | ORAL_TABLET | Freq: Three times a day (TID) | ORAL | Status: DC
  Administered 2020-05-28 – 2020-05-29 (×4): 650 mg via ORAL
  Filled 2020-05-28 (×4): qty 1

## 2020-05-28 MED ORDER — SODIUM ZIRCONIUM CYCLOSILICATE 10 G PO PACK
10.0000 g | PACK | Freq: Every day | ORAL | Status: DC
  Administered 2020-05-28 – 2020-06-01 (×5): 10 g via ORAL
  Filled 2020-05-28 (×5): qty 1

## 2020-05-28 MED ORDER — CHLORHEXIDINE GLUCONATE CLOTH 2 % EX PADS
6.0000 | MEDICATED_PAD | Freq: Every day | CUTANEOUS | Status: DC
  Administered 2020-05-28 – 2020-06-01 (×5): 6 via TOPICAL

## 2020-05-28 NOTE — Progress Notes (Signed)
Patient ID: Wesley Danes., male   DOB: 02/17/1933, 87 y.o.   MRN: [id number]  1 Day Post-Op Subjective: Wesley Snyder is doing well this morning.  He is a little confused per his wife but denies pain and feels a little better overall with a bit more energy.    Objective: Vital signs in last 24 hours: Temp:  [97.5 F (36.4 C)-97.9 F (36.6 C)] 97.9 F (36.6 C) (04/01 0524) Pulse Rate:  [48-71] 50 (04/01 0524) Resp:  [12-20] 18 (04/01 0524) BP: (126-160)/(61-87) 138/84 (04/01 0524) SpO2:  [98 %-100 %] 98 % (04/01 0524) Weight:  [63 kg-63.2 kg] 63.2 kg (03/31 1518)  Intake/Output from previous day: 03/31 0701 - 04/01 0700 In: 1018.3 [I.V.:916; IV Piggyback:102.3] Out: 390 [Urine:390] Intake/Output this shift: No intake/output data recorded.  Physical Exam:  General: Alert and oriented CV: RRR Lungs: Clear Abdomen: Soft, ND, No CVAT GU: Urine lighter pink in tubing now Ext: NT, No erythema  Lab Results: Recent Labs    05/27/20 1041 05/27/20 1314 05/28/20 0436  HGB 10.5* 8.6* 8.0*  HCT 31.0* 27.7* 24.9*   BMET Recent Labs    05/27/20 1314 05/28/20 0436  NA 136 137  K 5.4* 5.5*  CL 113* 112*  CO2 15* 15*  GLUCOSE 104* 127*  BUN 72* 80*  CREATININE 8.20* 8.19*  CALCIUM 8.5* 8.5*     Studies/Results:   Assessment/Plan: 1) High grade urothelial carcinoma of the right renal pelvis in solitary right kidney:  He has refused nephroureterectomy (due to refusal to consider dialysis) and has been undergoing endoscopic treatment for palliative management over the past couple of years.  Yesterday, he had too much hematuria and so visualization was obscured during ureteroscopy and I could not proceed with additional laser ablation.  He has no evidence of metastatic disease when last assessed.    2) Anemia:  He has had continued hematuria with Hgb of 6.7 last week.  He received 3 units of PRBCs in [place] last week and Hgb was over 10 yesterday and now drifting to 8.0.   This is likely a combination of hematuria/dilutional effect/renal failure.  Continue to monitor and consider transfusion if Hgb < 7.0.  3) AKI/CKD:  Baseline Cr is around 1.8-2.0 but recently renal function has worsened.  This was felt to be related to a combination of recent anemia/dehydration and possible clot obstruction of his solitary right kidney and his UOP was minimal the past few days at home.  He also unfortunately received gentamicin preoperatively before it was known how much his renal dysfunction had progressed.   Now s/p right ureteral stent yesterday with improving UOP but minimal change in renal function.  Again, patient has previously stated that he does not want to consider dialysis.  Will consult nephrology today to see if they have any recommendations to optimize chances for resolving AKI and to definitively determine his decision about dialysis (lilkely a poor candidate regardless).  Continue gentle hydration, renal diet.  Potassium stable this morning.  4) Disposition:  Assuming he does decline dialysis, will also consult palliative care medicine to define goals of care.  I did discuss this yesterday with him and his wife and he was made DNR after discussion per their wishes.  Although his urothelial cancer is localized at this time, it is high grade and now further endoscopic treatment may be increasingly difficult due to hematuria so he will be at higher risk for developing metastatic disease and will probably be a poor candidate  for systemic therapies.    LOS: 1 day   Dutch Gray 05/28/2020, 7:41 AM

## 2020-05-28 NOTE — Progress Notes (Signed)
Assumed care from Chancy Hurter, RN. Agree with previously documented assessment. Will continue with plan of care.

## 2020-05-28 NOTE — Progress Notes (Signed)
Initial Nutrition Assessment  DOCUMENTATION CODES:   Non-severe (moderate) malnutrition in context of chronic illness  INTERVENTION:  - will decrease Nepro Shake from BID to once/day, each supplement provides 425 kcal and 19 grams protein. - will order 30 ml Prosource Plus once/day, each supplement provides 100 kcal and 15 grams protein.  - consider liberalizing diet if patient moves toward comfort-based care.    NUTRITION DIAGNOSIS:   Moderate Malnutrition related to chronic illness,cancer and cancer related treatments as evidenced by moderate fat depletion,moderate muscle depletion.  GOAL:   Patient will meet greater than or equal to 90% of their needs  MONITOR:   PO intake,Supplement acceptance,Labs,Weight trends  REASON FOR ASSESSMENT:   Malnutrition Screening Tool  ASSESSMENT:   87 year-old male with medical history of arthritis, CAD, CKD, anxiety, anemia, HTN, depression, urothelial carcinoma of solitary R kidney, prostate cancer. He presented to the hospital for cystoscopy and stent removal following recent ureteroscopic procedure.  No intakes documented since admission. Patient was laying in bed and was mainly sleeping during visit. His wife was in the chair at bedside and provided nearly all of the information.  [name] (726 kcal and 19 grams protein). [name] but did not care for the taste and chalkiness of the supplement. He is willing to try another flavor; RN is going to contact Pharmacy about this.   For the past ~1 month patient has had a decreased appetite. Although some days he eats very well, most days he eats well at breakfast, has a small lunch (such as a sandwich), and then does not eat the rest of the day.  He has no chewing or swallowing difficulties at baseline and has not reported any abdominal pain or nausea.   He uses a cane or walker to aid with ambulation and is  able to stand up independently.   Weight yesterday was 139 lb and weight on 04/09/20 was 148 lb. This indicates 9 lb weight loss (6% body weight) in the past 1.5 months; not significant for time frame.    Labs reviewed; Cl: 113 mmol/l, BUN: 83 mg/dl, creatinine: 8.02 mg/dl, Ca: 8.3 mg/dl, GFR: 6 ml/min.  Medications reviewed; 100 mg colace BID, 650 mg sodium bicarb TID, 10 g lokelma/day.  IVF; NS @ 75 ml/hr.     NUTRITION - FOCUSED PHYSICAL EXAM:  Flowsheet Row Most Recent Value  Orbital Region Moderate depletion  Upper Arm Region Mild depletion  Thoracic and Lumbar Region Unable to assess  Buccal Region Moderate depletion  Temple Region Moderate depletion  Clavicle Bone Region Moderate depletion  Clavicle and Acromion Bone Region Severe depletion  Scapular Bone Region Unable to assess  Dorsal Hand Mild depletion  Patellar Region Moderate depletion  Anterior Thigh Region Moderate depletion  Posterior Calf Region Moderate depletion  Edema (RD Assessment) None  Hair Reviewed  Eyes Reviewed  Mouth Reviewed  Skin Reviewed  Nails Reviewed       Diet Order:   Diet Order            Diet renal with fluid restriction Fluid restriction: 1200 mL Fluid; Room service appropriate? Yes; Fluid consistency: Thin  Diet effective now                 EDUCATION NEEDS:   Not appropriate for education at this time  Skin:  Skin Assessment: Reviewed RN Assessment  Last BM:  4/1 (type 7  x1)  Height:   Ht Readings from Last 1 Encounters:  05/27/20 5\' 11"  (1.803 m)    Weight:   Wt Readings from Last 1 Encounters:  05/27/20 63.2 kg    Estimated Nutritional Needs:  Kcal:  1900-2100 kcal Protein:  80-90 grams Fluid:  >/= 1.2 L/day      Wesley Matin, MS, RD, LDN, CNSC Inpatient Clinical Dietitian RD pager # available in [facility]  After hours/weekend pager # available in [facility]

## 2020-05-28 NOTE — Consult Note (Signed)
[facility] Renal Consultation Note  Requesting MD: Dr. Alinda Money Indication for Consultation: Acute on chronic kidney disease, hyperkalemia, metabolic acidosis  HPI:   Wesley Snyder. is a 87 y.o. male.  Who presented for palliative treatment of a uroepithelial tumor involving the right renal pelvis.  Has had progressive loss of renal function over the past 2 months.  Status post treatment in February 2022.  Baseline creatinine about 1.5-2 mg/dL.  Patient has not wish to undergo dialysis and has been adamant in his decision.  Is making some urine.  He underwent cystoscopy and retrograde/ureteroscopy/laser ablation of tumor and stent placement in the right urinary system.  The does not appear to be the use of any nephrotoxins.  There were no ACE inhibitor's ARB's or nonsteroidal anti-inflammatory drugs.  There has not been the use of any IV contrast.  He did receive a dose of gentamicin 05/27/2020.  Blood pressure 134/84 pulse 50 temperature 97.9.  Urine output 190 cc 05/27/2020  Past medical history: Coronary artery bypass, prostate cancer 1994, renal cell carcinoma status post left nephrectomy 1994  Labs: Sodium 137 potassium 5.5 chloride 112 CO2 15 BUN 80 creatinine 8.19 glucose 127 calcium 8.5    Home medications.  Loperamide and multivitamins  Creatinine, Ser  Date/Time Value Ref Range Status  05/28/2020 04:36 AM 8.19 (H) 0.61 - 1.24 mg/dL Final  05/27/2020 01:14 PM 8.20 (H) 0.61 - 1.24 mg/dL Final  05/27/2020 10:41 AM 8.90 (H) 0.61 - 1.24 mg/dL Final  05/18/2020 09:27 AM 4.53 (H) 0.61 - 1.24 mg/dL Final  04/09/2020 08:23 AM 3.19 (H) 0.61 - 1.24 mg/dL Final  11/20/2019 09:10 AM 1.77 (H) 0.61 - 1.24 mg/dL Final  10/14/2019 09:13 AM 1.92 (H) 0.61 - 1.24 mg/dL Final  12/05/2018 11:57 AM 1.46 (H) 0.61 - 1.24 mg/dL Final  10/29/2018 08:30 AM 1.46 (H) 0.61 - 1.24 mg/dL Final  03/18/2018 01:40 PM 1.26 (H) 0.61 - 1.24 mg/dL Final  02/06/2018 10:34 AM 1.28 (H) 0.61 - 1.24  mg/dL Final  12/18/2017 10:35 AM 1.24 0.61 - 1.24 mg/dL Final  07/02/2017 10:45 AM 1.34 (H) 0.61 - 1.24 mg/dL Final  01/12/2017 04:38 AM 1.25 (H) 0.61 - 1.24 mg/dL Final  01/11/2017 04:14 PM 1.33 (H) 0.61 - 1.24 mg/dL Final  01/08/2017 02:04 PM 1.43 (H) 0.61 - 1.24 mg/dL Final  11/16/2016 11:00 AM 1.30 (H) 0.61 - 1.24 mg/dL Final  10/16/2016 10:45 AM 1.09 0.61 - 1.24 mg/dL Final     PMHx:   Past Medical History:  Diagnosis Date  . Anemia   . Anxiety   . Arthritis   . Cancer [facility])    prostate ca and Left kidney and right kidney, bladder  . Chronic kidney disease   . Coronary artery disease   . Depression   . Dyspnea    Not currently  . Hypertension    pt takes no medications  . Skin abrasion    left arm wood fell on it 1 week ago    Past Surgical History:  Procedure Laterality Date  . CHOLECYSTECTOMY    . CORONARY ARTERY BYPASS GRAFT     2009, 5 vessel  . CYSTOSCOPY W/ URETERAL STENT PLACEMENT Right 12/09/2018   Procedure: CYSTOSCOPY WITH RETROGRADE PYELOGRAM/URETEROSCOPY WITH LASER ABLATION OF TUMOR/ URETERAL STENT PLACEMENT;  Surgeon: Raynelle Bring, MD;  Location: [facility];  Service: Urology;  Laterality: Right;  . CYSTOSCOPY WITH FULGERATION N/A 11/16/2016   Procedure: CYSTOSCOPY WITH FULGERATION WITH HOLMIUM LASER;  Surgeon: Raynelle Bring, MD;  Location: [facility];  Service: Urology;  Laterality: N/A;  . CYSTOSCOPY WITH FULGERATION N/A 04/12/2020   Procedure: CYSTOSCOPY, RIGHT RETROGRADE, RIGHT URETEROSCOPY, HOLMIUM LASER RIGHT URETERAL TUMOR, RT URETERAL STENT PLACEMENT;  Surgeon: Raynelle Bring, MD;  Location: [facility];  Service: Urology;  Laterality: N/A;  . CYSTOSCOPY WITH RETROGRADE PYELOGRAM, URETEROSCOPY AND STENT PLACEMENT Right 10/16/2019   Procedure: CYSTOSCOPY WITH RIGHT RETROGRADE PYELOGRAM, URETEROSCOPY AND STENT PLACEMENT;  Surgeon: Raynelle Bring, MD;  [facility];  Service: Urology;  Laterality: Right;  . CYSTOSCOPY WITH URETEROSCOPY AND STENT PLACEMENT N/A  11/16/2016   Procedure: CYSTOSCOPY WITH URETEROSCOPY AND STENT PLACEMENT;  Surgeon: Raynelle Bring, MD;  [facility]: [facility];  Service: Urology;  Laterality: N/A;  . CYSTOSCOPY WITH URETEROSCOPY AND STENT PLACEMENT Right 05/27/2020   Procedure: CYSTOSCOPY / RETROGRADE/URETEROSCOPY /STENT PLACEMENT;  Surgeon: Raynelle Bring, MD;  [facility];  Service: Urology;  Laterality: Right;  . CYSTOSCOPY/RETROGRADE/URETEROSCOPY Right 10/23/2016   Procedure: CYSTOSCOPY/RETROGRADE/URETEROSCOPY/STENT/ BIOPSY/ HOLMIUM LASER/ ABLATION OF TUMOR;  Surgeon: Raynelle Bring, MD;  [facility];  Service: Urology;  Laterality: Right;  . CYSTOSCOPY/RETROGRADE/URETEROSCOPY Right 07/05/2017   Procedure: CYSTOSCOPY/RETROGRADE/URETEROSCOPY/ STENT PLACEMENT;  Surgeon: Raynelle Bring, MD;  [facility];  Service: Urology;  Laterality: Right;  . CYSTOSCOPY/RETROGRADE/URETEROSCOPY Right 11/24/2019   Procedure: CYSTOSCOPY/RETROGRADE/URETEROSCOPY/ URETEROSCOPIC LASER ABLATION OF TUMOR, URETERAL STENT;  Surgeon: Raynelle Bring, MD;  [facility];  Service: Urology;  Laterality: Right;  . CYSTOSCOPY/URETEROSCOPY/HOLMIUM LASER Right 01/11/2017   Procedure: CYSTOSCOPY/RIGHT URETEROSCOPY/HOLMIUM LASER ABLATION OF RIGHT RENAL PELVIC TUMOR/ RIGHT URETERAL STENT PLACEMENT/ FULGARATION OF RIGHT RENAL PELVIS;  Surgeon: Raynelle Bring, MD;  [facility];  Service: Urology;  Laterality: Right;  . EYE SURGERY Bilateral 2018   catarcats   . HOLMIUM LASER APPLICATION  2/70/7867   Procedure: HOLMIUM LASER APPLICATION laser ablation right renal pelvic tumor and biopsy;  Surgeon: Raynelle Bring, MD;  [facility]: [facility];  Service: Urology;;  . Left kidney removed    . PROSTATECTOMY    . TRANSURETHRAL RESECTION OF BLADDER TUMOR Right 12/24/2017   Procedure: TRANSURETHRAL RESECTION OF BLADDER TUMOR (TURBT) WITH CYSTOSCOPY, URETEROSCOPY, LASER ABLATION, URETERAL STENT PLACEMENT;  Surgeon: Raynelle Bring, MD;  [facility];  Service:  Urology;  Laterality: Right;  GENERAL ANESTHESIA WITH PARALYSIS  . TRANSURETHRAL RESECTION OF BLADDER TUMOR N/A 02/11/2018   Procedure: TRANSURETHRAL RESECTION OF BLADDER TUMOR (TURBT) WITH CYSTOSCOPY;  Surgeon: Raynelle Bring, MD;  [facility];  Service: Urology;  Laterality: N/A;  GENERAL ANESTHESIA WITH PARALYSIS  . TRANSURETHRAL RESECTION OF BLADDER TUMOR N/A 03/18/2018   Procedure: TRANSURETHRAL RESECTION OF BLADDER TUMOR (TURBT) WITH CYSTOSCOPY/ RIGHT RETROGRADE PYELOGRAM;  Surgeon: Raynelle Bring, MD;  [facility];  Service: Urology;  Laterality: N/A;  ONLY NEEDS 45 MIN  . TRANSURETHRAL RESECTION OF BLADDER TUMOR Right 10/31/2018   Procedure: TRANSURETHRAL RESECTION OF BLADDER TUMOR (TURBT) WITH CYSTOSCOPY/BILATERAL RETROGRADE/ RIGHT URETEROSCOPY WITH LASER ABLATION OF TUMOR/URETERAL STENT;  Surgeon: Raynelle Bring, MD;  Location: [facility];  Service: Urology;  Laterality: Right;    Family Hx: History reviewed. No pertinent family history.  Social History:  reports that he has quit smoking. He has quit using smokeless tobacco. He reports that he does not drink alcohol and does not use drugs.  Allergies: No Known Allergies  Medications: Prior to Admission medications   Medication Sig Start Date End Date Taking? Authorizing Provider  loperamide (IMODIUM A-D) 2 MG tablet Take 2 mg by mouth 4 (four) times daily as needed for diarrhea or loose stools.  Yes [provider]  Multiple Vitamin (MULTIVITAMIN WITH MINERALS) TABS tablet Take 1 tablet by mouth daily.   Yes [provider]     Labs:  Results for orders placed or performed during the hospital encounter of 05/27/20 (from the past 48 hour(s))  I-STAT, chem 8     Status: Abnormal   Collection Time: 05/27/20 10:41 AM  Result Value Ref Range   Sodium 138 135 - 145 mmol/L   Potassium 5.4 (H) 3.5 - 5.1 mmol/L   Chloride 117 (H) 98 - 111 mmol/L   BUN 66 (H) 8 - 23 mg/dL   Creatinine, Ser 8.90 (H) 0.61 - 1.24  mg/dL   Glucose, Bld 87 70 - 99 mg/dL    Comment: Glucose reference range applies only to samples taken after fasting for at least 8 hours.   Calcium, Ion 1.03 (L) 1.15 - 1.40 mmol/L   TCO2 12 (L) 22 - 32 mmol/L   Hemoglobin 10.5 (L) 13.0 - 17.0 g/dL   HCT 31.0 (L) 39.0 - 50.2 %  Basic metabolic panel     Status: Abnormal   Collection Time: 05/27/20  1:14 PM  Result Value Ref Range   Sodium 136 135 - 145 mmol/L   Potassium 5.4 (H) 3.5 - 5.1 mmol/L   Chloride 113 (H) 98 - 111 mmol/L   CO2 15 (L) 22 - 32 mmol/L   Glucose, Bld 104 (H) 70 - 99 mg/dL    Comment: Glucose reference range applies only to samples taken after fasting for at least 8 hours.   BUN 72 (H) 8 - 23 mg/dL   Creatinine, Ser 8.20 (H) 0.61 - 1.24 mg/dL   Calcium 8.5 (L) 8.9 - 10.3 mg/dL   GFR, Estimated 6 (L) >60 mL/min    Comment: (NOTE) Calculated using the CKD-EPI Creatinine Equation (2021)    Anion gap 8 5 - 15    Comment: Performed at [facility], [place] [address] [place], [place] [address]  Hemoglobin and hematocrit, blood     Status: Abnormal   Collection Time: 05/27/20  1:14 PM  Result Value Ref Range   Hemoglobin 8.6 (L) 13.0 - 17.0 g/dL   HCT 27.7 (L) 39.0 - 52.0 %    Comment: Performed at [facility], [place] [address], [place], [place] [address]  Basic metabolic panel     Status: Abnormal   Collection Time: 05/28/20  4:36 AM  Result Value Ref Range   Sodium 137 135 - 145 mmol/L   Potassium 5.5 (H) 3.5 - 5.1 mmol/L   Chloride 112 (H) 98 - 111 mmol/L   CO2 15 (L) 22 - 32 mmol/L   Glucose, Bld 127 (H) 70 - 99 mg/dL    Comment: Glucose reference range applies only to samples taken after fasting for at least 8 hours.   BUN 80 (H) 8 - 23 mg/dL   Creatinine, Ser 8.19 (H) 0.61 - 1.24 mg/dL   Calcium 8.5 (L) 8.9 - 10.3 mg/dL   GFR, Estimated 6 (L) >60 mL/min    Comment: (NOTE) Calculated using the CKD-EPI Creatinine Equation (2021)    Anion gap 10 5 - 15    Comment:  Performed at [facility], [place] [address] [place], [place] [address]  Hemoglobin and hematocrit, blood     Status: Abnormal   Collection Time: 05/28/20  4:36 AM  Result Value Ref Range   Hemoglobin 8.0 (L) 13.0 - 17.0 g/dL   HCT 24.9 (L)  39.0 - 52.0 %    Comment: Performed at [facility], [place] [address], [place], [place] [address]     ROS:  General: No fever sweats chills Eyes: No visual complaints blurred vision double vision. ENT: Minimal hearing.  No epistaxis or sinusitis Cardiovascular: No chest pain no shortness of breath no ankle leg swelling Respiratory: No coughing wheezing or hemoptysis Abdominal: No abdominal pain nausea vomiting Urogenital: As per HPI Neurologic: No stroke seizures numbness tingling.  Has diffuse weakness Endocrine: No diabetes thyroid or adrenal disease Dermatologic: No skin rash or itching  Physical Exam: Vitals:   05/27/20 2340 05/28/20 0524  BP: 126/73 138/84  Pulse: 67 (!) 50  Resp: 18 18  Temp: (!) 97.5 F (36.4 C) 97.9 F (36.6 C)  SpO2: 99% 98%     General: Elderly comfortable in bed nondistressed lying supine HEENT: Deaf unable to hear well.  Mucous membranes moist Eyes: Extraocular movements intact Neck: JVP not elevated no thyromegaly noted Heart: Regular rate and rhythm no murmurs rubs gallops Lungs: Clear to auscultation Abdomen: Scaphoid abdomen with no organosplenomegaly Extremities: No cyanosis clubbing or edema Skin: Ecchymotic skin thin poor turgor Neuro: Grossly intact with no neurological deficits  Assessment/Plan: 1.Chronic kidney disease, history of solitary kidney, status post nephrectomy left [employer].  With a baseline serum creatinine that has been about 1.5-2 mg/dL in 2021.  Since February 2022 there is been progressive loss of renal function.  History of prostate cancer status post open [facility].  Urothelial carcinoma of the right kidney found to  have a renal collecting system tumor 2018.  Right ureteroscopic tumor resection/laser ablation high-grade urothelial tumor February 2022. 2.  Acute kidney injury.  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  He is refused dialysis in the past.  Underwent endoscopic treatment for palliative management 05/27/2020.  Avoid nephrotoxins.  Avoid IV contrast, avoid nonsteroidal anti-inflammatory drugs, avoid ACE inhibitors and ARB's.  Renally adjust medications for low GFR.  We will continue IV fluids D5 half-normal saline 75 cc an hour.  3.  Anemia status post transfusion 3 units packed red blood cells [place].  Hemoglobin improved to 10 mg/dL.  May be candidate for ESA therapy. 4.  Hyperkalemia.  Could be managed with Lokelma 10 g daily. 5.  Metabolic acidosis we will treat with sodium bicarbonate 650 mg 3 times daily 6.  Coronary artery disease status post CABG 7.  Disposition: Very consistent about not wishing to proceed with dialysis.  He may not do well based on his age.  I would favor medical therapy at this point.  We will continue ongoing discussions with family members regarding goals of care.  At this point would involve palliative medicine in the discussion.   Sherril Croon 05/28/2020, 10:04 AM

## 2020-05-29 DIAGNOSIS — R531 Weakness: Secondary | ICD-10-CM | POA: Diagnosis not present

## 2020-05-29 DIAGNOSIS — E440 Moderate protein-calorie malnutrition: Secondary | ICD-10-CM | POA: Insufficient documentation

## 2020-05-29 DIAGNOSIS — Z515 Encounter for palliative care: Secondary | ICD-10-CM | POA: Diagnosis not present

## 2020-05-29 DIAGNOSIS — Z7189 Other specified counseling: Secondary | ICD-10-CM | POA: Diagnosis not present

## 2020-05-29 LAB — RENAL FUNCTION PANEL
Albumin: 2.7 g/dL — ABNORMAL LOW (ref 3.5–5.0)
Anion gap: 10 (ref 5–15)
BUN: 77 mg/dL — ABNORMAL HIGH (ref 8–23)
CO2: 15 mmol/L — ABNORMAL LOW (ref 22–32)
Calcium: 8.2 mg/dL — ABNORMAL LOW (ref 8.9–10.3)
Chloride: 117 mmol/L — ABNORMAL HIGH (ref 98–111)
Creatinine, Ser: 7.39 mg/dL — ABNORMAL HIGH (ref 0.61–1.24)
GFR, Estimated: 7 mL/min — ABNORMAL LOW (ref >60)
Glucose, Bld: 84 mg/dL (ref 70–99)
Phosphorus: 5.6 mg/dL — ABNORMAL HIGH (ref 2.5–4.6)
Potassium: 4.9 mmol/L (ref 3.5–5.1)
Sodium: 142 mmol/L (ref 135–145)

## 2020-05-29 LAB — PARATHYROID HORMONE, INTACT (NO CA): PTH: 38 pg/mL (ref 15–65)

## 2020-05-29 LAB — HEMOGLOBIN AND HEMATOCRIT, BLOOD
HCT: 25.1 % — ABNORMAL LOW (ref 39.0–52.0)
Hemoglobin: 7.9 g/dL — ABNORMAL LOW (ref 13.0–17.0)

## 2020-05-29 MED ORDER — SODIUM BICARBONATE 650 MG PO TABS
1300.0000 mg | ORAL_TABLET | Freq: Three times a day (TID) | ORAL | Status: DC
  Administered 2020-05-29 – 2020-06-02 (×12): 1300 mg via ORAL
  Filled 2020-05-29 (×12): qty 2

## 2020-05-29 MED ORDER — DEXTROSE-NACL 5-0.45 % IV SOLN: INTRAVENOUS | Status: DC

## 2020-05-29 MED ORDER — SODIUM CHLORIDE 0.9 % IV SOLN
500.0000 mg | INTRAVENOUS | Status: AC
  Administered 2020-05-29 – 2020-05-30 (×2): 500 mg via INTRAVENOUS
  Filled 2020-05-29 (×2): qty 25

## 2020-05-29 NOTE — Consult Note (Signed)
Consultation Note Date: 05/29/2020   Patient Name: Wesley Snyder.  DOB: [birth year]  MRN: [id number]  Age / Sex: 87 y.o., male  PCP: Majel Homer, [place] Referring Physician: Raynelle Bring, MD  Reason for Consultation: Establishing goals of care  HPI/Patient Profile: 87 y.o. male  admitted on 05/27/2020    Clinical Assessment and Goals of Care: 87 year old gentleman with a life limiting illness of high-grade urothelial carcinoma of the right renal pelvis and solitary right kidney.  Patient admitted with anemia and acute kidney injury on top of chronic kidney disease.  Seen and evaluated by nephrology, remains admitted to urology service.   Patient has chronic kidney disease, history of solitary kidney, status post left nephrectomy at [facility] in 1994.  Patient has had gradual progressive loss of renal function since the last couple of months.  History of prostate cancer in the past.  It has been noted that the patient has refused nephroureterectomy in the past as well as has refused dialysis in the past.  Patient has received blood transfusions and other supportive treatments such as Lokelma and sodium bicarbonate.  He is on IV fluids.  Palliative service for ongoing goals of care discussions has been requested.  Patient is resting in bed.  He appears in no acute distress.  Patient requires assistance with activities of daily living.  Is hard of hearing.  His son is present at the bedside.  Introduced myself and palliative care as follows:  Palliative medicine is specialized medical care for people living with serious illness. It focuses on providing relief from the symptoms and stress of a serious illness. The goal is to improve quality of life for both the patient and the family.  Goals of care: Broad aims of medical therapy in relation to the patient's values and preferences. Our aim is to provide medical care  aimed at enabling patients to achieve the goals that matter most to them, given the circumstances of their particular medical situation and their constraints.   Brief life review performed.  Patient lives in [place] with his wife.  Patient's son lives in [place], [place].  Patient's son is tearful and states he feels overwhelmed.  He states that they have had a lot of health emergencies and the deaths in their immediate family in the past several months.  Offered active listening and supportive care.  Provided compassionate presence.  Gently given overview about how supportive care such as palliative/hospice services can help.  See below.  Thank you for the consult.  NEXT OF KIN Lives at home with his wife, has a son.  SUMMARY OF RECOMMENDATIONS   Agree with DO NOT RESUSCITATE Continue current mode of care Early palliative care, goals of care discussions initiated at the bedside, mostly with the patient's son.  Patient lives at home with his wife in [place].  Patient's son lives in [place], [place].  At this time, at the time of initial palliative encounter, brief overview of differences between hospice and palliative care were discussed.  Briefly  discussed about various kinds of disposition options such as nursing facility with palliative versus home with home-based hospice support. Patient son wishes to continue current mode of care as a time trial for the next 24 to 48 hours.  He wants to discuss on Monday about further goals of care. PMT to follow.  Code Status/Advance Care Planning:  DNR    Symptom Management:   As above  Palliative Prophylaxis:   Delirium Protocol   Psycho-social/Spiritual:   Desire for further Chaplaincy support:yes  Additional Recommendations: Caregiving  Support/Resources  Prognosis:   Unable to determine  Discharge Planning: To Be Determined      Primary Diagnoses: Present on Admission: . Acute renal failure ([place])   I  have reviewed the medical record, interviewed the patient and family, and examined the patient. The following aspects are pertinent.  Past Medical History:  Diagnosis Date  . Anemia   . Anxiety   . Arthritis   . Cancer [facility])    prostate ca and Left kidney and right kidney, bladder  . Chronic kidney disease   . Coronary artery disease   . Depression   . Dyspnea    Not currently  . Hypertension    pt takes no medications  . Skin abrasion    left arm wood fell on it 1 week ago   Social History   Socioeconomic History  . Marital status: Married    Spouse name: Not on file  . Number of children: Not on file  . Years of education: Not on file  . Highest education level: Not on file  Occupational History  . Not on file  Tobacco Use  . Smoking status: Former Research scientist (life sciences)  . Smokeless tobacco: Former Systems developer  . Tobacco comment: quit when 87 years old   Vaping Use  . Vaping Use: Never used  Substance and Sexual Activity  . Alcohol use: No  . Drug use: No  . Sexual activity: Not Currently  Other Topics Concern  . Not on file  Social History Narrative  . Not on file   Social Determinants of Health   Financial Resource Strain: Not on file  Food Insecurity: Not on file  Transportation Needs: Not on file  Physical Activity: Not on file  Stress: Not on file  Social Connections: Not on file   History reviewed. No pertinent family history. Scheduled Meds: . (feeding supplement) PROSource Plus  30 mL Oral Daily  . Chlorhexidine Gluconate Cloth  6 each Topical Daily  . docusate sodium  100 mg Oral BID  . doxycycline  100 mg Oral Q12H  . feeding supplement (NEPRO CARB STEADY)  237 mL Oral Q24H  . sodium bicarbonate  650 mg Oral TID  . sodium zirconium cyclosilicate  10 g Oral Daily   Continuous Infusions: . sodium chloride 75 mL/hr at 05/29/20 0824   PRN Meds:.acetaminophen, opium-belladonna, diphenhydrAMINE **OR** diphenhydrAMINE, HYDROcodone-acetaminophen, ondansetron,  zolpidem Medications Prior to Admission:  Prior to Admission medications   Medication Sig Start Date End Date Taking? Authorizing Provider  loperamide (IMODIUM A-D) 2 MG tablet Take 2 mg by mouth 4 (four) times daily as needed for diarrhea or loose stools.   Yes [provider]  Multiple Vitamin (MULTIVITAMIN WITH MINERALS) TABS tablet Take 1 tablet by mouth daily.   Yes [provider]   No Known Allergies Review of Systems Denies pain  Physical Exam Frail elderly gentleman Requires assistance with most activities Appears to have regular work of breathing No distress Appears  chronically ill No edema Awake alert, hard of hearing, verbalizes some  Vital Signs: BP 129/80 (BP Location: Right Arm)   Pulse (!) 48   Temp 97.9 F (36.6 C) (Oral)   Resp 20   Ht 5\' 11"  (1.803 m)   Wt 63.2 kg   SpO2 98%   BMI 19.43 kg/m  Pain Scale: 0-10   Pain Score: 0-No pain   SpO2: SpO2: 98 % O2 Device:SpO2: 98 % O2 Flow Rate: .   IO: Intake/output summary:   Intake/Output Summary (Last 24 hours) at 05/29/2020 1034 Last data filed at 05/29/2020 8416 Gross per 24 hour  Intake 1247.28 ml  Output 2400 ml  Net -1152.72 ml    LBM: Last BM Date: 05/28/20 Baseline Weight: Weight: 63 kg Most recent weight: Weight: 63.2 kg     Palliative Assessment/Data:   PPS 50%  Time In:  9 Time Out: 10   Time Total:  60  Greater than 50%  of this time was spent counseling and coordinating care related to the above assessment and plan.  Signed by: Loistine Chance, MD   Please contact Palliative Medicine Team phone at [phone] for questions and concerns.  For individual provider: See Shea Evans

## 2020-05-29 NOTE — Progress Notes (Signed)
2 Days Post-Op Subjective: Patient sleeping. Son at bedside. No current issues w/ pain or catheter related issues. Urine bloody.   Objective: Vital signs in last 24 hours: Temp:  [97.6 F (36.4 C)-97.9 F (36.6 C)] 97.9 F (36.6 C) (04/02 0623) Pulse Rate:  [48-52] 48 (04/02 0623) Resp:  [16-20] 20 (04/02 0623) BP: (127-144)/(63-80) 129/80 (04/02 0623) SpO2:  [97 %-100 %] 98 % (04/02 0623)  Intake/Output from previous day: 04/01 0701 - 04/02 0700 In: 1247.3 [P.O.:240; I.V.:1007.3] Out: 1200 [Urine:1200] Intake/Output this shift: Total I/O In: -  Out: 1200 [Urine:1200]  Physical Exam:  Constitutional: Vital signs reviewed. Pt asleep   Lab Results: Recent Labs    05/27/20 1314 05/28/20 0436 05/29/20 0704  HGB 8.6* 8.0* 7.9*  HCT 27.7* 24.9* 25.1*   BMET Recent Labs    05/28/20 1237 05/29/20 0521  NA 136 142  K 5.0 4.9  CL 113* 117*  CO2 15* 15*  GLUCOSE 112* 84  BUN 83* 77*  CREATININE 8.02* 7.39*  CALCIUM 8.3* 8.2*   No results for input(s): LABPT, INR in the last 72 hours. No results for input(s): LABURIN in the last 72 hours. Results for orders placed or performed during the hospital encounter of 05/24/20  SARS CORONAVIRUS 2 (TAT 6-24 HRS) Nasopharyngeal Nasopharyngeal Swab     Status: None   Collection Time: 05/24/20 11:48 AM   Specimen: Nasopharyngeal Swab  Result Value Ref Range Status   SARS Coronavirus 2 NEGATIVE NEGATIVE Final    Comment: (NOTE) SARS-CoV-2 target nucleic acids are NOT DETECTED.  The SARS-CoV-2 RNA is generally detectable in upper and lower respiratory specimens during the acute phase of infection. Negative results do not preclude SARS-CoV-2 infection, do not rule out co-infections with other pathogens, and should not be used as the sole basis for treatment or other patient management decisions. Negative results must be combined with clinical observations, patient history, and epidemiological information. The expected result  is Negative.  Fact Sheet for Patients: SugarRoll.be  Fact Sheet for Healthcare Providers: https://www.woods-mathews.com/  This test is not yet approved or cleared by the [place] FDA and  has been authorized for detection and/or diagnosis of SARS-CoV-2 by FDA under an Emergency Use Authorization (EUA). This EUA will remain  in effect (meaning this test can be used) for the duration of the COVID-19 declaration under Se ction 564(b)(1) of the Act, 21 U.S.C. section 360bbb-3(b)(1), unless the authorization is terminated or revoked sooner.  Performed at [facility], [place] [address], [place], [place] [address]     Studies/Results: DG C-Arm 1-60 Min-No Report  Result Date: 05/27/2020 Fluoroscopy was utilized by the requesting physician.  No radiographic interpretation.    Assessment/Plan: 1) High grade urothelial carcinoma of the right renal pelvis in solitary right kidney:  He has refused nephroureterectomy (due to refusal to consider dialysis) and has been undergoing endoscopic treatment for palliative management over the past couple of years.  He has persistent hematuria a bit currently is minimal   2) Anemia:   Hemoglobin stable at 7.9  3) AKI/CKD:   Creatinine 7.39, slightly less.  Not dialysis candidate  4) Disposition:   Brief conversation with son.  No significant intervention at this point.  Agree with palliative care   LOS: 2 days   Jorja Loa 05/29/2020, 9:36 AM

## 2020-05-29 NOTE — Progress Notes (Signed)
[facility] ROUNDING NOTE   Subjective:   Brief history: Complicated urological history receiving palliative therapy for uroepithelial tumor involving the right renal pelvis.  Progressive loss of renal function over the past 2 to 3 months.  Status post treatment in February 2022 baseline serum creatinine 1.5 to 2 mg/dL.  Does not wish to undergo dialysis and has been adamant in decision.  Underwent cystoscopy and retrograde/ureteroscopy/laser ablation of tumor and stent placement to the right urinary system.  Continues to have ongoing hematuria.  Blood pressure 129/80 pulse 54 temperature 97.9 O2 sats 98% room air  Sodium 142 potassium 4.9 chloride 117 CO2 15 BUN 77 creatinine 7.39 glucose 84 calcium 8.2 hemoglobin 7.9 iron saturation 6%  Sodium bicarbonate 650 mg 3 times daily Lokelma 10 g daily   Objective:  Vital signs in last 24 hours:  Temp:  [97.6 F (36.4 C)-97.9 F (36.6 C)] 97.9 F (36.6 C) (04/02 0623) Pulse Rate:  [48-52] 48 (04/02 0623) Resp:  [16-20] 20 (04/02 0623) BP: (127-144)/(63-80) 129/80 (04/02 0623) SpO2:  [97 %-100 %] 98 % (04/02 0623)  Weight change:  Filed Weights   05/27/20 1041 05/27/20 1518  Weight: 63 kg 63.2 kg    Intake/Output: I/O last 3 completed shifts: In: 1965.9 [P.O.:240; I.V.:1725.9] Out: 1400 [Urine:1400]   Intake/Output this shift:  Total I/O In: -  Out: 1200 [Urine:1200]  Alert nondistressed CVS- RRR no murmurs rubs gallops RS- CTA no wheeze or rales ABD- BS present soft non-distended EXT- no edema   Basic Metabolic Panel: Recent Labs  Lab 05/27/20 1041 05/27/20 1314 05/27/20 1314 05/28/20 0436 05/28/20 1237 05/29/20 0521  NA 138 136  --  137 136 142  K 5.4* 5.4*  --  5.5* 5.0 4.9  CL 117* 113*  --  112* 113* 117*  CO2  --  15*  --  15* 15* 15*  GLUCOSE 87 104*  --  127* 112* 84  BUN 66* 72*  --  80* 83* 77*  CREATININE 8.90* 8.20*  --  8.19* 8.02* 7.39*  CALCIUM  --  8.5*   < > 8.5* 8.3* 8.2*   PHOS  --   --   --   --   --  5.6*   < > = values in this interval not displayed.    Liver Function Tests: Recent Labs  Lab 05/29/20 0521  ALBUMIN 2.7*   No results for input(s): LIPASE, AMYLASE in the last 168 hours. No results for input(s): AMMONIA in the last 168 hours.  CBC: Recent Labs  Lab 05/27/20 1041 05/27/20 1314 05/28/20 0436 05/29/20 0704  HGB 10.5* 8.6* 8.0* 7.9*  HCT 31.0* 27.7* 24.9* 25.1*    Cardiac Enzymes: No results for input(s): CKTOTAL, CKMB, CKMBINDEX, TROPONINI in the last 168 hours.  BNP: Invalid input(s): POCBNP  CBG: No results for input(s): GLUCAP in the last 168 hours.  Microbiology: Results for orders placed or performed during the hospital encounter of 05/24/20  SARS CORONAVIRUS 2 (TAT 6-24 HRS) Nasopharyngeal Nasopharyngeal Swab     Status: None   Collection Time: 05/24/20 11:48 AM   Specimen: Nasopharyngeal Swab  Result Value Ref Range Status   SARS Coronavirus 2 NEGATIVE NEGATIVE Final    Comment: (NOTE) SARS-CoV-2 target nucleic acids are NOT DETECTED.  The SARS-CoV-2 RNA is generally detectable in upper and lower respiratory specimens during the acute phase of infection. Negative results do not preclude SARS-CoV-2 infection, do not rule out co-infections with other pathogens, and should not be  used as the sole basis for treatment or other patient management decisions. Negative results must be combined with clinical observations, patient history, and epidemiological information. The expected result is Negative.  Fact Sheet for Patients: SugarRoll.be  Fact Sheet for Healthcare Providers: https://www.woods-mathews.com/  This test is not yet approved or cleared by the [place] FDA and  has been authorized for detection and/or diagnosis of SARS-CoV-2 by FDA under an Emergency Use Authorization (EUA). This EUA will remain  in effect (meaning this test can be used) for the duration  of the COVID-19 declaration under Se ction 564(b)(1) of the Act, 21 U.S.C. section 360bbb-3(b)(1), unless the authorization is terminated or revoked sooner.  Performed at [facility], [place] [address] [place], [place] [address]     Coagulation Studies: No results for input(s): LABPROT, INR in the last 72 hours.  Urinalysis: Recent Labs    05/28/20 1129  COLORURINE RED*  LABSPEC TEST NOT REPORTED DUE TO COLOR INTERFERENCE OF URINE PIGMENT  PHURINE TEST NOT REPORTED DUE TO COLOR INTERFERENCE OF URINE PIGMENT  GLUCOSEU TEST NOT REPORTED DUE TO COLOR INTERFERENCE OF URINE PIGMENT*  HGBUR TEST NOT REPORTED DUE TO COLOR INTERFERENCE OF URINE PIGMENT*  BILIRUBINUR TEST NOT REPORTED DUE TO COLOR INTERFERENCE OF URINE PIGMENT*  KETONESUR TEST NOT REPORTED DUE TO COLOR INTERFERENCE OF URINE PIGMENT*  PROTEINUR 100*  NITRITE TEST NOT REPORTED DUE TO COLOR INTERFERENCE OF URINE PIGMENT*  LEUKOCYTESUR TEST NOT REPORTED DUE TO COLOR INTERFERENCE OF URINE PIGMENT*      Imaging: DG C-Arm 1-60 Min-No Report  Result Date: 05/27/2020 Fluoroscopy was utilized by the requesting physician.  No radiographic interpretation.     Medications:   . sodium chloride 75 mL/hr at 05/29/20 0824   . (feeding supplement) PROSource Plus  30 mL Oral Daily  . Chlorhexidine Gluconate Cloth  6 each Topical Daily  . docusate sodium  100 mg Oral BID  . doxycycline  100 mg Oral Q12H  . feeding supplement (NEPRO CARB STEADY)  237 mL Oral Q24H  . sodium bicarbonate  650 mg Oral TID  . sodium zirconium cyclosilicate  10 g Oral Daily   acetaminophen, opium-belladonna, diphenhydrAMINE **OR** diphenhydrAMINE, HYDROcodone-acetaminophen, ondansetron, zolpidem  Assessment/ Plan:  1.Chronic kidney disease, history of solitary kidney, status post nephrectomy left [employer].  With a baseline serum creatinine that has been about 1.5-2 mg/dL in 2021.  Since February 2022 there is been progressive loss of  renal function.  History of prostate cancer status post open RRP [facility].  Urothelial carcinoma of the right kidney found to have a renal collecting system tumor 2018.  Right ureteroscopic tumor resection/laser ablation high-grade urothelial tumor February 2022. 2.  Acute kidney injury.  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  He is refused dialysis in the past.  Underwent endoscopic treatment for palliative management 05/27/2020.  Avoid nephrotoxins.  Avoid IV contrast, avoid nonsteroidal anti-inflammatory drugs, avoid ACE inhibitors and ARB's.  Renally adjust medications for low GFR.  We will continue IV fluids D5 half-normal saline 75 cc an hour.   Renal function appears to be slightly improved we will continue to follow. 3.  Anemia status post transfusion 3 units packed red blood cells [place].    Iron deficiency anemia we will give IV iron 4.  Hyperkalemia.  Could be managed with Lokelma 10 g daily. 5.  Metabolic acidosis we will increase sodium bicarbonate to 1.3 g  3 times daily 6.  Coronary artery disease status post CABG 7.  Disposition: Very consistent about not wishing to proceed with dialysis.  He may not do well based on his age.  I would favor medical therapy at this point.  We will continue ongoing discussions with family members regarding goals of care.  At this point would involve palliative medicine in the discussion.   LOS: [place] @TODAY @11 :05 AM

## 2020-05-30 LAB — RENAL FUNCTION PANEL
Albumin: 2.5 g/dL — ABNORMAL LOW (ref 3.5–5.0)
Anion gap: 8 (ref 5–15)
BUN: 71 mg/dL — ABNORMAL HIGH (ref 8–23)
CO2: 17 mmol/L — ABNORMAL LOW (ref 22–32)
Calcium: 8.3 mg/dL — ABNORMAL LOW (ref 8.9–10.3)
Chloride: 114 mmol/L — ABNORMAL HIGH (ref 98–111)
Creatinine, Ser: 6.55 mg/dL — ABNORMAL HIGH (ref 0.61–1.24)
GFR, Estimated: 8 mL/min — ABNORMAL LOW (ref >60)
Glucose, Bld: 98 mg/dL (ref 70–99)
Phosphorus: 5 mg/dL — ABNORMAL HIGH (ref 2.5–4.6)
Potassium: 5.1 mmol/L (ref 3.5–5.1)
Sodium: 139 mmol/L (ref 135–145)

## 2020-05-30 LAB — BASIC METABOLIC PANEL
Anion gap: 8 (ref 5–15)
BUN: 70 mg/dL — ABNORMAL HIGH (ref 8–23)
CO2: 17 mmol/L — ABNORMAL LOW (ref 22–32)
Calcium: 8.3 mg/dL — ABNORMAL LOW (ref 8.9–10.3)
Chloride: 115 mmol/L — ABNORMAL HIGH (ref 98–111)
Creatinine, Ser: 6.48 mg/dL — ABNORMAL HIGH (ref 0.61–1.24)
GFR, Estimated: 8 mL/min — ABNORMAL LOW (ref >60)
Glucose, Bld: 97 mg/dL (ref 70–99)
Potassium: 4.7 mmol/L (ref 3.5–5.1)
Sodium: 140 mmol/L (ref 135–145)

## 2020-05-30 LAB — HEMOGLOBIN AND HEMATOCRIT, BLOOD
HCT: 25.7 % — ABNORMAL LOW (ref 39.0–52.0)
Hemoglobin: 8.1 g/dL — ABNORMAL LOW (ref 13.0–17.0)

## 2020-05-30 NOTE — Progress Notes (Signed)
[facility] ROUNDING NOTE   Subjective:   Brief history: Complicated urological history receiving palliative therapy for uroepithelial tumor involving the right renal pelvis.  Progressive loss of renal function over the past 2 to 3 months.  Status post treatment in February 2022 baseline serum creatinine 1.5 to 2 mg/dL.  Does not wish to undergo dialysis and has been adamant in decision.  Underwent cystoscopy and retrograde/ureteroscopy/laser ablation of tumor and stent placement to the right urinary system.  Continues to have ongoing hematuria.  Blood pressure 134/70 pulse 57 temperature 98.5 O2 sats 96% room air  Sodium 139 potassium 5.1 chloride 115 CO2 17 glucose 98 BUN 71 creatinine 6.55 calcium 8.3 phosphorus 5 albumin 2.5 hemoglobin 8.5  Sodium bicarbonate 1.3 g   3 times daily Lokelma 10 g daily   Objective:  Vital signs in last 24 hours:  Temp:  [97.5 F (36.4 C)-98.5 F (36.9 C)] 98.5 F (36.9 C) (04/03 0423) Pulse Rate:  [48-71] 57 (04/03 0423) Resp:  [16-18] 16 (04/03 0423) BP: (109-170)/(59-81) 134/70 (04/03 0423) SpO2:  [96 %-100 %] 96 % (04/03 0423)  Weight change:  Filed Weights   05/27/20 1041 05/27/20 1518  Weight: 63 kg 63.2 kg    Intake/Output: I/O last 3 completed shifts: In: 2341.9 [P.O.:840; I.V.:1228; IV Piggyback:273.9] Out: 3250 [Urine:3250]   Intake/Output this shift:  Total I/O In: 52 [P.O.:45] Out: -   Alert nondistressed CVS- RRR no murmurs rubs gallops RS- CTA no wheeze or rales ABD- BS present soft non-distended EXT- no edema   Basic Metabolic Panel: Recent Labs  Lab 05/27/20 1314 05/28/20 0436 05/28/20 1237 05/29/20 0521 05/30/20 0448  NA 136 137 136 142 140  139  K 5.4* 5.5* 5.0 4.9 4.7  5.1  CL 113* 112* 113* 117* 115*  114*  CO2 15* 15* 15* 15* 17*  17*  GLUCOSE 104* 127* 112* 84 97  98  BUN 72* 80* 83* 77* 70*  71*  CREATININE 8.20* 8.19* 8.02* 7.39* 6.48*  6.55*  CALCIUM 8.5* 8.5* 8.3* 8.2*  8.3*  8.3*  PHOS  --   --   --  5.6* 5.0*    Liver Function Tests: Recent Labs  Lab 05/29/20 0521 05/30/20 0448  ALBUMIN 2.7* 2.5*   No results for input(s): LIPASE, AMYLASE in the last 168 hours. No results for input(s): AMMONIA in the last 168 hours.  CBC: Recent Labs  Lab 05/27/20 1041 05/27/20 1314 05/28/20 0436 05/29/20 0704 05/30/20 0448  HGB 10.5* 8.6* 8.0* 7.9* 8.1*  HCT 31.0* 27.7* 24.9* 25.1* 25.7*    Cardiac Enzymes: No results for input(s): CKTOTAL, CKMB, CKMBINDEX, TROPONINI in the last 168 hours.  BNP: Invalid input(s): POCBNP  CBG: No results for input(s): GLUCAP in the last 168 hours.  Microbiology: Results for orders placed or performed during the hospital encounter of 05/24/20  SARS CORONAVIRUS 2 (TAT 6-24 HRS) Nasopharyngeal Nasopharyngeal Swab     Status: None   Collection Time: 05/24/20 11:48 AM   Specimen: Nasopharyngeal Swab  Result Value Ref Range Status   SARS Coronavirus 2 NEGATIVE NEGATIVE Final    Comment: (NOTE) SARS-CoV-2 target nucleic acids are NOT DETECTED.  The SARS-CoV-2 RNA is generally detectable in upper and lower respiratory specimens during the acute phase of infection. Negative results do not preclude SARS-CoV-2 infection, do not rule out co-infections with other pathogens, and should not be used as the sole basis for treatment or other patient management decisions. Negative results must be combined with clinical  observations, patient history, and epidemiological information. The expected result is Negative.  Fact Sheet for Patients: SugarRoll.be  Fact Sheet for Healthcare Providers: https://www.woods-mathews.com/  This test is not yet approved or cleared by the [place] FDA and  has been authorized for detection and/or diagnosis of SARS-CoV-2 by FDA under an Emergency Use Authorization (EUA). This EUA will remain  in effect (meaning this test can be used) for the  duration of the COVID-19 declaration under Se ction 564(b)(1) of the Act, 21 U.S.C. section 360bbb-3(b)(1), unless the authorization is terminated or revoked sooner.  Performed at [facility], [place] [address] [place], [place] [address]     Coagulation Studies: No results for input(s): LABPROT, INR in the last 72 hours.  Urinalysis: Recent Labs    05/28/20 1129  COLORURINE RED*  LABSPEC TEST NOT REPORTED DUE TO COLOR INTERFERENCE OF URINE PIGMENT  PHURINE TEST NOT REPORTED DUE TO COLOR INTERFERENCE OF URINE PIGMENT  GLUCOSEU TEST NOT REPORTED DUE TO COLOR INTERFERENCE OF URINE PIGMENT*  HGBUR TEST NOT REPORTED DUE TO COLOR INTERFERENCE OF URINE PIGMENT*  BILIRUBINUR TEST NOT REPORTED DUE TO COLOR INTERFERENCE OF URINE PIGMENT*  KETONESUR TEST NOT REPORTED DUE TO COLOR INTERFERENCE OF URINE PIGMENT*  PROTEINUR 100*  NITRITE TEST NOT REPORTED DUE TO COLOR INTERFERENCE OF URINE PIGMENT*  LEUKOCYTESUR TEST NOT REPORTED DUE TO COLOR INTERFERENCE OF URINE PIGMENT*      Imaging: No results found.   Medications:   . dextrose 5 % and 0.45% NaCl 75 mL/hr at 05/30/20 0726  . iron sucrose 500 mg (05/29/20 1326)   . (feeding supplement) PROSource Plus  30 mL Oral Daily  . Chlorhexidine Gluconate Cloth  6 each Topical Daily  . docusate sodium  100 mg Oral BID  . doxycycline  100 mg Oral Q12H  . feeding supplement (NEPRO CARB STEADY)  237 mL Oral Q24H  . sodium bicarbonate  1,300 mg Oral TID  . sodium zirconium cyclosilicate  10 g Oral Daily   acetaminophen, opium-belladonna, diphenhydrAMINE **OR** diphenhydrAMINE, HYDROcodone-acetaminophen, ondansetron, zolpidem  Assessment/ Plan:  1.Chronic kidney disease, history of solitary kidney, status post nephrectomy left [employer].  With a baseline serum creatinine that has been about 1.5-2 mg/dL in 2021.  Since February 2022 there is been progressive loss of renal function.  History of prostate cancer status post open  RRP [facility].  Urothelial carcinoma of the right kidney found to have a renal collecting system tumor 2018.  Right ureteroscopic tumor resection/laser ablation high-grade urothelial tumor February 2022. 2.  Acute kidney injury.  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  He is refused dialysis in the past.  Underwent endoscopic treatment for palliative management 05/27/2020.  Avoid nephrotoxins.  Avoid IV contrast, avoid nonsteroidal anti-inflammatory drugs, avoid ACE inhibitors and ARB's.  Renally adjust medications for low GFR.  We will continue IV fluids D5 half-normal saline 75 cc an hour.   Renal function appears to be slightly improved we will continue to follow. 3.  Anemia status post transfusion 3 units packed red blood cells [place].    Iron deficiency anemia have given IV iron 500 mg Venofer every 24 hours x2 4.  Hyperkalemia.  Could be managed with Lokelma 10 g daily. 5.  Metabolic acidosis we will increased sodium bicarbonate to 1.3 g 3 times daily 6.  Coronary artery disease status post CABG 7.  Disposition: Very consistent about not wishing to  proceed with dialysis.  He may not do well based on his age.  I would favor medical therapy at this point.  We will continue ongoing discussions with family members regarding goals of care.  At this point would involve palliative medicine in the discussion.   LOS: [place] @TODAY @9 :50 AM

## 2020-05-31 DIAGNOSIS — Z515 Encounter for palliative care: Secondary | ICD-10-CM | POA: Diagnosis not present

## 2020-05-31 DIAGNOSIS — Z7189 Other specified counseling: Secondary | ICD-10-CM | POA: Diagnosis not present

## 2020-05-31 DIAGNOSIS — E440 Moderate protein-calorie malnutrition: Secondary | ICD-10-CM

## 2020-05-31 DIAGNOSIS — N179 Acute kidney failure, unspecified: Secondary | ICD-10-CM | POA: Diagnosis not present

## 2020-05-31 LAB — RENAL FUNCTION PANEL
Albumin: 2.4 g/dL — ABNORMAL LOW (ref 3.5–5.0)
Anion gap: 6 (ref 5–15)
BUN: 58 mg/dL — ABNORMAL HIGH (ref 8–23)
CO2: 18 mmol/L — ABNORMAL LOW (ref 22–32)
Calcium: 8 mg/dL — ABNORMAL LOW (ref 8.9–10.3)
Chloride: 114 mmol/L — ABNORMAL HIGH (ref 98–111)
Creatinine, Ser: 5.17 mg/dL — ABNORMAL HIGH (ref 0.61–1.24)
GFR, Estimated: 10 mL/min — ABNORMAL LOW (ref >60)
Glucose, Bld: 93 mg/dL (ref 70–99)
Phosphorus: 4.2 mg/dL (ref 2.5–4.6)
Potassium: 4.2 mmol/L (ref 3.5–5.1)
Sodium: 138 mmol/L (ref 135–145)

## 2020-05-31 LAB — HEMOGLOBIN AND HEMATOCRIT, BLOOD
HCT: 24.1 % — ABNORMAL LOW (ref 39.0–52.0)
Hemoglobin: 7.6 g/dL — ABNORMAL LOW (ref 13.0–17.0)

## 2020-05-31 NOTE — Care Management Important Message (Signed)
Important Message  Patient Details IM Letter given to the Patient. Name: Wesley Snyder. MRN: [id number] Date of Birth: June 19, 1932   Medicare Important Message Given:  Yes     Kerin Salen 05/31/2020, 1:23 PM

## 2020-05-31 NOTE — Evaluation (Signed)
Physical Therapy Evaluation Patient Details Name: Wesley Snyder. MRN: [id number] DOB: [birth year] Today's Date: 05/31/2020   History of Present Illness  Wesley Snyder. admitted 05/27/20 for surgery, with the diagnosis of UROTHELIAL CARCINOMA OF THE RIGHT RENAL PELVIS. S/P:  CYSTOSCOPY / RETROGRADE/URETEROSCOPY / LASER ABLATION OF TUMOR AND STENT PLACEMENT (Right  Clinical Impression  The patient  Received in bed, had just urinated in urinal without success. Assisted patient to sitting,. Stood  And urinated more. Placed briefs and pad. Patient ambulated x 60' using RW, requires min assistance for safety. Patient has a  Right foot drop previously from an injury. Son present. Patient does not use a brace on right  Foot.  Plans are for patient to return home. REcommend HHPT.  Pt admitted with above diagnosis.  Pt currently with functional limitations due to the deficits listed below (see PT Problem List). Pt will benefit from skilled PT to increase their independence and safety with mobility to allow discharge to the venue listed below.     Follow Up Recommendations Home health PT    Equipment Recommendations  None recommended by PT    Recommendations for Other Services       Precautions / Restrictions Precautions Precautions: Fall Precaution Comments: dribbles urine, needs pad. Has Right foot drop , Present PTA from injury      Mobility  Bed Mobility Overal bed mobility: Needs Assistance Bed Mobility: Supine to Sit     Supine to sit: Min guard     General bed mobility comments: extra time    Transfers Overall transfer level: Needs assistance Equipment used: Rolling walker (2 wheeled) Transfers: Sit to/from Stand Sit to Stand: Min assist         General transfer comment: patint stood up and urinated.  Placed  a pad for patient to ambulate.  Ambulation/Gait Ambulation/Gait assistance: Min assist Gait Distance (Feet): 60 Feet Assistive device: Rolling walker (2  wheeled) Gait Pattern/deviations: Step-to pattern;Steppage;Decreased stride length Gait velocity: decr   General Gait Details: Steady assistto ambukate using RW.  Stairs            Wheelchair Mobility    Modified Rankin (Stroke Patients Only)       Balance Overall balance assessment: History of Falls;Needs assistance Sitting-balance support: Feet supported;No upper extremity supported Sitting balance-Leahy Scale: Good     Standing balance support: During functional activity;Bilateral upper extremity supported Standing balance-Leahy Scale: Fair Standing balance comment: reliant on support of Rw  for dynamic gait                             Pertinent Vitals/Pain Pain Assessment: No/denies pain    Home Living Family/patient expects to be discharged to:: Private residence Living Arrangements: Spouse/significant other Available Help at Discharge: Family;Available 24 hours/day;Personal care attendant Type of Home: House Home Access: Stairs to enter   [employer] of Steps: 1 Home Layout: One level Home Equipment: Walker - 2 wheels;Cane - single point      Prior Function Level of Independence: Needs assistance   Gait / Transfers Assistance Needed: ambulates with a cane.  ADL's / Homemaking Assistance Needed: sponge bathes        Hand Dominance        Extremity/Trunk Assessment   Upper Extremity Assessment Upper Extremity Assessment: Generalized weakness    Lower Extremity Assessment Lower Extremity Assessment: Generalized weakness    Cervical / Trunk Assessment Cervical / Trunk Assessment:  Normal  Communication      Cognition Arousal/Alertness: Awake/alert Behavior During Therapy: WFL for tasks assessed/performed Overall Cognitive Status: Impaired/Different from baseline Area of Impairment: Orientation                               General Comments: slow to respond, is HOH, basically oriented to situation,  referred to son for information      General Comments      Exercises     Assessment/Plan    PT Assessment Patient needs continued PT services  PT Problem List Decreased strength;Decreased mobility;Decreased safety awareness;Decreased activity tolerance;Decreased balance;Decreased knowledge of use of DME       PT Treatment Interventions DME instruction;Therapeutic activities;Gait training;Therapeutic exercise;Patient/family education;Functional mobility training;Balance training    PT Goals (Current goals can be found in the Care Plan section)  Acute Rehab PT Goals Patient Stated Goal: to go home PT Goal Formulation: With patient/family Time For Goal Achievement: 06/14/20 Potential to Achieve Goals: Fair    Frequency Min 3X/week   Barriers to discharge        Co-evaluation               AM-PAC PT "6 Clicks" Mobility  Outcome Measure Help needed turning from your back to your side while in a flat bed without using bedrails?: A Little Help needed moving from lying on your back to sitting on the side of a flat bed without using bedrails?: A Little Help needed moving to and from a bed to a chair (including a wheelchair)?: A Little Help needed standing up from a chair using your arms (e.g., wheelchair or bedside chair)?: A Little Help needed to walk in hospital room?: A Little Help needed climbing 3-5 steps with a railing? : A Lot 6 Click Score: 17    End of Session Equipment Utilized During Treatment: Gait belt Activity Tolerance: Patient tolerated treatment well Patient left: in chair;with family/visitor present Nurse Communication: Mobility status PT Visit Diagnosis: Unsteadiness on feet (R26.81);Difficulty in walking, not elsewhere classified (R26.2)    Time: 0964-3838 PT Time Calculation (min) (ACUTE ONLY): 29 min   Charges:   PT Evaluation $PT Eval Low Complexity: 1 Low PT Treatments $Gait Training: 8-22 mins        Tresa Endo PT Acute  Rehabilitation Services Pager [phone] Office [phone]   Claretha Cooper 05/31/2020, 5:10 PM

## 2020-05-31 NOTE — Progress Notes (Signed)
Daily Progress Note   Patient Name: Wesley Snyder.       Date: 05/31/2020 DOB: 1932-07-17  Age: 87 y.o. MRN#: [id number] Attending Physician: Raynelle Bring, MD Primary Care Physician: Majel Homer, [place] Admit Date: 05/27/2020  Reason for Consultation/Follow-up: Establishing goals of care  Subjective:  awake alert, resting in bed, he is able to feed himself breakfast, ate almost 100% of his meals. Wife and son at bedside.   Length of Stay: 4  Current Medications: Scheduled Meds:  . (feeding supplement) PROSource Plus  30 mL Oral Daily  . Chlorhexidine Gluconate Cloth  6 each Topical Daily  . docusate sodium  100 mg Oral BID  . doxycycline  100 mg Oral Q12H  . feeding supplement (NEPRO CARB STEADY)  237 mL Oral Q24H  . sodium bicarbonate  1,300 mg Oral TID  . sodium zirconium cyclosilicate  10 g Oral Daily    Continuous Infusions: . dextrose 5 % and 0.45% NaCl 75 mL/hr at 05/31/20 0055    PRN Meds: acetaminophen, opium-belladonna, diphenhydrAMINE **OR** diphenhydrAMINE, HYDROcodone-acetaminophen, ondansetron, zolpidem  Physical Exam         Awake alert No distress Regular work of breathing No edema No focal deficits.   Vital Signs: BP (!) 108/54 (BP Location: Right Arm)   Pulse (!) 49   Temp 98.7 F (37.1 C) (Oral)   Resp 20   Ht 5\' 11"  (1.803 m)   Wt 63.2 kg   SpO2 94%   BMI 19.43 kg/m  SpO2: SpO2: 94 % O2 Device: O2 Device: Room Air O2 Flow Rate:    Intake/output summary:   Intake/Output Summary (Last 24 hours) at 05/31/2020 1142 Last data filed at 05/31/2020 1165 Gross per 24 hour  Intake 491.41 ml  Output 1925 ml  Net -1433.59 ml   LBM: Last BM Date: 05/30/20 (per pt) Baseline Weight: Weight: 63 kg Most recent weight: Weight: 63.2 kg      PPS  50% Palliative Assessment/Data:      Patient Active Problem List   Diagnosis Date Noted  . Malnutrition of moderate degree 05/29/2020  . Acute renal failure ([place]) 05/27/2020  . Renal pelvis transitional cell malignant neoplasm, right ([place]) 01/11/2017    Palliative Care Assessment & Plan   Patient Profile:    Assessment:  High grade urothelial carcinoma  of the right renal pelvis in solitary right kidney: H He also has AKI, CKD, renal services are following, remains admitted to urology service. He has refused nephroureterectomy (due to refusal to consider dialysis) and has been undergoing endoscopic treatment for palliative management over the past couple of years. it is noted that from urology standpoint, the current plan is to possibly consider ureteroscopy in the future if urine clears with only goal being to palliate and prevent worsening symptoms (bleeding/tumor progression).  PMT consulted for goals of care discussions.   Patient lives at home with wife. Has son. One of his important goals is to May delay any further procedures until after his grandson's graduation later this spring. His wife states that he uses walking cane at home, wants to be as independent as possible. They have a private aide at home.   Recommendations/Plan: Goals of care discussions undertaken, disposition options discussed. We will proceed with PT consult, consider out patient palliative involvement, discussed with patient, wife and son, see below, TOC notified.   Code Status:    Code Status Orders  (From admission, onward)         Start     Ordered   05/27/20 1525  Do not attempt resuscitation (DNR)  Continuous       Question Answer Comment  In the event of cardiac or respiratory ARREST Do not call a "code blue"   In the event of cardiac or respiratory ARREST Do not perform Intubation, CPR, defibrillation or ACLS   In the event of cardiac or respiratory ARREST Use medication by any route,  position, wound care, and other measures to relive pain and suffering. May use oxygen, suction and manual treatment of airway obstruction as needed for comfort.      05/27/20 1524        Code Status History    Date Active Date Inactive Code Status Order ID Comments User Context   05/27/2020 1523 05/27/2020 1524 Full Code [id number]  Raynelle Bring, MD Inpatient   01/11/2017 1732 01/12/2017 1741 Full Code [id number]  Raynelle Bring, MD Inpatient   Advance Care Planning Activity       Prognosis:   guarded.   Discharge Planning:  To Be Determined: home with home health versus SNF rehab, PT consulted, discussed with patient, wife and son in the room this morning. Recommend outpatient palliative care on discharge.   Care plan was discussed with  Patient wife and son. Also notified TOC colleague Juliann Pulse, PT consulted.   Thank you for allowing the Palliative Medicine Team to assist in the care of this patient.   Time In: 9 Time Out: 9.35 Total Time 35 Prolonged Time Billed  no       Greater than 50%  of this time was spent counseling and coordinating care related to the above assessment and plan.  Loistine Chance, MD  Please contact Palliative Medicine Team phone at [phone] for questions and concerns.

## 2020-05-31 NOTE — Progress Notes (Signed)
Patient ID: Wesley Snyder., male   DOB: [birth year], 87 y.o.   MRN: [id number]  Pt re-evaluated this afternoon.  He is eating and drinking very well now.  Has not ambulated.  PT ordered and planning to work with him this afternoon.    - Will saline lock IV considering very good po intake now - Will also d/c Foley (pt has incontinence at baseline due to history of radical prostatectomy in distant past) and so will not be at risk for retention - Appreciate palliative care and nephrology input - Will need disposition from PT assessment, nephrology follow up to be arranged, and outpatient palliative care service to be arranged.  If these things are accomplished and labs are stable or improved in AM tomorrow, he can likely be discharged to either SNF or home pending PT eval.

## 2020-05-31 NOTE — Progress Notes (Signed)
Patient ID: Wesley Snyder., male   DOB: Oct 12, 1932, 87 y.o.   MRN: [id number]  4 Days Post-Op Subjective: Rush Landmark is doing ok this morning without new complaints.  He has been eating and drinking better.  Renal function gradually improving over the weekend.  Palliative care evaluated Saturday and plans to see him again today.  Objective: Vital signs in last 24 hours: Temp:  [97.4 F (36.3 C)-98.7 F (37.1 C)] 98.7 F (37.1 C) (04/04 0626) Pulse Rate:  [49-52] 49 (04/04 0626) Resp:  [16-20] 20 (04/04 0626) BP: (108-138)/(54-74) 108/54 (04/04 0626) SpO2:  [94 %-99 %] 94 % (04/04 0626)  Intake/Output from previous day: 04/03 0701 - 04/04 0700 In: 1138.7 [P.O.:325; I.V.:719.8; IV Piggyback:93.9] Out: 1225 [Urine:1225] Intake/Output this shift: No intake/output data recorded.  Physical Exam:  General: Alert and oriented GU: Urine clearing in tubing but still with darker old blood in bag  Lab Results: Recent Labs    05/29/20 0704 05/30/20 0448 05/31/20 0457  HGB 7.9* 8.1* 7.6*  HCT 25.1* 25.7* 24.1*   BMET Recent Labs    05/30/20 0448 05/31/20 0457  NA 140  139 138  K 4.7  5.1 4.2  CL 115*  114* 114*  CO2 17*  17* 18*  GLUCOSE 97  98 93  BUN 70*  71* 58*  CREATININE 6.48*  6.55* 5.17*  CALCIUM 8.3*  8.3* 8.0*     Studies/Results: No results found.  Assessment/Plan: 1) High grade urothelial carcinoma of the right renal pelvis in solitary right kidney:  He has refused nephroureterectomy (due to refusal to consider dialysis) and has been undergoing endoscopic treatment for palliative management over the past couple of years.  Current plan (after discussion with family again this morning) is to possibly consider ureteroscopy in the future if urine clears with only goal being to palliate and prevent worsening symptoms (bleeding/tumor progression).  May delay any further procedures until after his grandson's graduation assuming he stabilizes clinically.  2)  Anemia:  He has had continued hematuria with Hgb of 6.7 last week.  He received 3 units of PRBCs in [place] last week and Hgb was over 10 yesterday and now drifting to 8.0.  This is likely a combination of hematuria/dilutional effect/renal failure.  Hgb is relatively stable over the past few days and his hematuria is more indicative of old blood.  Appreciate Dr. Jason Nest assistance and patient started on IV iron over the weekend.  Continue to monitor and consider transfusion if Hgb < 7.0.  Right now, his family would want a transfusion if that would improve his fatigue and quality of life.  3) AKI/CKD:  Baseline Cr is around 1.8-2.0 but recently renal function has worsened.  Gradual improvement over the weekend with Cr down from 8.9 Thursday to 5.2 after ureteral stent placement.  Appreciate Dr. Jason Nest input and recommendations.  Unless Dr. Justin Mend feels differently, I may consider Foley removal and possible discharge tomorrow if renal function still improving but he would need close nephrology follow up (he may wish to f/u in [place]).   4) Disposition:  Appreciate input from Palliative care.  They plan to see him and family again today to discuss goals of care and plan for discharge.  I think he is likely clinically stable for probably discharge tomorrow assuming Palliative care agrees and plan is in place for whether he wants to go home or assisted living, etc.   His prognosis is very much guarded but he is clinically improving and we  will do what we can to allow him to hopefully be at his grandson's graduation later this spring.    LOS: 4 days   Dutch Gray 05/31/2020, 8:30 AM

## 2020-05-31 NOTE — Progress Notes (Signed)
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  Progressive loss of renal function over the past 2 to 3 months.  Status post treatment in February 2022 baseline serum creatinine 1.5 to 2 mg/dL.  Does not wish to undergo dialysis and has been adamant in decision.  Underwent cystoscopy and retrograde/ureteroscopy/laser ablation of tumor and stent placement to the right urinary system.  Continues to have ongoing hematuria.  Pt seen in room. HOH. Family says his mentation has improved and overall he is doing a lot better that last 48 hrs or so. Creat continues to drop to 5 today.   Objective:  Vital signs in last 24 hours:  Temp:  [98.2 F (36.8 C)-98.7 F (37.1 C)] 98.2 F (36.8 C) (04/04 1412) Pulse Rate:  [49-51] 51 (04/04 1412) Resp:  [16-20] 16 (04/04 1412) BP: (108-150)/(54-75) 150/75 (04/04 1412) SpO2:  [94 %-98 %] 97 % (04/04 1412)  Weight change:  Filed Weights   05/27/20 1041 05/27/20 1518  Weight: 63 kg 63.2 kg    Intake/Output: I/O last 3 completed shifts: In: 1808.1 [P.O.:325; I.V.:1389.2; IV Piggyback:93.9] Out: 2425 [Urine:2425]   Intake/Output this shift:  Total I/O In: 200 [P.O.:200] Out: 700 [Urine:700]  Alert nondistressed CVS- RRR no murmurs rubs gallops RS- CTA no wheeze or rales ABD- BS present soft non-distended EXT- no edema   Basic Metabolic Panel: Recent Labs  Lab 05/28/20 0436 05/28/20 1237 05/29/20 0521 05/30/20 0448 05/31/20 0457  NA 137 136 142 140  139 138  K 5.5* 5.0 4.9 4.7  5.1 4.2  CL 112* 113* 117* 115*  114* 114*  CO2 15* 15* 15* 17*  17* 18*  GLUCOSE 127* 112* 84 97  98 93  BUN 80* 83* 77* 70*  71* 58*  CREATININE 8.19* 8.02* 7.39* 6.48*  6.55* 5.17*  CALCIUM 8.5* 8.3* 8.2* 8.3*  8.3* 8.0*  PHOS  --   --  5.6* 5.0* 4.2     Medications:   . dextrose 5 % and 0.45% NaCl 75  mL/hr at 05/31/20 1409   . (feeding supplement) PROSource Plus  30 mL Oral Daily  . Chlorhexidine Gluconate Cloth  6 each Topical Daily  . docusate sodium  100 mg Oral BID  . doxycycline  100 mg Oral Q12H  . feeding supplement (NEPRO CARB STEADY)  237 mL Oral Q24H  . sodium bicarbonate  1,300 mg Oral TID  . sodium zirconium cyclosilicate  10 g Oral Daily   acetaminophen, opium-belladonna, diphenhydrAMINE **OR** diphenhydrAMINE, HYDROcodone-acetaminophen, ondansetron, zolpidem  Assessment/ Plan:  1. AKI on CKD stage unknown - history of solitary kidney, status post nephrectomy left [facility].  With a baseline serum creatinine that has been about 1.5-2 mg/dL in 2021.  Since February 2022 there is been progressive loss of renal function.  History of prostate cancer status post open RRP [facility].  Urothelial carcinoma of the right kidney found to have a renal collecting system tumor 2018.  Right ureteroscopic tumor resection/laser ablation high-grade urothelial tumor February 2022. Creat improving here from 8 > 7 > 6 > 5 in the last several days here w/ IVF's as primary Rx. Would cont IVF"s. Pt refuses dialysis in general and this is known to the family who are supportive.  He would be okay for dc if eating and close to baseline MS from renal standpoint. Will follow.  2.  Anemia -  sp status transfusion 3 units packed red blood cells [place].    For iron deficiency have given IV iron 500 mg Venofer every 24 hours x2 for 1 gm total Fe load.  3.  Hyperkalemia - better  4.  Metabolic acidosis - cont na bicarb 5.  Coronary artery disease status post CABG   [name], MD 05/31/2020, 4:20 PM

## 2020-06-01 DIAGNOSIS — N179 Acute kidney failure, unspecified: Principal | ICD-10-CM

## 2020-06-01 DIAGNOSIS — Z7189 Other specified counseling: Secondary | ICD-10-CM

## 2020-06-01 DIAGNOSIS — Z515 Encounter for palliative care: Secondary | ICD-10-CM

## 2020-06-01 DIAGNOSIS — C689 Malignant neoplasm of urinary organ, unspecified: Secondary | ICD-10-CM | POA: Diagnosis not present

## 2020-06-01 LAB — HEMOGLOBIN AND HEMATOCRIT, BLOOD
HCT: 23.6 % — ABNORMAL LOW (ref 39.0–52.0)
Hemoglobin: 7.6 g/dL — ABNORMAL LOW (ref 13.0–17.0)

## 2020-06-01 LAB — RENAL FUNCTION PANEL
Albumin: 2.6 g/dL — ABNORMAL LOW (ref 3.5–5.0)
Anion gap: 9 (ref 5–15)
BUN: 58 mg/dL — ABNORMAL HIGH (ref 8–23)
CO2: 19 mmol/L — ABNORMAL LOW (ref 22–32)
Calcium: 8.4 mg/dL — ABNORMAL LOW (ref 8.9–10.3)
Chloride: 114 mmol/L — ABNORMAL HIGH (ref 98–111)
Creatinine, Ser: 5.28 mg/dL — ABNORMAL HIGH (ref 0.61–1.24)
GFR, Estimated: 10 mL/min — ABNORMAL LOW (ref >60)
Glucose, Bld: 89 mg/dL (ref 70–99)
Phosphorus: 4.4 mg/dL (ref 2.5–4.6)
Potassium: 4 mmol/L (ref 3.5–5.1)
Sodium: 142 mmol/L (ref 135–145)

## 2020-06-01 LAB — BASIC METABOLIC PANEL
Anion gap: 9 (ref 5–15)
BUN: 51 mg/dL — ABNORMAL HIGH (ref 8–23)
CO2: 21 mmol/L — ABNORMAL LOW (ref 22–32)
Calcium: 8.6 mg/dL — ABNORMAL LOW (ref 8.9–10.3)
Chloride: 114 mmol/L — ABNORMAL HIGH (ref 98–111)
Creatinine, Ser: 5.28 mg/dL — ABNORMAL HIGH (ref 0.61–1.24)
GFR, Estimated: 10 mL/min — ABNORMAL LOW (ref >60)
Glucose, Bld: 106 mg/dL — ABNORMAL HIGH (ref 70–99)
Potassium: 3.6 mmol/L (ref 3.5–5.1)
Sodium: 144 mmol/L (ref 135–145)

## 2020-06-01 NOTE — Progress Notes (Signed)
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  Progressive loss of renal function over the past 2 to 3 months.  Status post treatment in February 2022 baseline serum creatinine 1.5 to 2 mg/dL.  Does not wish to undergo dialysis and has been adamant in decision.  Underwent cystoscopy and retrograde/ureteroscopy/laser ablation of tumor and stent placement to the right urinary system.    Pt seen in room. Creat today ticked up slightly to 5.3. UOP continues to be good. No new c/o's.   Objective:  Vital signs in last 24 hours:  Temp:  [97.4 F (36.3 C)-98.1 F (36.7 C)] 97.4 F (36.3 C) (04/05 1411) Pulse Rate:  [50-66] 50 (04/05 1411) Resp:  [16-20] 16 (04/05 1411) BP: (150-166)/(63-90) 150/63 (04/05 1411) SpO2:  [96 %-99 %] 99 % (04/05 1411)  Weight change:  Filed Weights   05/27/20 1041 05/27/20 1518  Weight: 63 kg 63.2 kg    Intake/Output: I/O last 3 completed shifts: In: 320 [P.O.:320] Out: 950 [Urine:950]   Intake/Output this shift:  No intake/output data recorded.  Alert nondistressed CVS- RRR no murmurs rubs gallops RS- CTA no wheeze or rales ABD- BS present soft non-distended EXT- no edema   Basic Metabolic Panel: Recent Labs  Lab 05/29/20 0521 05/30/20 0448 05/31/20 0457 06/01/20 0523 06/01/20 1209  NA 142 140  139 138 142 144  K 4.9 4.7  5.1 4.2 4.0 3.6  CL 117* 115*  114* 114* 114* 114*  CO2 15* 17*  17* 18* 19* 21*  GLUCOSE 84 97  98 93 89 106*  BUN 77* 70*  71* 58* 58* 51*  CREATININE 7.39* 6.48*  6.55* 5.17* 5.28* 5.28*  CALCIUM 8.2* 8.3*  8.3* 8.0* 8.4* 8.6*  PHOS 5.6* 5.0* 4.2 4.4  --      Medications:   . dextrose 5 % and 0.45% NaCl 75 mL/hr at 05/31/20 1409   . (feeding supplement) PROSource Plus  30 mL Oral Daily  . Chlorhexidine Gluconate Cloth  6 each Topical Daily  . docusate sodium   100 mg Oral BID  . doxycycline  100 mg Oral Q12H  . feeding supplement (NEPRO CARB STEADY)  237 mL Oral Q24H  . sodium bicarbonate  1,300 mg Oral TID   acetaminophen, opium-belladonna, diphenhydrAMINE **OR** diphenhydrAMINE, HYDROcodone-acetaminophen, ondansetron, zolpidem  Assessment/ Plan:  1. AKI on CKD stage unknown - history of solitary kidney, status post nephrectomy left [facility].  With a baseline serum creatinine that has been about 1.5-2 mg/dL in 2021.  Since February 2022 there is been progressive loss of renal function.  History of prostate cancer status post open RRP [facility].  Urothelial carcinoma of the right kidney found to have a renal collecting system tumor 2018.  Right ureteroscopic tumor resection/laser ablation high-grade urothelial tumor February 2022. Creat improving here from 8 > 7 > 6 > 5 yesterday. Creat up slightly today. IVF"s resumed per urology.  Pt will not be doing dialysis, see prior notes. No further suggestions. Will sign off.  2.  Anemia - sp status transfusion 3 units packed red blood cells [place].    For iron deficiency have given IV iron 500 mg Venofer every 24 hours x2 for 1 gm total Fe load.  3.  Hyperkalemia - better  4.  Metabolic acidosis - cont na bicarb 5.  Coronary artery disease status post CABG   Rob  Mirinda Monte, MD 06/01/2020, 4:53 PM

## 2020-06-01 NOTE — Progress Notes (Signed)
Patient ID: Wesley Snyder., male   DOB: 14-Apr-1932, 87 y.o.   MRN: [id number]  5 Days Post-Op Subjective: Pt complained of increased pain in penis last night.  Has been incontinent since Foley removed (c/w baseline).  PT recommended HHPT.  He has not been eating or drinking as well overnight compared to early yesterday.  Objective: Vital signs in last 24 hours: Temp:  [97.7 F (36.5 C)-98.2 F (36.8 C)] 97.7 F (36.5 C) (04/05 0620) Pulse Rate:  [51-66] 55 (04/05 0620) Resp:  [16-20] 18 (04/05 0620) BP: (150-166)/(65-90) 161/65 (04/05 0620) SpO2:  [96 %-99 %] 96 % (04/05 0620)  Intake/Output from previous day: 04/04 0701 - 04/05 0700 In: 320 [P.O.:320] Out: 950 [Urine:950] Intake/Output this shift: No intake/output data recorded.  Physical Exam:  General: Alert and oriented CV: RRR Lungs: Clear Abd: Soft, ND, NT, No CVAT  Lab Results: Recent Labs    05/30/20 0448 05/31/20 0457 06/01/20 0523  HGB 8.1* 7.6* 7.6*  HCT 25.7* 24.1* 23.6*   BMET Recent Labs    05/31/20 0457 06/01/20 0523  NA 138 142  K 4.2 4.0  CL 114* 114*  CO2 18* 19*  GLUCOSE 93 89  BUN 58* 58*  CREATININE 5.17* 5.28*  CALCIUM 8.0* 8.4*     Studies/Results: No results found.  Assessment/Plan: 1) High grade urothelial carcinoma of the right renal pelvis in solitary right kidney: He has refused nephroureterectomy (due to refusal to consider dialysis) and has been undergoing endoscopic treatment for palliative management over the past couple of years. Current plan (after discussion with family again this morning) is to possibly consider ureteroscopy in the future if urine clears with only goal being to palliate and prevent worsening symptoms (bleeding/tumor progression).  May delay any further procedures until after his grandson's graduation assuming he stabilizes clinically.  2) Anemia: He has had continued hematuria with Hgb of 6.7 last week. He received 3 units of PRBCs in [place]  last week and Hgb was over 10 yesterday and now drifting to 8.0. This is likely a combination of hematuria/dilutional effect/renal failure.  Hgb is relatively stable over the past few days and his hematuria is more indicative of old blood.  Appreciate nephrology assistance and patient started on IV iron over the weekend. Continue to monitor and consider transfusion if Hgb <7.0.  Right now, his family would want a transfusion if that would improve his fatigue and quality of life.  Hgb stable today.   3) AKI/CKD: Baseline Cr is around 1.8-2.0 but recently renal function has worsened. Gradual improvement over the weekend with Cr down from 8.9 Thursday to 5.2 after ureteral stent placement.  Appreciate nephrology input and recommendations.  Renal function now stabilized. With poorer po intake overnight and Cr not improved today, will restart IVF hydration.  Will check PVR but he does not feel distended so unlikely to be obstruction.  Will follow and likely plan to keep him over the next 24 hrs to determine if renal function truly stabilizing.  This will help determine his prognosis.  Will need nephrology f/u arranged.  4) Disposition: Appreciate input from Palliative care.  PT recommended HHPT and order placed for this.   His prognosis is very much guarded but he is clinically stabilizing and we will do what we can to allow him to hopefully be at his grandson's graduation later this spring.  Anticipate discharge hopefully tomorrow if renal function not worsening.   LOS: 5 days   Dutch Gray 06/01/2020, 7:42  AM

## 2020-06-01 NOTE — TOC Progression Note (Signed)
Transition of Care (TOC) - Progression Note    Patient Details  Name: Wesley Snyder. MRN: [id number] Date of Birth: Dec 15, 1932  Transition of Care [facility]) CM/SW Contact  Joaquin Courts, RN Phone Number: 06/01/2020, 10:17 AM  Clinical Narrative:    CM spoke with spouse regarding dc planning.  Spouse is agreeable to HHPT services. Referral given to [facility] rep Sharmon Revere. CM spoke with spouse about recommendation for outpatient palliative services, spouse reports she will get back to CM about this.    Expected Discharge Plan: [place] Barriers to Discharge: Continued Medical Work up  Expected Discharge Plan and Services Expected Discharge Plan: [place]   Discharge Planning Services: CM Consult Post Acute Care Choice: [place] arrangements for the past 2 months: Single Family Home                           HH Arranged: PT [place]: [place] Date [place]: 06/01/20 Time [place]: 1016 Representative spoke with at [place]: [place] ([place]) Interventions    Readmission Risk Interventions No flowsheet data found.

## 2020-06-01 NOTE — Progress Notes (Signed)
Patient ID: Wesley Snyder., male   DOB: 1933-02-15, 87 y.o.   MRN: [id number]  Pt with mild nausea and small amount of emesis this morning.  Ate ok according to nursing.  Cr stable this afternoon.  PVR 2 cc.  [place] appears to be stabilizing clinically.  Will check his labs in the morning.  If stable, will plan to discharge home.  HHPT is arranged.  The family is still deciding about outpatient palliative care although I do think this would be a good idea for him.  Will need outpatient nephrology follow up set up per [facility].

## 2020-06-01 NOTE — TOC Progression Note (Signed)
Transition of Care (TOC) - Progression Note    Patient Details  Name: Woodfin Kiss. MRN: [id number] Date of Birth: 03/27/1932  Transition of Care [facility]) CM/SW Contact  Joaquin Courts, RN Phone Number: 06/01/2020, 2:30 PM  Clinical Narrative:    CM spoke with spouse re outpatient palliative, at this time spouse is not ready to make this decision, states will continue to think about this.  CM clarified that should they decide they want this service even after patient leaves hospital, pcp office can set this up as well.   Expected Discharge Plan: [place] Barriers to Discharge: Continued Medical Work up  Expected Discharge Plan and Services Expected Discharge Plan: [place]   Discharge Planning Services: CM Consult Post Acute Care Choice: [place] arrangements for the past 2 months: Single Family Home                           HH Arranged: PT [place]: [place] Date [place]: 06/01/20 Time [place]: 1016 Representative spoke with at [place]: [place] ([place]) Interventions    Readmission Risk Interventions No flowsheet data found.

## 2020-06-01 NOTE — TOC Progression Note (Signed)
Transition of Care (TOC) - Progression Note    Patient Details  Name: Wesley Snyder. MRN: [id number] Date of Birth: [birth year]  Transition of Care [facility]) CM/SW Contact  Joaquin Courts, RN Phone Number: 06/01/2020, 2:19 PM  Clinical Narrative:    CM attempted to follow up with spouse re: outpatient palliative care, unable to reach by phone.   Expected Discharge Plan: [place] Barriers to Discharge: Continued Medical Work up  Expected Discharge Plan and Services Expected Discharge Plan: [place]   Discharge Planning Services: CM Consult Post Acute Care Choice: [place] arrangements for the past 2 months: Single Family Home                           HH Arranged: PT [place]: [place] Date [place]: 06/01/20 Time [place]: 1016 Representative spoke with at [place]: [place] ([place]) Interventions    Readmission Risk Interventions No flowsheet data found.

## 2020-06-01 NOTE — Progress Notes (Signed)
Daily Progress Note   Patient Name: Wesley Snyder.       Date: 06/01/2020 DOB: May 26, 1932  Age: 87 y.o. MRN#: [id number] Attending Physician: Wesley Bring, MD Primary Care Physician: Wesley Snyder, [place] Admit Date: 05/27/2020  Reason for Consultation/Follow-up: Establishing goals of care  Subjective: Wesley Snyder was awake and alert on exam.  He was sitting in bed in no distress.  His son was at the bedside.  We reviewed clinical course overnight and plan for continued gentle medical interventions with hope his renal function will stabilize and he can be discharged home tomorrow.  He told me on at least 3 occasions throughout my encounter that he wants to be at home.  We reviewed plan for discharge home with home health and recommendation for outpatient palliative care to follow-up to see how he continues to progress overall in his nutrition, cognition, and functional status.  He has been clear in limitations of care including being adamant that he does not want dialysis moving forward.  His son expressed understanding that his clinical course once he gets home would likely dictate how to best care for him at home.  Overall, one of his primary goals is to be able to see his grandson graduate this spring.  Length of Stay: 5  Current Medications: Scheduled Meds:  . (feeding supplement) PROSource Plus  30 mL Oral Daily  . Chlorhexidine Gluconate Cloth  6 each Topical Daily  . docusate sodium  100 mg Oral BID  . doxycycline  100 mg Oral Q12H  . feeding supplement (NEPRO CARB STEADY)  237 mL Oral Q24H  . sodium bicarbonate  1,300 mg Oral TID    Continuous Infusions: . dextrose 5 % and 0.45% NaCl 75 mL/hr at 05/31/20 1409    PRN Meds: acetaminophen, opium-belladonna, diphenhydrAMINE **OR**  diphenhydrAMINE, HYDROcodone-acetaminophen, ondansetron, zolpidem  Physical Exam         Awake alert No distress Regular work of breathing No edema No focal deficits.   Vital Signs: BP (!) 161/65 (BP Location: Right Arm)   Pulse (!) 55   Temp 97.7 F (36.5 C) (Oral)   Resp 18   Ht 5\' 11"  (1.803 m)   Wt 63.2 kg   SpO2 96%   BMI 19.43 kg/m  SpO2: SpO2: 96 % O2 Device: O2 Device: Room  Air O2 Flow Rate:    Intake/output summary:   Intake/Output Summary (Last 24 hours) at 06/01/2020 1335 Last data filed at 06/01/2020 0646 Gross per 24 hour  Intake 120 ml  Output 250 ml  Net -130 ml   LBM: Last BM Date: 05/31/20 (per pt) Baseline Weight: Weight: 63 kg Most recent weight: Weight: 63.2 kg      PPS 50% Palliative Assessment/Data:      Patient Active Problem List   Diagnosis Date Noted  . Malnutrition of moderate degree 05/29/2020  . Acute renal failure ([place]) 05/27/2020  . Renal pelvis transitional cell malignant neoplasm, right ([place]) 01/11/2017    Palliative Care Assessment & Plan   Patient Profile:    Assessment:  High grade urothelial carcinoma of the right renal pelvis in solitary right kidney: H He also has AKI, CKD, renal services are following, remains admitted to urology service. He has refused nephroureterectomy (due to refusal to consider dialysis) and has been undergoing endoscopic treatment for palliative management over the past couple of years. it is noted that from urology standpoint, the current plan is to possibly consider ureteroscopy in the future if urine clears with only goal being to palliate and prevent worsening symptoms (bleeding/tumor progression).  PMT consulted for goals of care discussions.   Patient lives at home with wife. Has son. One of his important goals is to May delay any further procedures until after his grandson's graduation later this spring. His wife states that he uses walking cane at home, wants to be as independent as possible.  They have a private aide at home.   Recommendations/Plan: -DNR/DNI -Plan for discharge home with home health PT when he is medically ready. -Also recommended outpatient palliative care to follow (if services are available in his home area as [place] out of state) to continue conversation regarding goals of care based upon his clinical course of the next few weeks.  Code Status:    Code Status Orders  (From admission, onward)         Start     Ordered   05/27/20 1525  Do not attempt resuscitation (DNR)  Continuous       Question Answer Comment  In the event of cardiac or respiratory ARREST Do not call a "code blue"   In the event of cardiac or respiratory ARREST Do not perform Intubation, CPR, defibrillation or ACLS   In the event of cardiac or respiratory ARREST Use medication by any route, position, wound care, and other measures to relive pain and suffering. May use oxygen, suction and manual treatment of airway obstruction as needed for comfort.      05/27/20 1524        Code Status History    Date Active Date Inactive Code Status Order ID Comments User Context   05/27/2020 1523 05/27/2020 1524 Full Code [id number]  Wesley Bring, MD Inpatient   01/11/2017 1732 01/12/2017 1741 Full Code [id number]  Wesley Bring, MD Inpatient   Advance Care Planning Activity       Prognosis:   guarded.   Discharge Planning:  Home with home health and recommend outpatient palliative care follow-up with can be arranged.  Care plan was discussed with patient and son  Thank you for allowing the Palliative Medicine Team to assist in the care of this patient.   Time In: 0930 Time Out: 1000 Total Time 30 Prolonged Time Billed  no    Greater than 50%  of this time was spent counseling  and coordinating care related to the above assessment and plan. Wesley Rough, MD  Please contact Palliative Medicine Team phone at [phone] for questions and concerns.

## 2020-06-02 LAB — RENAL FUNCTION PANEL
Albumin: 2.5 g/dL — ABNORMAL LOW (ref 3.5–5.0)
Anion gap: 9 (ref 5–15)
BUN: 57 mg/dL — ABNORMAL HIGH (ref 8–23)
CO2: 21 mmol/L — ABNORMAL LOW (ref 22–32)
Calcium: 8.2 mg/dL — ABNORMAL LOW (ref 8.9–10.3)
Chloride: 112 mmol/L — ABNORMAL HIGH (ref 98–111)
Creatinine, Ser: 5.05 mg/dL — ABNORMAL HIGH (ref 0.61–1.24)
GFR, Estimated: 11 mL/min — ABNORMAL LOW (ref >60)
Glucose, Bld: 84 mg/dL (ref 70–99)
Phosphorus: 4.3 mg/dL (ref 2.5–4.6)
Potassium: 3.7 mmol/L (ref 3.5–5.1)
Sodium: 142 mmol/L (ref 135–145)

## 2020-06-02 NOTE — Progress Notes (Signed)
Patient ID: Wesley Snyder., male   DOB: 1932/08/30, 87 y.o.   MRN: [id number]  6 Days Post-Op Subjective: Pt without changes.  No new complaints.   Objective: Vital signs in last 24 hours: Temp:  [97.4 F (36.3 C)-98.7 F (37.1 C)] 98.7 F (37.1 C) (04/06 0636) Pulse Rate:  [50-55] 51 (04/06 0636) Resp:  [16-21] 16 (04/06 0636) BP: (126-165)/(63-68) 126/63 (04/06 0636) SpO2:  [94 %-99 %] 94 % (04/06 0636)  Intake/Output from previous day: No intake/output data recorded. Intake/Output this shift: No intake/output data recorded.  Physical Exam:  General: Alert and oriented Ext: NT, No erythema  Lab Results: Recent Labs    05/31/20 0457 06/01/20 0523  HGB 7.6* 7.6*  HCT 24.1* 23.6*   BMET Recent Labs    06/01/20 1209 06/02/20 0448  NA 144 142  K 3.6 3.7  CL 114* 112*  CO2 21* 21*  GLUCOSE 106* 84  BUN 51* 57*  CREATININE 5.28* 5.05*  CALCIUM 8.6* 8.2*     Studies/Results: No results found.  Assessment/Plan:  1) High grade urothelial carcinoma of the right renal pelvis in solitary right kidney: He has refused nephroureterectomy (due to refusal to consider dialysis) and has been undergoing endoscopic treatment for palliative management over the past couple of years.Current plan (after discussion with family again this morning) is to possibly consider ureteroscopy in the future if urine clears with only goal being to palliate and prevent worsening symptoms (bleeding/tumor progression). Will likely delay any further procedures until after his grandson's graduation assuming he stabilizes clinically.  2) Anemia: He has had continued hematuria with Hgb of 6.7 last week. He received 3 units of PRBCs in [place] last week and Hgb was over 10 yesterday and now drifting to 8.0. This is likely a combination of hematuria/dilutional effect/renal failure.Hgb is relatively stable over the past few days and his hematuria is more indicative of old blood. Appreciate  nephrology assistance and patient started on IV iron over the weekend.Continue to monitor and consider transfusion if Hgb <7.0.Right now, his family would want a transfusion if that would improve his fatigue and quality of life.  Hgb stable and will plan to recheck next year.   3) AKI/CKD: Baseline Cr is around 1.8-2.0 but recently renal function has worsened.Gradual improvement over the weekend with Cr down from 8.9 Thursday to 5.2 after ureteral stent placement. Appreciate nephrology input and recommendations. Renal function now stabilized.  Will set up outpatient nephrology follow up.  4) Disposition: Appreciate input from palliative care. Family still considering outpatient palliative care services and expressed more interest in that this morning.PT recommended HHPT and order placed for this. Will discharge home.    LOS: 6 days   Wesley Snyder 06/02/2020, 7:07 AM

## 2020-06-02 NOTE — Progress Notes (Signed)
06/02/2020  1009  Spoke with Randall Hiss [phone] and informed him that patient is waiting for him to come by and set up outpatient palliative care near his home and patient is ready for discharge.

## 2020-06-02 NOTE — TOC Transition Note (Signed)
Transition of Care [facility]) - CM/SW Discharge Note   Patient Details  Name: Wesley Snyder. MRN: [id number] Date of Birth: Oct 23, 1932  Transition of Care [facility]) CM/SW Contact:  Ross Ludwig, LCSW Phone Number: 06/02/2020, 10:42 AM   Clinical Narrative:     Patient will be going home with home health through [facility].  CSW signing off please reconsult with any other social work needs, home health agency has been notified of planned discharge.  Patient's wife decided she would like outpatient palliative to follow.  CSW contacted [facility] and Palliative, they can accept the referral.  CSW updated [facility] as well.  Patient discharging today, bedside nurse, and patient's wife made aware of planned discharge for today.    Final next level of care: [place] Barriers to Discharge: Barriers Resolved   Patient Goals and CMS Choice Patient states their goals for this hospitalization and ongoing recovery are:: To go home with home health services CMS Medicare.gov Compare Post Acute Care list provided to:: Patient Represenative (must comment) Choice offered to / list presented to : Spouse  Discharge Placement  Patient discharging home with wife today.                     Discharge Plan and Services   Discharge Planning Services: CM Consult Post Acute Care Choice: Home Health                    HH Arranged: PT [place]: [employer] Date [place]: 06/02/20 Time [place]: 1036 Representative spoke with at [place]: Malachy Mood at [facility] Interventions     Readmission Risk Interventions No flowsheet data found.

## 2020-06-02 NOTE — Plan of Care (Signed)
  Problem: Nutrition: Goal: Adequate nutrition will be maintained Outcome: Progressing   Problem: Pain Managment: Goal: General experience of comfort will improve Outcome: Progressing   Problem: Safety: Goal: Ability to remain free from injury will improve Outcome: Progressing   

## 2020-06-02 NOTE — Discharge Summary (Signed)
  Date of admission: 05/27/2020  Date of discharge: 06/02/2020  Admission diagnosis: Urothelial carcinoma of the right kidney, AKI, hematuria, solitary right kidney  Discharge diagnosis: Same as above  Secondary diagnosis: Coronary artery disease  History and Physical: For full details, please see admission history and physical. Briefly, Wesley Snyder. is a 87 y.o. year old patient with high grade urothelial carcinoma of the right kidney.  He has refused nephroureterectomy and has been managed with endoscopic treatment. He presented to the hospital on 05/27/20 with AKI with a Cr of 8.9.   Hospital Course: He underwent right ureteroscopy but visualization was poor and his tumor could not be treated and he underwent right ureteral stent.  Due to his AKI, he was admitted and nephrology was consulted.  His Cr gradually decreased to 5.0 and stabilized. He reiterated his decision that he did not want to have dialysis.  Palliative care was consulted to help define care goals and he did request to be made DNR.  He continued to have hematuria but his Hgb stabilized at 7.6.  He was discharged home on 06/02/20 to home with home health physical therapy.  Laboratory values: Recent Labs    05/31/20 0457 06/01/20 0523  HGB 7.6* 7.6*  HCT 24.1* 23.6*   Recent Labs    06/01/20 1209 06/02/20 0448  CREATININE 5.28* 5.05*    Disposition: Home  Discharge instruction: The patient was instructed to be ambulatory but told to refrain from heavy lifting, strenuous activity, or driving.  Discharge medications:  Allergies as of 06/02/2020   No Known Allergies     Medication List    TAKE these medications   loperamide 2 MG tablet Commonly known as: IMODIUM A-D Take 2 mg by mouth 4 (four) times daily as needed for diarrhea or loose stools.   multivitamin with minerals Tabs tablet Take 1 tablet by mouth daily.       Followup:   Follow-up Information    Raynelle Bring, MD Follow up.   Specialty:  Urology Why: 06/23/20 at 10:15 AM   but will also schedule an appt for next week. Contact information: [place] [address] [phone]        [facility] Follow up.   Why: agency will provide home health therapy (services provided by [facility]) Contact information: [place] [address] [phone]        [place] [place] Follow up.   Why: Will call to schedule follow up Contact information: [address] [place] [address] [phone]

## 2020-08-27 DEATH — deceased
